# Patient Record
Sex: Female | Born: 1950 | Race: White | Hispanic: No | Marital: Married | Smoking: Current every day smoker
Health system: Southern US, Community
[De-identification: ages and names within clinical notes are randomized; demographics above are authoritative.]

## PROBLEM LIST (undated history)

## (undated) DIAGNOSIS — F419 Anxiety disorder, unspecified: Secondary | ICD-10-CM

## (undated) DIAGNOSIS — K219 Gastro-esophageal reflux disease without esophagitis: Secondary | ICD-10-CM

## (undated) DIAGNOSIS — J45909 Unspecified asthma, uncomplicated: Secondary | ICD-10-CM

## (undated) DIAGNOSIS — G629 Polyneuropathy, unspecified: Secondary | ICD-10-CM

## (undated) DIAGNOSIS — Z8719 Personal history of other diseases of the digestive system: Secondary | ICD-10-CM

## (undated) DIAGNOSIS — C679 Malignant neoplasm of bladder, unspecified: Secondary | ICD-10-CM

## (undated) DIAGNOSIS — D759 Disease of blood and blood-forming organs, unspecified: Secondary | ICD-10-CM

## (undated) DIAGNOSIS — Z9989 Dependence on other enabling machines and devices: Secondary | ICD-10-CM

## (undated) DIAGNOSIS — R52 Pain, unspecified: Secondary | ICD-10-CM

## (undated) DIAGNOSIS — C801 Malignant (primary) neoplasm, unspecified: Secondary | ICD-10-CM

## (undated) DIAGNOSIS — M1990 Unspecified osteoarthritis, unspecified site: Secondary | ICD-10-CM

## (undated) DIAGNOSIS — R410 Disorientation, unspecified: Secondary | ICD-10-CM

## (undated) DIAGNOSIS — G709 Myoneural disorder, unspecified: Secondary | ICD-10-CM

## (undated) DIAGNOSIS — F329 Major depressive disorder, single episode, unspecified: Secondary | ICD-10-CM

## (undated) DIAGNOSIS — Z9981 Dependence on supplemental oxygen: Secondary | ICD-10-CM

## (undated) DIAGNOSIS — R0601 Orthopnea: Secondary | ICD-10-CM

## (undated) DIAGNOSIS — I10 Essential (primary) hypertension: Secondary | ICD-10-CM

## (undated) DIAGNOSIS — D649 Anemia, unspecified: Secondary | ICD-10-CM

## (undated) DIAGNOSIS — J449 Chronic obstructive pulmonary disease, unspecified: Secondary | ICD-10-CM

## (undated) DIAGNOSIS — M797 Fibromyalgia: Secondary | ICD-10-CM

## (undated) DIAGNOSIS — D751 Secondary polycythemia: Secondary | ICD-10-CM

## (undated) DIAGNOSIS — F32A Depression, unspecified: Secondary | ICD-10-CM

## (undated) DIAGNOSIS — I2510 Atherosclerotic heart disease of native coronary artery without angina pectoris: Secondary | ICD-10-CM

## (undated) DIAGNOSIS — R519 Headache, unspecified: Secondary | ICD-10-CM

## (undated) DIAGNOSIS — Z95828 Presence of other vascular implants and grafts: Secondary | ICD-10-CM

## (undated) DIAGNOSIS — R42 Dizziness and giddiness: Secondary | ICD-10-CM

## (undated) DIAGNOSIS — R51 Headache: Secondary | ICD-10-CM

## (undated) DIAGNOSIS — G4730 Sleep apnea, unspecified: Secondary | ICD-10-CM

## (undated) DIAGNOSIS — J9690 Respiratory failure, unspecified, unspecified whether with hypoxia or hypercapnia: Secondary | ICD-10-CM

## (undated) DIAGNOSIS — N189 Chronic kidney disease, unspecified: Secondary | ICD-10-CM

## (undated) DIAGNOSIS — IMO0001 Reserved for inherently not codable concepts without codable children: Secondary | ICD-10-CM

## (undated) DIAGNOSIS — W3400XA Accidental discharge from unspecified firearms or gun, initial encounter: Secondary | ICD-10-CM

## (undated) HISTORY — PX: BREAST SURGERY: SHX581

## (undated) HISTORY — DX: Accidental discharge from unspecified firearms or gun, initial encounter: W34.00XA

## (undated) HISTORY — DX: Dizziness and giddiness: R42

## (undated) HISTORY — DX: Disorientation, unspecified: R41.0

## (undated) HISTORY — PX: ARM WOUND REPAIR / CLOSURE: SUR1141

## (undated) HISTORY — PX: CYSTOSTOMY W/ BLADDER BIOPSY: SHX1431

## (undated) HISTORY — PX: GRAFT APPLICATION: SHX6696

## (undated) HISTORY — PX: DILATION AND CURETTAGE OF UTERUS: SHX78

---

## 1978-08-12 HISTORY — PX: CHOLECYSTECTOMY: SHX55

## 1978-08-12 HISTORY — PX: APPENDECTOMY: SHX54

## 1978-08-12 HISTORY — PX: ABDOMINAL HYSTERECTOMY: SHX81

## 1979-08-13 DIAGNOSIS — W3400XA Accidental discharge from unspecified firearms or gun, initial encounter: Secondary | ICD-10-CM

## 1979-08-13 HISTORY — DX: Accidental discharge from unspecified firearms or gun, initial encounter: W34.00XA

## 1999-09-12 ENCOUNTER — Ambulatory Visit (HOSPITAL_COMMUNITY): Admission: RE | Admit: 1999-09-12 | Discharge: 1999-09-12 | Payer: Self-pay | Admitting: Gastroenterology

## 2002-06-11 ENCOUNTER — Ambulatory Visit (HOSPITAL_COMMUNITY): Admission: RE | Admit: 2002-06-11 | Discharge: 2002-06-11 | Payer: Self-pay | Admitting: Gastroenterology

## 2004-03-12 ENCOUNTER — Ambulatory Visit (HOSPITAL_COMMUNITY): Admission: RE | Admit: 2004-03-12 | Discharge: 2004-03-12 | Payer: Self-pay | Admitting: Gastroenterology

## 2004-05-03 ENCOUNTER — Other Ambulatory Visit: Payer: Self-pay

## 2004-06-18 ENCOUNTER — Ambulatory Visit (HOSPITAL_COMMUNITY): Admission: RE | Admit: 2004-06-18 | Discharge: 2004-06-18 | Payer: Self-pay | Admitting: Gastroenterology

## 2004-06-28 ENCOUNTER — Ambulatory Visit: Payer: Self-pay | Admitting: Internal Medicine

## 2004-07-25 ENCOUNTER — Ambulatory Visit: Payer: Self-pay | Admitting: Ophthalmology

## 2004-08-30 ENCOUNTER — Ambulatory Visit: Payer: Self-pay | Admitting: Internal Medicine

## 2005-05-14 ENCOUNTER — Encounter: Admission: RE | Admit: 2005-05-14 | Discharge: 2005-05-14 | Payer: Self-pay | Admitting: Neurosurgery

## 2005-05-30 ENCOUNTER — Inpatient Hospital Stay (HOSPITAL_COMMUNITY): Admission: RE | Admit: 2005-05-30 | Discharge: 2005-05-31 | Payer: Self-pay | Admitting: Neurosurgery

## 2005-06-20 ENCOUNTER — Encounter: Admission: RE | Admit: 2005-06-20 | Discharge: 2005-06-20 | Payer: Self-pay | Admitting: Neurosurgery

## 2007-01-13 ENCOUNTER — Ambulatory Visit: Payer: Self-pay | Admitting: Neurosurgery

## 2007-10-26 ENCOUNTER — Emergency Department: Payer: Self-pay | Admitting: Emergency Medicine

## 2007-10-29 ENCOUNTER — Other Ambulatory Visit: Payer: Self-pay

## 2007-10-29 ENCOUNTER — Inpatient Hospital Stay: Payer: Self-pay | Admitting: Rheumatology

## 2007-10-31 ENCOUNTER — Inpatient Hospital Stay: Payer: Self-pay | Admitting: Unknown Physician Specialty

## 2009-08-12 HISTORY — PX: COLONOSCOPY: SHX174

## 2010-04-05 ENCOUNTER — Ambulatory Visit: Payer: Self-pay | Admitting: Gastroenterology

## 2010-04-09 LAB — PATHOLOGY REPORT

## 2010-09-02 ENCOUNTER — Encounter: Payer: Self-pay | Admitting: Gastroenterology

## 2010-12-21 ENCOUNTER — Ambulatory Visit: Payer: Self-pay | Admitting: Internal Medicine

## 2011-01-11 ENCOUNTER — Ambulatory Visit: Payer: Self-pay | Admitting: Internal Medicine

## 2011-02-10 ENCOUNTER — Ambulatory Visit: Payer: Self-pay | Admitting: Internal Medicine

## 2011-03-13 ENCOUNTER — Ambulatory Visit: Payer: Self-pay | Admitting: Internal Medicine

## 2011-04-13 ENCOUNTER — Ambulatory Visit: Payer: Self-pay | Admitting: Internal Medicine

## 2011-04-18 ENCOUNTER — Ambulatory Visit: Payer: Self-pay | Admitting: Internal Medicine

## 2011-05-03 ENCOUNTER — Inpatient Hospital Stay: Payer: Self-pay | Admitting: Internal Medicine

## 2011-05-13 ENCOUNTER — Other Ambulatory Visit (HOSPITAL_COMMUNITY): Payer: Self-pay | Admitting: Neurological Surgery

## 2011-05-13 DIAGNOSIS — M419 Scoliosis, unspecified: Secondary | ICD-10-CM

## 2011-05-22 ENCOUNTER — Ambulatory Visit: Payer: Self-pay | Admitting: Internal Medicine

## 2011-06-12 ENCOUNTER — Ambulatory Visit (HOSPITAL_COMMUNITY)
Admission: RE | Admit: 2011-06-12 | Discharge: 2011-06-12 | Disposition: A | Discharge status: Home / Self Care | Payer: Medicare Other | Source: Ambulatory Visit | Attending: Neurological Surgery | Admitting: Neurological Surgery

## 2011-06-12 DIAGNOSIS — Z01818 Encounter for other preprocedural examination: Secondary | ICD-10-CM | POA: Insufficient documentation

## 2011-06-12 DIAGNOSIS — M419 Scoliosis, unspecified: Secondary | ICD-10-CM

## 2011-06-12 MED ORDER — IOHEXOL 180 MG/ML  SOLN
15.0000 mL | Freq: Once | INTRAMUSCULAR | Status: AC | PRN
  Administered 2011-06-12: 15 mL via INTRATHECAL

## 2011-06-13 ENCOUNTER — Ambulatory Visit: Payer: Self-pay | Admitting: Internal Medicine

## 2011-07-19 ENCOUNTER — Ambulatory Visit: Payer: Self-pay | Admitting: Internal Medicine

## 2011-08-08 ENCOUNTER — Ambulatory Visit: Payer: Self-pay | Admitting: Urology

## 2011-08-13 ENCOUNTER — Ambulatory Visit: Payer: Self-pay | Admitting: Internal Medicine

## 2011-10-29 ENCOUNTER — Ambulatory Visit: Payer: Self-pay | Admitting: Internal Medicine

## 2011-11-11 ENCOUNTER — Ambulatory Visit: Payer: Self-pay | Admitting: Internal Medicine

## 2012-01-07 ENCOUNTER — Ambulatory Visit: Payer: Self-pay | Admitting: Internal Medicine

## 2012-01-07 LAB — URINALYSIS, COMPLETE
Bacteria: NONE SEEN
Bilirubin,UR: NEGATIVE
Blood: NEGATIVE
Glucose,UR: NEGATIVE mg/dL (ref 0–75)
Ketone: NEGATIVE
Leukocyte Esterase: NEGATIVE
Nitrite: NEGATIVE
Ph: 6 (ref 4.5–8.0)
Protein: NEGATIVE
RBC,UR: 1 /HPF (ref 0–5)
Specific Gravity: 1.004 (ref 1.003–1.030)
Squamous Epithelial: 1
Transitional Epi: <1
WBC UR: <1 /HPF (ref 0–5)

## 2012-01-07 LAB — CBC CANCER CENTER
Basophil #: 0.1 x10 3/mm (ref 0.0–0.1)
Basophil %: 0.6 %
Eosinophil #: 0.2 x10 3/mm (ref 0.0–0.7)
Eosinophil %: 1.4 %
HCT: 48.4 % — ABNORMAL HIGH (ref 35.0–47.0)
HGB: 15.8 g/dL (ref 12.0–16.0)
Lymphocyte #: 4.8 x10 3/mm — ABNORMAL HIGH (ref 1.0–3.6)
Lymphocyte %: 36.9 %
MCH: 28.2 pg (ref 26.0–34.0)
MCHC: 32.7 g/dL (ref 32.0–36.0)
MCV: 86 fL (ref 80–100)
Monocyte #: 1.1 x10 3/mm — ABNORMAL HIGH (ref 0.2–0.9)
Monocyte %: 8.3 %
Neutrophil #: 6.8 x10 3/mm — ABNORMAL HIGH (ref 1.4–6.5)
Neutrophil %: 52.8 %
Platelet: 359 x10 3/mm (ref 150–440)
RBC: 5.61 10*6/uL — ABNORMAL HIGH (ref 3.80–5.20)
RDW: 16.2 % — ABNORMAL HIGH (ref 11.5–14.5)
WBC: 12.9 x10 3/mm — ABNORMAL HIGH (ref 3.6–11.0)

## 2012-01-11 ENCOUNTER — Ambulatory Visit: Payer: Self-pay | Admitting: Internal Medicine

## 2012-01-15 ENCOUNTER — Ambulatory Visit: Payer: Self-pay | Admitting: Gastroenterology

## 2012-02-14 ENCOUNTER — Ambulatory Visit: Payer: Self-pay | Admitting: Gastroenterology

## 2012-02-19 LAB — PATHOLOGY REPORT

## 2012-04-27 ENCOUNTER — Emergency Department: Payer: Self-pay | Admitting: Emergency Medicine

## 2012-04-27 LAB — URINALYSIS, COMPLETE
Bacteria: NONE SEEN
Bilirubin,UR: NEGATIVE
Glucose,UR: NEGATIVE mg/dL (ref 0–75)
Hyaline Cast: 1
Leukocyte Esterase: NEGATIVE
Nitrite: NEGATIVE
Ph: 7 (ref 4.5–8.0)
Protein: 100
RBC,UR: 23 /HPF (ref 0–5)
Specific Gravity: 1.015 (ref 1.003–1.030)
Squamous Epithelial: <1
WBC UR: 2 /HPF (ref 0–5)

## 2012-04-27 LAB — COMPREHENSIVE METABOLIC PANEL
Albumin: 3.4 g/dL (ref 3.4–5.0)
Alkaline Phosphatase: 103 U/L (ref 50–136)
Anion Gap: 8 (ref 7–16)
BUN: 5 mg/dL — ABNORMAL LOW (ref 7–18)
Bilirubin,Total: 0.5 mg/dL (ref 0.2–1.0)
Calcium, Total: 9.3 mg/dL (ref 8.5–10.1)
Chloride: 93 mmol/L — ABNORMAL LOW (ref 98–107)
Co2: 27 mmol/L (ref 21–32)
Creatinine: 0.5 mg/dL — ABNORMAL LOW (ref 0.60–1.30)
EGFR (African American): >60
EGFR (Non-African Amer.): >60
Glucose: 113 mg/dL — ABNORMAL HIGH (ref 65–99)
Osmolality: 255 (ref 275–301)
Potassium: 3.8 mmol/L (ref 3.5–5.1)
SGOT(AST): 17 U/L (ref 15–37)
SGPT (ALT): 12 U/L (ref 12–78)
Sodium: 128 mmol/L — ABNORMAL LOW (ref 136–145)
Total Protein: 7.8 g/dL (ref 6.4–8.2)

## 2012-04-27 LAB — CK TOTAL AND CKMB (NOT AT ARMC)
CK, Total: 77 U/L (ref 21–215)
CK-MB: 2.4 ng/mL (ref 0.5–3.6)

## 2012-04-27 LAB — CBC
HCT: 42.1 % (ref 35.0–47.0)
HGB: 14.3 g/dL (ref 12.0–16.0)
MCH: 27.7 pg (ref 26.0–34.0)
MCHC: 33.9 g/dL (ref 32.0–36.0)
MCV: 82 fL (ref 80–100)
Platelet: 399 10*3/uL (ref 150–440)
RBC: 5.15 10*6/uL (ref 3.80–5.20)
RDW: 16.6 % — ABNORMAL HIGH (ref 11.5–14.5)
WBC: 15.6 10*3/uL — ABNORMAL HIGH (ref 3.6–11.0)

## 2012-04-27 LAB — TROPONIN I: Troponin-I: <0.02 ng/mL

## 2012-04-27 LAB — LIPASE, BLOOD: Lipase: 83 U/L (ref 73–393)

## 2012-07-06 ENCOUNTER — Ambulatory Visit: Payer: Self-pay | Admitting: Otolaryngology

## 2012-07-22 ENCOUNTER — Ambulatory Visit: Payer: Self-pay

## 2012-08-10 ENCOUNTER — Ambulatory Visit: Payer: Self-pay | Admitting: Otolaryngology

## 2012-08-19 ENCOUNTER — Ambulatory Visit: Payer: Self-pay | Admitting: Otolaryngology

## 2013-02-17 ENCOUNTER — Ambulatory Visit: Payer: Self-pay | Admitting: Ophthalmology

## 2013-05-28 ENCOUNTER — Other Ambulatory Visit: Payer: Self-pay

## 2013-06-02 LAB — CULTURE, BLOOD (SINGLE)

## 2013-06-11 ENCOUNTER — Ambulatory Visit: Payer: Self-pay | Admitting: Internal Medicine

## 2013-06-11 LAB — CBC CANCER CENTER
Basophil #: 0.1 x10 3/mm (ref 0.0–0.1)
Basophil %: 0.8 %
Eosinophil #: 0.3 x10 3/mm (ref 0.0–0.7)
Eosinophil %: 2.2 %
HCT: 50.3 % — ABNORMAL HIGH (ref 35.0–47.0)
HGB: 15.9 g/dL (ref 12.0–16.0)
Lymphocyte #: 3.8 x10 3/mm — ABNORMAL HIGH (ref 1.0–3.6)
Lymphocyte %: 27 %
MCH: 23.9 pg — ABNORMAL LOW (ref 26.0–34.0)
MCHC: 31.6 g/dL — ABNORMAL LOW (ref 32.0–36.0)
MCV: 76 fL — ABNORMAL LOW (ref 80–100)
Monocyte #: 1.2 x10 3/mm — ABNORMAL HIGH (ref 0.2–0.9)
Monocyte %: 8.8 %
Neutrophil #: 8.6 x10 3/mm — ABNORMAL HIGH (ref 1.4–6.5)
Neutrophil %: 61.2 %
Platelet: 359 x10 3/mm (ref 150–440)
RBC: 6.66 10*6/uL — ABNORMAL HIGH (ref 3.80–5.20)
RDW: 20.2 % — ABNORMAL HIGH (ref 11.5–14.5)
WBC: 14 x10 3/mm — ABNORMAL HIGH (ref 3.6–11.0)

## 2013-06-12 ENCOUNTER — Ambulatory Visit: Payer: Self-pay | Admitting: Internal Medicine

## 2013-06-30 LAB — CBC CANCER CENTER
Basophil #: 0.1 x10 3/mm (ref 0.0–0.1)
Basophil %: 0.7 %
Eosinophil #: 0.1 x10 3/mm (ref 0.0–0.7)
Eosinophil %: 0.7 %
HCT: 47.4 % — ABNORMAL HIGH (ref 35.0–47.0)
HGB: 15.2 g/dL (ref 12.0–16.0)
Lymphocyte #: 3.4 x10 3/mm (ref 1.0–3.6)
Lymphocyte %: 20.8 %
MCH: 24.3 pg — ABNORMAL LOW (ref 26.0–34.0)
MCHC: 32.1 g/dL (ref 32.0–36.0)
MCV: 76 fL — ABNORMAL LOW (ref 80–100)
Monocyte #: 1.4 x10 3/mm — ABNORMAL HIGH (ref 0.2–0.9)
Monocyte %: 8.6 %
Neutrophil #: 11.4 x10 3/mm — ABNORMAL HIGH (ref 1.4–6.5)
Neutrophil %: 69.2 %
Platelet: 372 x10 3/mm (ref 150–440)
RBC: 6.27 10*6/uL — ABNORMAL HIGH (ref 3.80–5.20)
RDW: 20.4 % — ABNORMAL HIGH (ref 11.5–14.5)
WBC: 16.5 x10 3/mm — ABNORMAL HIGH (ref 3.6–11.0)

## 2013-07-14 ENCOUNTER — Ambulatory Visit: Payer: Self-pay | Admitting: Internal Medicine

## 2013-08-12 ENCOUNTER — Ambulatory Visit: Payer: Self-pay | Admitting: Internal Medicine

## 2013-09-13 ENCOUNTER — Ambulatory Visit: Payer: Self-pay | Admitting: Internal Medicine

## 2014-01-22 DIAGNOSIS — Z8669 Personal history of other diseases of the nervous system and sense organs: Secondary | ICD-10-CM | POA: Insufficient documentation

## 2014-01-22 DIAGNOSIS — F329 Major depressive disorder, single episode, unspecified: Secondary | ICD-10-CM | POA: Insufficient documentation

## 2014-01-22 DIAGNOSIS — R739 Hyperglycemia, unspecified: Secondary | ICD-10-CM | POA: Insufficient documentation

## 2014-01-22 DIAGNOSIS — F419 Anxiety disorder, unspecified: Secondary | ICD-10-CM

## 2014-01-22 DIAGNOSIS — E785 Hyperlipidemia, unspecified: Secondary | ICD-10-CM | POA: Insufficient documentation

## 2014-01-22 DIAGNOSIS — J449 Chronic obstructive pulmonary disease, unspecified: Secondary | ICD-10-CM | POA: Insufficient documentation

## 2014-01-22 DIAGNOSIS — Z87448 Personal history of other diseases of urinary system: Secondary | ICD-10-CM | POA: Insufficient documentation

## 2014-01-22 DIAGNOSIS — K529 Noninfective gastroenteritis and colitis, unspecified: Secondary | ICD-10-CM | POA: Insufficient documentation

## 2014-01-22 DIAGNOSIS — Z87718 Personal history of other specified (corrected) congenital malformations of genitourinary system: Secondary | ICD-10-CM | POA: Insufficient documentation

## 2014-01-22 DIAGNOSIS — D72829 Elevated white blood cell count, unspecified: Secondary | ICD-10-CM | POA: Insufficient documentation

## 2014-01-22 DIAGNOSIS — M9979 Connective tissue and disc stenosis of intervertebral foramina of abdomen and other regions: Secondary | ICD-10-CM | POA: Insufficient documentation

## 2014-01-22 DIAGNOSIS — M069 Rheumatoid arthritis, unspecified: Secondary | ICD-10-CM | POA: Insufficient documentation

## 2014-01-22 DIAGNOSIS — F32A Depression, unspecified: Secondary | ICD-10-CM | POA: Insufficient documentation

## 2014-02-17 ENCOUNTER — Inpatient Hospital Stay: Payer: Self-pay | Admitting: Internal Medicine

## 2014-02-17 LAB — CBC
HCT: 39 % (ref 35.0–47.0)
HGB: 12.6 g/dL (ref 12.0–16.0)
MCH: 24.2 pg — ABNORMAL LOW (ref 26.0–34.0)
MCHC: 32.3 g/dL (ref 32.0–36.0)
MCV: 75 fL — ABNORMAL LOW (ref 80–100)
Platelet: 378 10*3/uL (ref 150–440)
RBC: 5.19 10*6/uL (ref 3.80–5.20)
RDW: 18.5 % — ABNORMAL HIGH (ref 11.5–14.5)
WBC: 17.4 10*3/uL — ABNORMAL HIGH (ref 3.6–11.0)

## 2014-02-17 LAB — COMPREHENSIVE METABOLIC PANEL
Albumin: 2.5 g/dL — ABNORMAL LOW (ref 3.4–5.0)
Alkaline Phosphatase: 107 U/L
Anion Gap: 10 (ref 7–16)
BUN: 28 mg/dL — ABNORMAL HIGH (ref 7–18)
Bilirubin,Total: 0.3 mg/dL (ref 0.2–1.0)
Calcium, Total: 8.8 mg/dL (ref 8.5–10.1)
Chloride: 88 mmol/L — ABNORMAL LOW (ref 98–107)
Co2: 24 mmol/L (ref 21–32)
Creatinine: 1.61 mg/dL — ABNORMAL HIGH (ref 0.60–1.30)
EGFR (African American): 39 — ABNORMAL LOW
EGFR (Non-African Amer.): 33 — ABNORMAL LOW
Glucose: 110 mg/dL — ABNORMAL HIGH (ref 65–99)
Osmolality: 252 (ref 275–301)
Potassium: 4.6 mmol/L (ref 3.5–5.1)
SGOT(AST): 20 U/L (ref 15–37)
SGPT (ALT): 12 U/L (ref 12–78)
Sodium: 122 mmol/L — ABNORMAL LOW (ref 136–145)
Total Protein: 7.3 g/dL (ref 6.4–8.2)

## 2014-02-17 LAB — PROTIME-INR
INR: 1.1
Prothrombin Time: 13.9 secs (ref 11.5–14.7)

## 2014-02-17 LAB — PRO B NATRIURETIC PEPTIDE: B-Type Natriuretic Peptide: 19243 pg/mL — ABNORMAL HIGH (ref 0–125)

## 2014-02-17 LAB — CK-MB
CK-MB: 4.9 ng/mL — ABNORMAL HIGH (ref 0.5–3.6)
CK-MB: 3.6 ng/mL (ref 0.5–3.6)

## 2014-02-17 LAB — TROPONIN I
Troponin-I: 0.88 ng/mL — ABNORMAL HIGH
Troponin-I: 0.94 ng/mL — ABNORMAL HIGH

## 2014-02-17 LAB — CK TOTAL AND CKMB (NOT AT ARMC)
CK, Total: 164 U/L
CK-MB: 4.8 ng/mL — ABNORMAL HIGH (ref 0.5–3.6)

## 2014-02-17 LAB — APTT: Activated PTT: 38.8 secs — ABNORMAL HIGH (ref 23.6–35.9)

## 2014-02-18 LAB — COMPREHENSIVE METABOLIC PANEL
Albumin: 2.3 g/dL — ABNORMAL LOW (ref 3.4–5.0)
Alkaline Phosphatase: 94 U/L
Anion Gap: 7 (ref 7–16)
BILIRUBIN TOTAL: 0.5 mg/dL (ref 0.2–1.0)
BUN: 25 mg/dL — ABNORMAL HIGH (ref 7–18)
CO2: 29 mmol/L (ref 21–32)
Calcium, Total: 8.4 mg/dL — ABNORMAL LOW (ref 8.5–10.1)
Chloride: 89 mmol/L — ABNORMAL LOW (ref 98–107)
Creatinine: 1.22 mg/dL (ref 0.60–1.30)
EGFR (African American): 54 — ABNORMAL LOW
EGFR (Non-African Amer.): 47 — ABNORMAL LOW
Glucose: 124 mg/dL — ABNORMAL HIGH (ref 65–99)
OSMOLALITY: 257 (ref 275–301)
Potassium: 3.9 mmol/L (ref 3.5–5.1)
SGOT(AST): 14 U/L — ABNORMAL LOW (ref 15–37)
SGPT (ALT): 10 U/L — ABNORMAL LOW (ref 12–78)
SODIUM: 125 mmol/L — AB (ref 136–145)
TOTAL PROTEIN: 6.7 g/dL (ref 6.4–8.2)

## 2014-02-18 LAB — CBC WITH DIFFERENTIAL/PLATELET
BASOS ABS: 0.1 10*3/uL (ref 0.0–0.1)
Basophil %: 0.4 %
EOS ABS: 0.1 10*3/uL (ref 0.0–0.7)
Eosinophil %: 0.8 %
HCT: 39 % (ref 35.0–47.0)
HGB: 12.2 g/dL (ref 12.0–16.0)
Lymphocyte #: 2.2 10*3/uL (ref 1.0–3.6)
Lymphocyte %: 17.4 %
MCH: 23.8 pg — ABNORMAL LOW (ref 26.0–34.0)
MCHC: 31.4 g/dL — AB (ref 32.0–36.0)
MCV: 76 fL — AB (ref 80–100)
Monocyte #: 1.8 x10 3/mm — ABNORMAL HIGH (ref 0.2–0.9)
Monocyte %: 14.1 %
NEUTROS PCT: 67.3 %
Neutrophil #: 8.6 10*3/uL — ABNORMAL HIGH (ref 1.4–6.5)
Platelet: 349 10*3/uL (ref 150–440)
RBC: 5.15 10*6/uL (ref 3.80–5.20)
RDW: 18.4 % — ABNORMAL HIGH (ref 11.5–14.5)
WBC: 12.8 10*3/uL — AB (ref 3.6–11.0)

## 2014-02-18 LAB — HEPARIN LEVEL (UNFRACTIONATED)
Anti-Xa(Unfractionated): <0.1 IU/mL — ABNORMAL LOW (ref 0.30–0.70)
Anti-Xa(Unfractionated): 0.22 IU/mL — ABNORMAL LOW (ref 0.30–0.70)

## 2014-02-18 LAB — CK-MB: CK-MB: 3.5 ng/mL (ref 0.5–3.6)

## 2014-02-19 LAB — BASIC METABOLIC PANEL
Anion Gap: 8 (ref 7–16)
BUN: 14 mg/dL (ref 7–18)
CALCIUM: 8.6 mg/dL (ref 8.5–10.1)
Chloride: 90 mmol/L — ABNORMAL LOW (ref 98–107)
Co2: 28 mmol/L (ref 21–32)
Creatinine: 0.71 mg/dL (ref 0.60–1.30)
GLUCOSE: 117 mg/dL — AB (ref 65–99)
OSMOLALITY: 255 (ref 275–301)
Potassium: 4.3 mmol/L (ref 3.5–5.1)
Sodium: 126 mmol/L — ABNORMAL LOW (ref 136–145)

## 2014-02-19 LAB — CBC WITH DIFFERENTIAL/PLATELET
BASOS ABS: 0 10*3/uL (ref 0.0–0.1)
Basophil %: 0.1 %
EOS ABS: 0 10*3/uL (ref 0.0–0.7)
EOS PCT: 0 %
HCT: 36.7 % (ref 35.0–47.0)
HGB: 12 g/dL (ref 12.0–16.0)
LYMPHS ABS: 1 10*3/uL (ref 1.0–3.6)
Lymphocyte %: 5.1 %
MCH: 24.1 pg — ABNORMAL LOW (ref 26.0–34.0)
MCHC: 32.6 g/dL (ref 32.0–36.0)
MCV: 74 fL — ABNORMAL LOW (ref 80–100)
MONOS PCT: 5.2 %
Monocyte #: 1 x10 3/mm — ABNORMAL HIGH (ref 0.2–0.9)
NEUTROS ABS: 17.3 10*3/uL — AB (ref 1.4–6.5)
Neutrophil %: 89.6 %
Platelet: 398 10*3/uL (ref 150–440)
RBC: 4.97 10*6/uL (ref 3.80–5.20)
RDW: 18.7 % — ABNORMAL HIGH (ref 11.5–14.5)
WBC: 19.3 10*3/uL — ABNORMAL HIGH (ref 3.6–11.0)

## 2014-02-20 LAB — BASIC METABOLIC PANEL
Anion Gap: 6 — ABNORMAL LOW (ref 7–16)
BUN: 8 mg/dL (ref 7–18)
CALCIUM: 8.2 mg/dL — AB (ref 8.5–10.1)
CHLORIDE: 93 mmol/L — AB (ref 98–107)
Co2: 30 mmol/L (ref 21–32)
Creatinine: 0.73 mg/dL (ref 0.60–1.30)
EGFR (African American): >60
EGFR (Non-African Amer.): >60
GLUCOSE: 118 mg/dL — AB (ref 65–99)
Osmolality: 258 (ref 275–301)
Potassium: 4.7 mmol/L (ref 3.5–5.1)
Sodium: 129 mmol/L — ABNORMAL LOW (ref 136–145)

## 2014-02-20 LAB — CBC WITH DIFFERENTIAL/PLATELET
Basophil #: 0 10*3/uL (ref 0.0–0.1)
Basophil %: 0.2 %
Eosinophil #: 0 10*3/uL (ref 0.0–0.7)
Eosinophil %: 0 %
HCT: 38.1 % (ref 35.0–47.0)
HGB: 12.3 g/dL (ref 12.0–16.0)
Lymphocyte #: 1.2 10*3/uL (ref 1.0–3.6)
Lymphocyte %: 6.2 %
MCH: 23.8 pg — AB (ref 26.0–34.0)
MCHC: 32.3 g/dL (ref 32.0–36.0)
MCV: 74 fL — AB (ref 80–100)
MONO ABS: 1.2 x10 3/mm — AB (ref 0.2–0.9)
MONOS PCT: 6.3 %
NEUTROS ABS: 16.6 10*3/uL — AB (ref 1.4–6.5)
Neutrophil %: 87.3 %
Platelet: 412 10*3/uL (ref 150–440)
RBC: 5.18 10*6/uL (ref 3.80–5.20)
RDW: 18.8 % — ABNORMAL HIGH (ref 11.5–14.5)
WBC: 19.1 10*3/uL — ABNORMAL HIGH (ref 3.6–11.0)

## 2014-02-21 LAB — CBC WITH DIFFERENTIAL/PLATELET
Bands: 4 %
HCT: 41 % (ref 35.0–47.0)
HGB: 12.8 g/dL (ref 12.0–16.0)
LYMPHS PCT: 10 %
MCH: 23.3 pg — AB (ref 26.0–34.0)
MCHC: 31.2 g/dL — AB (ref 32.0–36.0)
MCV: 75 fL — ABNORMAL LOW (ref 80–100)
MONOS PCT: 10 %
Metamyelocyte: 3 %
Myelocyte: 2 %
Platelet: 446 10*3/uL — ABNORMAL HIGH (ref 150–440)
RBC: 5.5 10*6/uL — ABNORMAL HIGH (ref 3.80–5.20)
RDW: 18.7 % — AB (ref 11.5–14.5)
Segmented Neutrophils: 71 %
WBC: 16.2 10*3/uL — ABNORMAL HIGH (ref 3.6–11.0)

## 2014-02-21 LAB — BASIC METABOLIC PANEL
Anion Gap: 5 — ABNORMAL LOW (ref 7–16)
BUN: 9 mg/dL (ref 7–18)
CALCIUM: 8.6 mg/dL (ref 8.5–10.1)
CREATININE: 0.72 mg/dL (ref 0.60–1.30)
Chloride: 92 mmol/L — ABNORMAL LOW (ref 98–107)
Co2: 33 mmol/L — ABNORMAL HIGH (ref 21–32)
EGFR (Non-African Amer.): >60
GLUCOSE: 116 mg/dL — AB (ref 65–99)
Osmolality: 260 (ref 275–301)
Potassium: 4.4 mmol/L (ref 3.5–5.1)
SODIUM: 130 mmol/L — AB (ref 136–145)

## 2014-02-21 LAB — EXPECTORATED SPUTUM ASSESSMENT W REFEX TO RESP CULTURE

## 2014-02-22 LAB — BASIC METABOLIC PANEL
Anion Gap: 7 (ref 7–16)
BUN: 14 mg/dL (ref 7–18)
CHLORIDE: 89 mmol/L — AB (ref 98–107)
CREATININE: 0.75 mg/dL (ref 0.60–1.30)
Calcium, Total: 9.1 mg/dL (ref 8.5–10.1)
Co2: 34 mmol/L — ABNORMAL HIGH (ref 21–32)
EGFR (Non-African Amer.): >60
Glucose: 127 mg/dL — ABNORMAL HIGH (ref 65–99)
Osmolality: 263 (ref 275–301)
POTASSIUM: 4.1 mmol/L (ref 3.5–5.1)
Sodium: 130 mmol/L — ABNORMAL LOW (ref 136–145)

## 2014-02-22 LAB — CULTURE, BLOOD (SINGLE)

## 2014-02-22 LAB — CBC WITH DIFFERENTIAL/PLATELET
BANDS NEUTROPHIL: 9 %
Comment - H1-Com5: NORMAL
HCT: 43 % (ref 35.0–47.0)
HGB: 13.5 g/dL (ref 12.0–16.0)
LYMPHS PCT: 12 %
MCH: 23.3 pg — AB (ref 26.0–34.0)
MCHC: 31.3 g/dL — ABNORMAL LOW (ref 32.0–36.0)
MCV: 74 fL — ABNORMAL LOW (ref 80–100)
Metamyelocyte: 1 %
Monocytes: 7 %
PLATELETS: 447 10*3/uL — AB (ref 150–440)
RBC: 5.78 10*6/uL — ABNORMAL HIGH (ref 3.80–5.20)
RDW: 19.2 % — AB (ref 11.5–14.5)
SEGMENTED NEUTROPHILS: 71 %
WBC: 17.6 10*3/uL — AB (ref 3.6–11.0)

## 2014-05-27 ENCOUNTER — Ambulatory Visit: Payer: Self-pay | Admitting: Specialist

## 2014-07-06 ENCOUNTER — Encounter: Payer: Self-pay | Admitting: Internal Medicine

## 2014-08-22 DIAGNOSIS — M810 Age-related osteoporosis without current pathological fracture: Secondary | ICD-10-CM | POA: Insufficient documentation

## 2014-10-31 ENCOUNTER — Emergency Department: Payer: Self-pay | Admitting: Emergency Medicine

## 2014-11-14 ENCOUNTER — Ambulatory Visit
Admit: 2014-11-14 | Disposition: A | Discharge status: Home / Self Care | Payer: Self-pay | Attending: Urology | Admitting: Urology

## 2014-11-29 ENCOUNTER — Ambulatory Visit
Admit: 2014-11-29 | Disposition: A | Discharge status: Home / Self Care | Payer: Self-pay | Attending: Urology | Admitting: Urology

## 2014-11-29 LAB — CBC WITH DIFFERENTIAL/PLATELET
Basophil #: 0 10*3/uL (ref 0.0–0.1)
Basophil %: 0.4 %
EOS PCT: 2.5 %
Eosinophil #: 0.3 10*3/uL (ref 0.0–0.7)
HCT: 42.4 % (ref 35.0–47.0)
HGB: 14 g/dL (ref 12.0–16.0)
LYMPHS ABS: 3.6 10*3/uL (ref 1.0–3.6)
LYMPHS PCT: 35.8 %
MCH: 29.2 pg (ref 26.0–34.0)
MCHC: 33 g/dL (ref 32.0–36.0)
MCV: 89 fL (ref 80–100)
MONOS PCT: 9 %
Monocyte #: 0.9 x10 3/mm (ref 0.2–0.9)
Neutrophil #: 5.2 10*3/uL (ref 1.4–6.5)
Neutrophil %: 52.3 %
Platelet: 260 10*3/uL (ref 150–440)
RBC: 4.79 10*6/uL (ref 3.80–5.20)
RDW: 15.3 % — ABNORMAL HIGH (ref 11.5–14.5)
WBC: 10 10*3/uL (ref 3.6–11.0)

## 2014-12-02 NOTE — Op Note (Signed)
PATIENT NAME:  Rebecca Holland, Rebecca Holland MR#:  [id number] DATE OF BIRTH:  [birth year]  DATE OF PROCEDURE:  08/19/2012  PREOPERATIVE DIAGNOSIS:  Large left medial canthal nose surgery defect (2.5 x 2.0 cm).   POSTOPERATIVE DIAGNOSIS:  Large left medial canthal nose surgery defect (2.5 x 2.0 cm).   PROCEDURES:   1.  Full-thickness skin graft from the left supraclavicular fossa to the left medial canthal defect.  2.  Complex closure of left supraclavicular fossa.   SURGEON:  Janalee Dane, MD   DESCRIPTION OF PROCEDURE:  The patient was placed in the supine position on the operating room table. After general anesthesia had been induced, the patient was turned 90 degrees clockwise from anesthesia. The defect was marked with a template and then template was used to mark the harvest site in the left supraclavicular fossa. The areas were then prepped and draped in the usual fashion. A #15 blade was used to raise the full-thickness skin graft. The skin graft was placed in saline and undermining was carried out superiorly and inferiorly for approximately 2 cm in each direction. The wound was then closed with subdermal 4-0 Vicryl and the skin was closed with 6-0 nylon suture.   The full-thickness skin graft was then carefully defatted to thin it out as much as possible. The graft was then pie crusted with an 11 blade and sculpted to fit the defect including the medial aspect of the upper and lower eyelids. The graft was then carefully and meticulously sutured into place with interrupted 6-0 nylon and 5-0 silk sutures left long. The silk sutures were then tied over a Xeroform bolster and the left supraclavicular fossa wound was then dressed with bacitracin. The patient was then returned to anesthesia, allowed to emerge from anesthesia in the operating room and taken to the recovery room in stable condition. There were no complications. Estimated blood loss was 15 mL.    ____________________________ J. Nadeen Landau, MD jmc:si D: 08/20/2012 20:51:31 ET T: 08/20/2012 23:31:48 ET JOB#: [id number]  cc: Janalee Dane, MD, <Dictator> Nicholos Johns MD ELECTRONICALLY SIGNED 09/22/2012 19:22
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
## 2014-12-03 NOTE — Consult Note (Signed)
Chief Complaint:  Subjective/Chief Complaint Breathing better today. S/P fall last PM   VITAL SIGNS/ANCILLARY NOTES: **Vital Signs.:   11-Jul-15 08:03  Vital Signs Type Q 4hr  Temperature Temperature (F) 98.3  Celsius 36.8  Temperature Source oral  Pulse Pulse 93  Respirations Respirations 18  Systolic BP Systolic BP 130  Diastolic BP (mmHg) Diastolic BP (mmHg) 54  Mean BP 73  Pulse Ox % Pulse Ox % 93  Pulse Ox Activity Level  At rest  Oxygen Delivery 4L  *Intake and Output.:   11-Jul-15 12:41  Grand Totals Intake:  600 Output:      Net:  600 24 Hr.:  -115  IV (Primary)      In:  500  IV (Secondary)      In:  100   Brief Assessment:  GEN well developed, well nourished, no acute distress   Cardiac Regular   Respiratory normal resp effort  rhonchi   Gastrointestinal Normal   Gastrointestinal details normal Soft   Lab Results: Hepatic:  10-Jul-15 00:38   Bilirubin, Total 0.5  Alkaline Phosphatase 94 (45-117 NOTE: New Reference Range 07/02/13)  SGPT (ALT)  10  SGOT (AST)  14  Total Protein, Serum 6.7  Albumin, Serum  2.3  Routine Micro:  10-Jul-15 13:07   Micro Text Report SPUTUM CULTURE   COMMENT                   COLONIES TOO SMALL TO READ   GRAM STAIN                EXCELLENT SPECIMEN-90-100% WBC   GRAM STAIN                MANY WHITE BLOOD CELLS   GRAM STAIN                FEW GRAM VARIABLE RODS   GRAM STAIN             RARE GRAM POSITIVE COCCI IN PAIRS   ANTIBIOTIC                       Culture Comment COLONIES TOO SMALL TO READ  Gram Stain 1 EXCELLENT SPECIMEN-90-100% WBC  Gram Stain 2 MANY WHITE BLOOD CELLS  Gram Stain 3 FEW GRAM VARIABLE RODS  Gram Stain 4 RARE GRAM POSITIVE COCCI IN PAIRS  Result(s) reported on 19 Feb 2014 at 03:26PM.  General Ref:  10-Jul-15 09:20   Antithrombin III ========== TEST NAME ==========  ========= RESULTS =========  = REFERENCE RANGE =  ANTITHROMBIN III  Antithrombin III, Func/Immunol Antithrombin Activity            [   98 %                 ]            75-135 Antithrombin Antigen            [L59 %                 ]            75-130 A deficiency of antithrombin (AT), either congenital or acquired, increases the risk of thromboembolism. Congenital deficiencies of AT are very rare; acquired AT deficiency is much more common. Heparin therapy will lower AT levels. Levels are diminished in patients with disseminated intravascular coagulation (DIC) or sepsis and can be transiently diminished after an acute thrombotic event or AT deficiency can occur due  to diminished synthesis in patients with malnutrition and severe liver disease and urinary loss in nephrotic syndrome. Inflammatory bowel disease and drug therapy with L-asparaginse or fluorouracil can also produce diminished AT levels. It has been suggested that repeat blood sampling and testing after ruling out acquired causes of deficiency should be performed before the patient is diagnosed with congenital AT deficiency. **[facility]            No: [id number]         [address], [place],  [address]           Lindon Romp, MD         [phone]   Result(s) reported on 19 Feb 2014 at 05:48PM.  Routine Chem:  10-Jul-15 00:38   Glucose, Serum  124  BUN  25  Creatinine (comp) 1.22  Potassium, Serum 3.9  Chloride, Serum  89  CO2, Serum 29  Calcium (Total), Serum  8.4  Anion Gap 7  Osmolality (calc) 257  eGFR (African American)  54  eGFR (Non-African American)  47 (eGFR values <76m/min/1.73 m2 may be an indication of chronic kidney disease (CKD). Calculated eGFR is useful in patients with stable renal function. The eGFR calculation will not be reliable in acutely ill patients when serum creatinine is changing rapidly. It is not useful in  patients on dialysis. The eGFR calculation may not be applicable to patients at the low and high extremes of body sizes,  pregnant women, and vegetarians.)  Routine Hem:  10-Jul-15 00:38   WBC (CBC)  12.8  RBC (CBC) 5.15  Hemoglobin (CBC) 12.2  Hematocrit (CBC) 39.0  Platelet Count (CBC) 349  MCV  76  MCH  23.8  MCHC  31.4  RDW  18.4  Neutrophil % 67.3  Lymphocyte % 17.4  Monocyte % 14.1  Eosinophil % 0.8  Basophil % 0.4  Neutrophil #  8.6  Lymphocyte # 2.2  Monocyte #  1.8  Eosinophil # 0.1  Basophil # 0.1 (Result(s) reported on 18 Feb 2014 at 01:05AM.)   Radiology Results: XRay:    09-Jul-15 15:52, Chest Portable Single View  Chest Portable Single View   REASON FOR EXAM:    Chest Pain  COMMENTS:       PROCEDURE: DXR - DXR PORTABLE CHEST SINGLE VIEW  - Feb 17 2014  3:52PM     CLINICAL DATA:  Chest pain and shortness of breath.    EXAM:  PORTABLE CHEST - 1 VIEW    COMPARISON:  PA and lateral chest 05/06/2011.    FINDINGS:  The chest is hyperexpanded. There is no focal airspace disease or  effusion. Heart size is mildly enlarged.   IMPRESSION:  No acute finding.    Mild cardiomegaly.    Pulmonary hyperexpansion suggests with emphysema.      Electronically Signed    By: TInge RiseM.D.    On: 02/17/2014 15:55         Verified By: TRamond Dial M.D.,    11-Jul-15 06:31, Chest Portable Single View  Chest Portable Single View   REASON FOR EXAM:    SOB  COMMENTS:       PROCEDURE: DXR - DXR PORTABLE CHEST SINGLE VIEW  - Feb 19 2014  6:31AM     CLINICAL DATA:  Shortness of breath.    EXAM:  PORTABLE CHEST - 1 VIEW  COMPARISON:  Chest x-ray and chest CT dated 02/17/2014    FINDINGS:  There has been significant improvement in the consolidation in the  right lower lobe posterior medially. There is slight residual  atelectasis and infiltrate in the right lower lobe. The patient now  has hazy infiltrates in both upper lobes. Pulmonary vascularity is  more prominent than on the prior study. No effusion.     IMPRESSION:  1. Marked improvement in the  consolidative infiltrate at the right  lung base.  2. New hazy infiltrates in both upper lobes.  3. Increased pulmonary vascular prominence.      Electronically Signed    By: Rozetta Nunnery M.D.    On: 02/19/2014 08:18     Verified By: Larey Seat, M.D.,  [place]:    09-Jul-15 19:55, [place] Color Flow Doppler Low Extrem Bilat (Legs)  [place] Color Flow Doppler Low Extrem Bilat (Legs)   REASON FOR EXAM:    Edema  COMMENTS:       PROCEDURE: [place]  - US DOPPLER LOW EXTR BILATERAL  - Feb 17 2014  7:55PM     CLINICAL DATA:  Right leg pain and swelling.  Shortness of breath.    EXAM:  BILATERAL LOWER EXTREMITY VENOUS DOPPLER ULTRASOUND    TECHNIQUE:  Gray-scale sonography with graded compression, as well as color  Doppler and duplex ultrasound were performed to evaluate the lower  extremity deep venous systems from the level of the common femoral  vein and including the common femoral, femoral, profunda femoral,  popliteal and calf veins including the posterior tibial, peroneal  and gastrocnemius veins when visible. The superficial great  saphenous vein was also interrogated. Spectral Doppler was utilized  to evaluate flow at rest and with distal augmentation maneuvers in  the common femoral, femoral and popliteal veins.    COMPARISON:  None.    FINDINGS:  RIGHT LOWER EXTREMITY    Common Femoral Vein: No evidence of thrombus. Normal  compressibility, respiratory phasicity and response to augmentation.    Saphenofemoral Junction: No evidence of thrombus. Normal  compressibility and flow on color Doppler imaging.    Profunda Femoral Vein: No evidence of thrombus. Normal  compressibility and flow on color Doppler imaging.    Femoral Vein: No evidence of thrombus. Normal compressibility,  respiratory phasicity and response to augmentation.    Popliteal Vein: No evidence of thrombus. Normal compressibility,  respiratory phasicity and response to augmentation.    Calf Veins: No evidence  of thrombus. Normal compressibility and flow  on color Doppler imaging.    Superficial Great Saphenous Vein: No evidence of thrombus. Normal  compressibility and flow on color Doppler imaging.    Venous Reflux:  None.    Other Findings:  None.    LEFT LOWER EXTREMITY    Common Femoral Vein: No evidence of thrombus. Normal  compressibility, respiratory phasicity and response to augmentation.    Saphenofemoral Junction: No evidence of thrombus. Normal  compressibility and flow on color Doppler imaging.    Profunda Femoral Vein: No evidence of thrombus. Normal  compressibility and flow on color Doppler imaging.    Femoral Vein: No evidence of thrombus. Normal compressibility,  respiratory phasicity and response to augmentation.    Popliteal Vein: No evidence of thrombus. Normal compressibility,  respiratory phasicity and response to augmentation.    Calf Veins: No evidence of thrombus. Normal compressibility and flow  on color Doppler imaging.    Superficial Great Saphenous Vein: No evidence of thrombus.  Normal  compressibility and flow on color Doppler imaging.    Venous Reflux:  None.  Other Findings:  None.     IMPRESSION:  No evidence of deep venous thrombosis in either lower extremity.      Electronically Signed    By: Earle Gell M.D.    On: 02/17/2014 20:32         Verified By: Marlaine Hind, M.D.,  Cardiology:    09-Jul-15 15:21, ECG  Ventricular Rate 101  Atrial Rate 101  P-R Interval 130  QRS Duration 108  QT 322  QTc 417  P Axis 50  R Axis 96  T Axis 44  ECG interpretation   Sinus tachycardia  Rightward axis  Borderline ECG  When compared with ECG of 03-May-2011 19:19,  T wave inversion no longer evident in Anterior leads  ----------unconfirmed----------  Confirmed by OVERREAD, NOT ([id number]), editor PEARSON, BARBARA ([id number]) on 02/18/2014 12:58:48 PM  ECG     09-Jul-15 18:41, Echo Doppler  Echo Doppler   REASON FOR EXAM:      COMMENTS:        PROCEDURE: [facility] - ECHO DOPPLER COMPLETE(TRANSTHOR)  - Feb 17 2014  6:41PM     RESULT: Echocardiogram Report    Patient Name:   Rebecca Holland [facility] Date of Exam: 02/17/2014  Medical Rec #:  [id number]               Custom1:  Date of Birth:  [birth year]             Height:       69.0 in  Patient Age:    34 years             Weight:       170.0 lb  Patient Gender: F                    BSA:          1.93 m??    Indications: CHF  Sonographer:    Arville Go RDCS  Referring Phys: Francene Castle, W    Sonographer Comments: Technically difficult study due to poor echo   windows.    Summary:   1. Left ventricular ejection fraction, by visual estimation, is 55 to   60%.   2. Normal global left ventricular systolic function.   3. Mildly increased left ventricular posterior wall thickness.  2D AND M-MODE MEASUREMENTS (normal ranges within parentheses):  Left Ventricle:          Normal  IVSd (2D):      1.20 cm (0.7-1.1)  LVPWd (2D):     1.20 cm (0.7-1.1) Aorta/LA:            Normal  LVIDd (2D):     4.20 cm (3.4-5.7) Aortic Root (2D): 3.10 cm (2.4-3.7)  LVIDs (2D):     3.00 cm           Left Atrium (2D): 3.10 cm (1.9-4.0)  LV FS (2D):     28.6 %   (>25%)  LV EF (2D):     55.5 %   (>50%)             Right Ventricle:                                    RVd (2D):  LV DIASTOLIC FUNCTION:  MV Peak E: 0.81 m/s Decel Time: 195 msec  MV Peak A: 1.04 m/s  E/A Ratio: 0.78  SPECTRAL DOPPLER ANALYSIS (where applicable):  Mitral Valve:  MV P1/2 Time: 56.55 msec  MV Area, PHT: 3.89 cm??  Aortic Valve: AoV Max Vel: 1.93 m/s AoV Peak PG: 14.9 mmHg AoV Mean PG:  LVOT Vmax: 1.35 m/s LVOT VTI:  LVOT Diameter:  Tricuspid Valve and PA/RV Systolic Pressure: TR Max Velocity: 2.67 m/s RA   Pressure: 5 mmHg RVSP/PASP: 33.5 mmHg  Pulmonic Valve:  PV Max Velocity: 1.13 m/s PV Max PG: 5.1 mmHg PV Mean PG:    PHYSICIAN INTERPRETATION:  Left Ventricle: The left ventricular internal cavity size was normal. LV    septal wall thickness was normal. LV posterior wall thickness was mildly   increased. Global LV systolic function was normal. Left ventricular   ejection fraction, by visual estimation, is 55 to 60%.  Right Ventricle: The right ventricular size is normal. Global RV systolic   function is normal.  Left Atrium: The left atrium is normal in size.  Right Atrium: The right atrium is normal in size.  Pericardium: There is no evidence of pericardial effusion.  Mitral Valve: The mitral valve is normal in structure. Trace mitral valve   regurgitation is seen.  Tricuspid Valve: The tricuspid valve is normal. Trivial tricuspid   regurgitation is visualized. The tricuspid regurgitant velocity is 2.67   m/s, and with an assumed right atrial pressure of 5 mmHg, the estimated   right ventricular systolicpressure is normal at 33.5 mmHg.  Aortic Valve: The aortic valve is normal. No evidence of aortic valve   regurgitation is seen.  Pulmonic Valve: The pulmonic valve is normal.    [place] MD  Electronically signed by [place] MD  Signature Date/Time: 02/18/2014/1:46:14 PM    *** Final ***  IMPRESSION: .        Verified By: Yolonda Kida, M.D., MD  CT:    09-Jul-15 20:07, CT Chest Without Contrast  CT Chest Without Contrast   REASON FOR EXAM:    hypoxia, smoker. no contrast as has severe dye allergy  COMMENTS:       PROCEDURE: CT  - CT CHEST WITHOUT CONTRAST  - Feb 17 2014  8:07PM     CLINICAL DATA:  Worsening shortness of breath. Productive cough.  Hypoxia. Smoker.    EXAM:  CT CHEST WITHOUT CONTRAST    TECHNIQUE:  Multidetector CT imaging of the chest was performed following the  standard protocol without IV contrast..  COMPARISON:  None.    FINDINGS:  Diffuse airspace disease with air bronchograms is seen throughout  the right lower lobe. There is also patchy airspace disease seen  involving the right middle and left lower lobes. There is  no  evidence of central endobronchial obstruction. Pulmonary  hyperinflation is consistent with COPD.    No evidence of pleural or pericardial effusion. Mild mediastinal  lymphadenopathy is seen, with largest lymph node in the subcarinal  region measuring 1.9 cm. Probable mild bilateral hilar  lymphadenopathy is also suggested, although evaluation is limited by  lack of intravenous contrast. This may be reactive in etiology.  A left adrenal mass is seen measuring 1.9 x 2.5 cm. This has  attenuation value of -9 Hounsfield units, consistent with a benign  adrenal adenoma.     IMPRESSION:  Diffuse right lower lobe airspace disease, with patchy airspace  disease in the right middle and lower lobes. This is likely due to  multilobar pneumonia.  Mild mediastinal and hilar lymphadenopathy, without evidence of  central endobronchial obstruction. This may be reactive in etiology.  Followup by chest CT with contrast recommended in 2-3 months.    Benign left adrenal adenoma incidentally noted.  Electronically Signed    By: Earle Gell M.D.    On: 02/17/2014 20:25         Verified By: Marlaine Hind, M.D.,  Nuclear Med:    10-Jul-15 10:33, Lung VQ Scan - Nuc Med  Lung VQ Scan - Nuc Med   REASON FOR EXAM:    resp. failure  COMMENTS:       PROCEDURE: NM  - NM VQ LUNG SCAN  - Feb 18 2014 10:33AM     CLINICAL DATA:  Respiratory failure    EXAM:  NUCLEAR MEDICINE VENTILATION - PERFUSION LUNG SCAN    Views: Anterior, posterior, left lateral, right lateral, RPO, LPO,  RAO, LAO -ventilation and perfusion    Radionuclide: Technetium 64mDTPA -ventilation; Technetium 932mmacroaggregated albumin-perfusion  Dose:  37.135 mCi-ventilation; 4.265 mCi-perfusion    Route of administration: Inhalation-ventilation; intravenous-  perfusion    COMPARISON:  Chest CT February 17, 2014    FINDINGS:  On the ventilation study, there is decreased radiotracer uptake in  right base in the area of  consolidation. Elsewhere, radiotracer  uptake on the ventilation study is within normal limits.    On the perfusion study, radiotracer uptake is homogeneous and  symmetric bilaterally. No perfusion defects are demonstrable.   IMPRESSION:  No appreciable perfusion defects. On the ventilation study, there is  somedecreased ventilation an area of known right base  consolidation. These findings constitute a very low probability of  pulmonary embolus given absence of perfusion defect.      Electronically Signed    By: WiLowella Grip.D.    On: 02/18/2014 10:45         Verified By: WILeafy KindleWOODRUFF, M.D.,   Assessment/Plan:  Assessment/Plan:  Assessment IMP SOB COPS Chronic pain Possible DVT Acute resp Failure .   Plan PLAN 02 Inhalers Steroids Lasix prn DVT prophylaxis IV antibx Agree with ECHO Continue supportive care I do not rec cardiac cath   Electronic Signatures: CaYolonda KidaMD)  (Signed 12-Jul-15 08:02)  Authored: Chief Complaint, VITAL SIGNS/ANCILLARY NOTES, Brief Assessment, Lab Results, Radiology Results, Assessment/Plan   Last Updated: 12-Jul-15 08:02 by CaLujean Amel (MD)

## 2014-12-03 NOTE — Discharge Summary (Signed)
PATIENT NAME:  Rebecca Holland, Rebecca Holland MR#:  [id number] DATE OF BIRTH:  Feb 19, 1951  DATE OF ADMISSION:  02/17/2014. DATE OF DISCHARGE:  02/22/2014.  DISCHARGE DIAGNOSES:  1. Acute on chronic respiratory failure.  2. Chronic obstructive pulmonary disease flare.  3. Pneumonia.  4. Depression and with history of mania on steroids.  5. Hyponatremia, syndrome of inappropriate antidiuretic hormone versus polydipsia, concern for hemochromatosis.  6. Hemachromatosis.   DISCHARGE MEDICATIONS: Per [facility] med reconciliation system. Briefly, she will be on a prednisone taper and Levaquin and she will have the addition of Symbicort at home as well as home oxygen.   HISTORY AND PHYSICAL: Please see detailed history and physical done on admission.   HOSPITAL COURSE: The patient was admitted to the service of Dr. Doy Hutching, her primary care doctor. He was gone for the past few days of her hospitalization as he was on vacation. She has a history of COPD and she had a documented pneumonia, as well. She improved on steroids, antibiotics, and bronchodilators. She was still quite weak and we initially thought she was going to need to go to a skilled facility. However, she was walking in the hallways by the time of discharge. She needed a walker and she knows to use that at home, and she does have a walker at home.   Initially she was thought to have pneumonia on chest x-rays. Echocardiogram was done with normal LV function. Her hyponatremia improved during the hospitalization, it was up to 130 by today's date. She had a persistent leukocytosis with her steroids,17,000 now but it was stable in that area. Repeat chest x-ray on July 11 showed marked improvement in the consolidative infiltrate in the right lung base. Her husband has agreed to stay with her at home for at least 1 week, given her acute illness. She did have a V/Q scan on July 10 that showed no appreciable perfusion defects.   It took approximately 35 minutes to do  all discharge tasks today.   ____________________________ Ocie Cornfield. Ouida Sills, MD mwa:lt D: 02/22/2014 07:35:03 ET T: 02/22/2014 07:43:33 ET JOB#: [id number]  cc: Ocie Cornfield. Ouida Sills, MD, <Dictator> Kirk Ruths MD ELECTRONICALLY SIGNED 02/23/2014 8:17
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
## 2014-12-06 ENCOUNTER — Other Ambulatory Visit: Payer: Medicare Other

## 2014-12-06 ENCOUNTER — Ambulatory Visit
Admit: 2014-12-06 | Disposition: A | Discharge status: Home / Self Care | Payer: Self-pay | Attending: Urology | Admitting: Urology

## 2014-12-11 NOTE — Op Note (Signed)
PATIENT NAME:  Rebecca Holland, Rebecca Holland MR#:  [id number] DATE OF BIRTH:  [birth year]  DATE OF PROCEDURE:  12/06/2014  PREOPERATIVE DIAGNOSIS: Bladder cancer.   POSTOPERATIVE DIAGNOSIS: Bladder cancer.   PROCEDURES: 1. Transurethral resection of bladder tumors.  2. Instillation of mitomycin-C into the bladder.   SURGEON: Maryan Puls, MD  ANESTHETIST: Dennard Nip, MD   ANESTHESIA: General.   INDICATIONS: See the dictated history and physical. After informed consent, the patient requests the above procedure.   OPERATIVE SUMMARY: After adequate general anesthesia had been obtained, the patient was placed into dorsal lithotomy position and the perineum was prepped and draped in the usual fashion. The 27 French resectoscope sheath was then advanced into the bladder with the obturator in place. The resectoscope was then coupled to the camera and then advanced into the sheath. The bladder was thoroughly inspected. Both ureteral orifices were identified and had clear efflux. The patient had multiple papillary tumors along the left lateral bladder wall lateral to the left orifice. The tumors extended all the way up to the dome on the left side. The patient  also had several areas consistent with carcinoma in situ just medial to the papillary tumors. The larger papillary tumors were then resected with the loop and fragments submitted to pathology. The smaller papillary tumors and the areas consistent with carcinoma in situ were cauterized with the rollerball electrode. Bladder was then inspected and all remaining bleeders tended to. The scope was then removed. Ten mL of viscous Xylocaine was instilled within the urethra and the bladder. A 20 French silicone catheter was placed and irrigated until clear. Mitomycin-C 40 mg was then instilled within the bladder and the catheter was plugged. A B and O suppository was placed. The procedure was then terminated and the patient was transferred to the recovery room  in stable condition.    ____________________________ Otelia Limes. Yves Dill, MD mrw:tr D: 12/06/2014 15:00:12 ET T: 12/06/2014 17:30:24 ET JOB#: [id number]  cc: Otelia Limes. Yves Dill, MD, <Dictator> Royston Cowper MD ELECTRONICALLY SIGNED 12/08/2014 14:01
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
## 2014-12-23 ENCOUNTER — Inpatient Hospital Stay: Payer: Medicare Other | Attending: Internal Medicine | Admitting: Internal Medicine

## 2014-12-23 ENCOUNTER — Inpatient Hospital Stay: Payer: Medicare Other

## 2014-12-23 VITALS — BP 127/80 | HR 97 | Temp 97.9°F | Ht 67.0 in | Wt 158.3 lb

## 2014-12-23 DIAGNOSIS — R51 Headache: Secondary | ICD-10-CM | POA: Diagnosis not present

## 2014-12-23 DIAGNOSIS — D72829 Elevated white blood cell count, unspecified: Secondary | ICD-10-CM | POA: Insufficient documentation

## 2014-12-23 DIAGNOSIS — D72820 Lymphocytosis (symptomatic): Secondary | ICD-10-CM | POA: Diagnosis not present

## 2014-12-23 DIAGNOSIS — R319 Hematuria, unspecified: Secondary | ICD-10-CM | POA: Insufficient documentation

## 2014-12-23 DIAGNOSIS — J449 Chronic obstructive pulmonary disease, unspecified: Secondary | ICD-10-CM | POA: Insufficient documentation

## 2014-12-23 DIAGNOSIS — F17210 Nicotine dependence, cigarettes, uncomplicated: Secondary | ICD-10-CM | POA: Insufficient documentation

## 2014-12-23 DIAGNOSIS — Z79899 Other long term (current) drug therapy: Secondary | ICD-10-CM | POA: Diagnosis not present

## 2014-12-23 DIAGNOSIS — Z9981 Dependence on supplemental oxygen: Secondary | ICD-10-CM | POA: Insufficient documentation

## 2014-12-23 DIAGNOSIS — Z803 Family history of malignant neoplasm of breast: Secondary | ICD-10-CM | POA: Insufficient documentation

## 2014-12-23 DIAGNOSIS — D751 Secondary polycythemia: Secondary | ICD-10-CM | POA: Diagnosis not present

## 2014-12-23 DIAGNOSIS — R519 Headache, unspecified: Secondary | ICD-10-CM

## 2014-12-23 DIAGNOSIS — C679 Malignant neoplasm of bladder, unspecified: Secondary | ICD-10-CM | POA: Insufficient documentation

## 2014-12-23 LAB — CBC WITH DIFFERENTIAL/PLATELET
BASOS ABS: 0.1 10*3/uL (ref 0–0.1)
Basophils Relative: 1 %
EOS ABS: 0.4 10*3/uL (ref 0–0.7)
Eosinophils Relative: 4 %
HEMATOCRIT: 44.4 % (ref 35.0–47.0)
HEMOGLOBIN: 14.5 g/dL (ref 12.0–16.0)
LYMPHS ABS: 3.7 10*3/uL — AB (ref 1.0–3.6)
MCH: 28.1 pg (ref 26.0–34.0)
MCHC: 32.7 g/dL (ref 32.0–36.0)
MCV: 85.8 fL (ref 80.0–100.0)
Monocytes Absolute: 1.1 10*3/uL — ABNORMAL HIGH (ref 0.2–0.9)
Neutro Abs: 5.6 10*3/uL (ref 1.4–6.5)
Neutrophils Relative %: 51 %
Platelets: 393 10*3/uL (ref 150–440)
RBC: 5.18 MIL/uL (ref 3.80–5.20)
RDW: 15.3 % — ABNORMAL HIGH (ref 11.5–14.5)
WBC: 10.9 10*3/uL (ref 3.6–11.0)

## 2014-12-23 LAB — COMPREHENSIVE METABOLIC PANEL
ALK PHOS: 80 U/L (ref 38–126)
ALT: 10 U/L — ABNORMAL LOW (ref 14–54)
AST: 13 U/L — ABNORMAL LOW (ref 15–41)
Albumin: 3.2 g/dL — ABNORMAL LOW (ref 3.5–5.0)
Anion gap: 5 (ref 5–15)
BILIRUBIN TOTAL: 0.6 mg/dL (ref 0.3–1.2)
BUN: 14 mg/dL (ref 6–20)
CO2: 39 mmol/L — AB (ref 22–32)
CREATININE: 0.65 mg/dL (ref 0.44–1.00)
Calcium: 9.6 mg/dL (ref 8.9–10.3)
Chloride: 88 mmol/L — ABNORMAL LOW (ref 101–111)
GFR calc Af Amer: >60 mL/min (ref >60)
GFR calc non Af Amer: >60 mL/min (ref >60)
GLUCOSE: 79 mg/dL (ref 65–99)
POTASSIUM: 4 mmol/L (ref 3.5–5.1)
Sodium: 132 mmol/L — ABNORMAL LOW (ref 135–145)
TOTAL PROTEIN: 7 g/dL (ref 6.5–8.1)

## 2014-12-26 ENCOUNTER — Ambulatory Visit
Admission: RE | Admit: 2014-12-26 | Discharge: 2014-12-26 | Disposition: A | Discharge status: Home / Self Care | Payer: Medicare Other | Source: Ambulatory Visit | Attending: Internal Medicine | Admitting: Internal Medicine

## 2014-12-26 ENCOUNTER — Ambulatory Visit: Admission: RE | Admit: 2014-12-26 | Payer: Medicare Other | Source: Ambulatory Visit

## 2014-12-26 DIAGNOSIS — R519 Headache, unspecified: Secondary | ICD-10-CM

## 2014-12-26 DIAGNOSIS — C679 Malignant neoplasm of bladder, unspecified: Secondary | ICD-10-CM

## 2014-12-26 DIAGNOSIS — R51 Headache: Secondary | ICD-10-CM

## 2014-12-26 HISTORY — DX: Malignant (primary) neoplasm, unspecified: C80.1

## 2014-12-26 HISTORY — DX: Unspecified asthma, uncomplicated: J45.909

## 2014-12-26 MED ORDER — IOHEXOL 300 MG/ML  SOLN: 100.0000 mL | Freq: Once | INTRAMUSCULAR | Status: AC | PRN

## 2014-12-27 ENCOUNTER — Ambulatory Visit
Admission: RE | Admit: 2014-12-27 | Discharge: 2014-12-27 | Disposition: A | Discharge status: Home / Self Care | Payer: Medicare Other | Source: Ambulatory Visit | Attending: Internal Medicine | Admitting: Internal Medicine

## 2014-12-27 DIAGNOSIS — C679 Malignant neoplasm of bladder, unspecified: Secondary | ICD-10-CM | POA: Insufficient documentation

## 2014-12-27 MED ORDER — TECHNETIUM TC 99M MEDRONATE IV KIT
25.0000 | PACK | Freq: Once | INTRAVENOUS | Status: AC | PRN
  Administered 2014-12-27: 23.22 via INTRAVENOUS

## 2014-12-28 ENCOUNTER — Encounter: Payer: Self-pay | Admitting: *Deleted

## 2014-12-28 ENCOUNTER — Ambulatory Visit
Admission: RE | Admit: 2014-12-28 | Discharge: 2014-12-28 | Disposition: A | Discharge status: Home / Self Care | Payer: Medicare Other | Source: Ambulatory Visit | Attending: Radiation Oncology | Admitting: Radiation Oncology

## 2014-12-28 ENCOUNTER — Encounter: Payer: Self-pay | Admitting: Radiation Oncology

## 2014-12-28 ENCOUNTER — Inpatient Hospital Stay (HOSPITAL_BASED_OUTPATIENT_CLINIC_OR_DEPARTMENT_OTHER): Payer: Medicare Other | Admitting: Internal Medicine

## 2014-12-28 VITALS — BP 131/79 | HR 91 | Temp 96.9°F | Resp 20 | Wt 160.1 lb

## 2014-12-28 DIAGNOSIS — D751 Secondary polycythemia: Secondary | ICD-10-CM

## 2014-12-28 DIAGNOSIS — C679 Malignant neoplasm of bladder, unspecified: Secondary | ICD-10-CM | POA: Diagnosis not present

## 2014-12-28 DIAGNOSIS — D72829 Elevated white blood cell count, unspecified: Secondary | ICD-10-CM

## 2014-12-28 DIAGNOSIS — Z9981 Dependence on supplemental oxygen: Secondary | ICD-10-CM

## 2014-12-28 DIAGNOSIS — Z79899 Other long term (current) drug therapy: Secondary | ICD-10-CM

## 2014-12-28 DIAGNOSIS — R319 Hematuria, unspecified: Secondary | ICD-10-CM | POA: Diagnosis not present

## 2014-12-28 DIAGNOSIS — D72820 Lymphocytosis (symptomatic): Secondary | ICD-10-CM

## 2014-12-28 DIAGNOSIS — Z803 Family history of malignant neoplasm of breast: Secondary | ICD-10-CM

## 2014-12-28 DIAGNOSIS — F17210 Nicotine dependence, cigarettes, uncomplicated: Secondary | ICD-10-CM

## 2014-12-28 DIAGNOSIS — J449 Chronic obstructive pulmonary disease, unspecified: Secondary | ICD-10-CM

## 2014-12-28 HISTORY — DX: Malignant neoplasm of bladder, unspecified: C67.9

## 2014-12-28 NOTE — Consult Note (Signed)
Radiation Oncology NEW PATIENT EVALUATION  Name: Rebecca Holland  MRN: [id number]  Date:   12/28/2014     DOB: 1951/01/17   This 64 y.o. female patient presents to the clinic for initial evaluation of bladder cancer. At least stage II (T2 N0 M0)  REFERRING PHYSICIAN: Idelle Crouch, MD  CHIEF COMPLAINT:  Chief Complaint  Patient presents with  . Bladder Cancer    Pt is here for initial consultation of bladder cancer    DIAGNOSIS: The encounter diagnosis was Malignant neoplasm of urinary bladder, unspecified site.   PREVIOUS INVESTIGATIONS:  CT scans are reviewed Pathology report reviewed Clinical notes reviewed  HPI: Patient is a 64 year old female with a several year history of gross painless hematuria. She recently presented to urology with a three-week history of again gross painless hematuria. CT scan performed in April showed normal kidneys very poor visual visualization of the bladder. 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 Urine cytology was positive. 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 Patient is seen today for opinion by both medical oncology and radiation oncology. She is on nasal oxygen has polycythemia vera and multiple medical comorbidities. She is doing fairly well today she states her bleeding has stopped over the past 2 weeks. She does have some slight urinary incontinence and wears a depends undergarment. Patient recently had a bone scan and CT scan show no evidence to suggest metastatic disease.  PLANNED TREATMENT REGIMEN: Induction chemotherapy followed by combined modality treatment with chemotherapy and radiation with bladder preservation  PAST MEDICAL HISTORY:  has a past medical history of Asthma;  Cancer; and Bladder cancer.    PAST SURGICAL HISTORY: History reviewed. No pertinent past surgical history.  FAMILY HISTORY: family history includes Cancer in her daughter and mother.  SOCIAL HISTORY:  reports that she has been smoking Cigarettes.  She has a 49 pack-year smoking history. She has never used smokeless tobacco.  ALLERGIES: Iohexol; Penicillins; and Sulfa antibiotics  MEDICATIONS:  Current Outpatient Prescriptions  Medication Sig Dispense Refill  . albuterol (PROVENTIL HFA;VENTOLIN HFA) 108 (90 BASE) MCG/ACT inhaler Inhale 2 puffs into the lungs every 6 (six) hours as needed for wheezing or shortness of breath.    . Calcium Carbonate-Vit D-Min (CALCIUM 1200 PO) Take 1 tablet by mouth daily.    . cyclobenzaprine (FLEXERIL) 10 MG tablet Take 10 mg by mouth 3 (three) times daily as needed for muscle spasms.    . diazepam (VALIUM) 5 MG tablet Take 5 mg by mouth every 6 (six) hours as needed for anxiety.    . docusate sodium (COLACE) 50 MG capsule Take 50 mg by mouth 2 (two) times daily.    . DULoxetine (CYMBALTA) 30 MG capsule Take 30 mg by mouth daily.    . fexofenadine (ALLEGRA) 180 MG tablet Take 180 mg by mouth daily.    . Misc Natural Products (COLON CARE PO) Take 1 capsule by mouth daily.    Marland Kitchen oxycodone (ROXICODONE) 30 MG immediate release tablet Take 30 mg by mouth every 6 (six) hours as needed for pain.    . Polyethylene Glycol 3350 (MIRALAX PO) Take 1 packet by mouth 2 (two) times daily.    . ranitidine (ZANTAC) 150 MG capsule Take 150 mg by mouth 2 (two) times daily.    Marland Kitchen tiotropium (  SPIRIVA) 18 MCG inhalation capsule Place 18 mcg into inhaler and inhale daily.    . Vitamin D, Cholecalciferol, 400 UNITS CAPS Take 2 tablets by mouth daily.    . zoledronic acid (RECLAST) 5 MG/100ML SOLN injection Inject 5 mg into the vein once.    Marland Kitchen atorvastatin (LIPITOR) 20 MG tablet Take by mouth.    . tiotropium (SPIRIVA) 18 MCG inhalation capsule Place 18 mcg into inhaler and inhale  daily.     No current facility-administered medications for this encounter.    ECOG PERFORMANCE STATUS:  1 - Symptomatic but completely ambulatory  REVIEW OF SYSTEMS: Except for the hematuria difficulty ambulating oxygen dependent COPD  Patient denies any weight loss, fatigue, weakness, fever, chills or night sweats. Patient denies any loss of vision, blurred vision. Patient denies any ringing  of the ears or hearing loss. No irregular heartbeat. Patient denies heart murmur or history of fainting. Patient denies any chest pain or pain radiating to her upper extremities. Patient denies any shortness of breath, difficulty breathing at night, cough or hemoptysis. Patient denies any swelling in the lower legs. Patient denies any nausea vomiting, vomiting of blood, or coffee ground material in the vomitus. Patient denies any stomach pain. Patient states has had normal bowel movements no significant constipation or diarrhea. Patient denies any dysuria, hematuria or significant nocturia. Patient denies any problems walking, swelling in the joints or loss of balance. Patient denies any skin changes, loss of hair or loss of weight. Patient denies any excessive worrying or anxiety or significant depression. Patient denies any problems with insomnia. Patient denies excessive thirst, polyuria, polydipsia. Patient denies any swollen glands, patient denies easy bruising or easy bleeding. Patient denies any recent infections, allergies or URI. Patient "s visual fields have not changed significantly in recent time. PHYSICAL EXAM: BP 131/79 mmHg  Pulse 91  Temp(Src) 96.9 F (36.1 C)  Resp 20  Wt 160 lb 0.9 oz (72.6 kg) Well-developed female in NAD. She is on nasal oxygen. No cervical or supraclavicular adenopathy is appreciated lungs are clear to A&P cardiac examination shows regular rate and rhythm abdomen is benign no superpubic tenderness is noted no peripheral edema in her lower extremities is  noted. Well-developed well-nourished patient in NAD. HEENT reveals PERLA, EOMI, discs not visualized.  Oral cavity is clear. No oral mucosal lesions are identified. Neck is clear without evidence of cervical or supraclavicular adenopathy. Lungs are clear to A&P. Cardiac examination is essentially unremarkable with regular rate and rhythm without murmur rub or thrill. Abdomen is benign with no organomegaly or masses noted. Motor sensory and DTR levels are equal and symmetric in the upper and lower extremities. Cranial nerves II through XII are grossly intact. Proprioception is intact. No peripheral adenopathy or edema is identified. No motor or sensory levels are noted. Crude visual fields are within normal range.   LABORATORY DATA:  No results found for this or any previous visit (from the past 72 hour(s)).   RADIOLOGY RESULTS: Ct Abdomen Pelvis Wo Contrast  12/26/2014   CLINICAL DATA:  Malignant neoplasm of the urinary bladder. Followup.  EXAM: CT CHEST, ABDOMEN AND PELVIS WITHOUT CONTRAST  TECHNIQUE: Multidetector CT imaging of the chest, abdomen and pelvis was performed following the standard protocol without IV contrast.  COMPARISON:  CT abdomen and pelvis 11/14/2014.  CT chest 05/27/2014  FINDINGS: CT CHEST FINDINGS  Mediastinum: The heart size is normal. There is no pericardial effusion. Calcified atherosclerotic disease involves the thoracic aorta as well as the  LAD and RCA Coronary arteries. The trachea appears patent and is midline. Normal appearance of the esophagus. No mediastinal or hilar adenopathy identified.  Lungs/Pleura: Mild changes of centrilobular and paraseptal emphysema. Chronic scar in the posterior right lung base is again noted. There is no pleural effusion. There is no airspace consolidation or atelectasis noted. Scar noted within the posterior right lung base. No suspicious pulmonary nodule or mass identified.  Musculoskeletal: There are no aggressive lytic or sclerotic bone  lesions identified.  CT ABDOMEN AND PELVIS FINDINGS  Hepatobiliary: There is no suspicious liver abnormality. The common bile duct measures 1.3 cm. Status post cholecystectomy.  Pancreas: Negative  Spleen: Negative  Adrenals/Urinary Tract: Bilateral adrenal nodules are noted and appears similar to previous exam compatible with benign adenomas. There is no nephrolithiasis or hydronephrosis. Low-attenuation, exophytic structure arising from the posterior aspect of the mid right kidney measures 1.2 cm, image 61/series 2. The urinary bladder appears within normal limits.  Stomach/Bowel: The stomach is within normal limits. The small bowel loops have a normal course and caliber. No obstruction. Normal appearance of the colon.  Vascular/Lymphatic: Calcified atherosclerotic disease involves the abdominal aorta. The infrarenal abdominal aorta is is ectatic measuring 2.8 cm. There is no retroperitoneal adenopathy. No mesenteric adenopathy. No pelvic or inguinal adenopathy noted.  Reproductive: The uterus appears surgically absent. There is no adnexal mass identified.  Other: No free fluid or fluid collections identified within the abdomen or pelvis.  Musculoskeletal: There is a scoliosis deformity involving the lumbar spine which is convex towards the right. There are no aggressive lytic or sclerotic bone lesions identified.  IMPRESSION: 1. There are no specific findings identified to suggest metastatic disease within the chest abdomen or pelvis. 2. Diffuse bronchial wall thickening with emphysema, as above; imaging findings suggestive of underlying COPD. 3. Aortic atherosclerosis as well as 2 vessel coronary artery calcification and ectasia of the abdominal aorta. Ectatic abdominal aorta at risk for aneurysm development. Recommend followup by ultrasound in 5 years. This recommendation follows ACR consensus guidelines: White Paper of the ACR Incidental Findings Committee II on Vascular Findings. J Am Coll Radiol 2013;  10:789-794. 4. Bilateral adrenal adenomas. 5. Lumbar scoliosis.   Electronically Signed   By: Kerby Moors M.D.   On: 12/26/2014 11:34   Ct Head Wo Contrast  12/26/2014   CLINICAL DATA:  64 year old female with history of bladder cancer. Evaluate for possible intracranial metastatic disease. Initial encounter.  EXAM: CT HEAD WITHOUT CONTRAST  TECHNIQUE: Contiguous axial images were obtained from the base of the skull through the vertex without intravenous contrast.  COMPARISON:  02/17/2013.  FINDINGS: No osseous destructive lesion.  Unenhanced imaging without evidence of intracranial metastatic disease. Without contrast, evaluation for detection of subtle intracranial metastatic disease is limited.  Small vessel disease type changes without CT evidence of large acute infarct.  No intracranial hemorrhage.  No hydrocephalus.  Mastoid air cells, middle ear cavities and visualized paranasal sinuses are clear. Visualized orbital structures unremarkable.  IMPRESSION: Unenhanced head CT without evidence of intracranial metastatic disease as noted above.   Electronically Signed   By: Genia Del M.D.   On: 12/26/2014 12:11   Ct Chest Wo Contrast  12/26/2014   CLINICAL DATA:  Malignant neoplasm of the urinary bladder. Followup.  EXAM: CT CHEST, ABDOMEN AND PELVIS WITHOUT CONTRAST  TECHNIQUE: Multidetector CT imaging of the chest, abdomen and pelvis was performed following the standard protocol without IV contrast.  COMPARISON:  CT abdomen and pelvis 11/14/2014.  CT  chest 05/27/2014  FINDINGS: CT CHEST FINDINGS  Mediastinum: The heart size is normal. There is no pericardial effusion. Calcified atherosclerotic disease involves the thoracic aorta as well as the LAD and RCA Coronary arteries. The trachea appears patent and is midline. Normal appearance of the esophagus. No mediastinal or hilar adenopathy identified.  Lungs/Pleura: Mild changes of centrilobular and paraseptal emphysema. Chronic scar in the posterior  right lung base is again noted. There is no pleural effusion. There is no airspace consolidation or atelectasis noted. Scar noted within the posterior right lung base. No suspicious pulmonary nodule or mass identified.  Musculoskeletal: There are no aggressive lytic or sclerotic bone lesions identified.  CT ABDOMEN AND PELVIS FINDINGS  Hepatobiliary: There is no suspicious liver abnormality. The common bile duct measures 1.3 cm. Status post cholecystectomy.  Pancreas: Negative  Spleen: Negative  Adrenals/Urinary Tract: Bilateral adrenal nodules are noted and appears similar to previous exam compatible with benign adenomas. There is no nephrolithiasis or hydronephrosis. Low-attenuation, exophytic structure arising from the posterior aspect of the mid right kidney measures 1.2 cm, image 61/series 2. The urinary bladder appears within normal limits.  Stomach/Bowel: The stomach is within normal limits. The small bowel loops have a normal course and caliber. No obstruction. Normal appearance of the colon.  Vascular/Lymphatic: Calcified atherosclerotic disease involves the abdominal aorta. The infrarenal abdominal aorta is is ectatic measuring 2.8 cm. There is no retroperitoneal adenopathy. No mesenteric adenopathy. No pelvic or inguinal adenopathy noted.  Reproductive: The uterus appears surgically absent. There is no adnexal mass identified.  Other: No free fluid or fluid collections identified within the abdomen or pelvis.  Musculoskeletal: There is a scoliosis deformity involving the lumbar spine which is convex towards the right. There are no aggressive lytic or sclerotic bone lesions identified.  IMPRESSION: 1. There are no specific findings identified to suggest metastatic disease within the chest abdomen or pelvis. 2. Diffuse bronchial wall thickening with emphysema, as above; imaging findings suggestive of underlying COPD. 3. Aortic atherosclerosis as well as 2 vessel coronary artery calcification and ectasia of  the abdominal aorta. Ectatic abdominal aorta at risk for aneurysm development. Recommend followup by ultrasound in 5 years. This recommendation follows ACR consensus guidelines: White Paper of the ACR Incidental Findings Committee II on Vascular Findings. J Am Coll Radiol 2013; 10:789-794. 4. Bilateral adrenal adenomas. 5. Lumbar scoliosis.   Electronically Signed   By: Kerby Moors M.D.   On: 12/26/2014 11:34   Nm Bone Scan Whole Body  12/27/2014   CLINICAL DATA:  Bladder cancer.  Pain.  EXAM: NUCLEAR MEDICINE WHOLE BODY BONE SCAN  TECHNIQUE: Whole body anterior and posterior images were obtained approximately 3 hours after intravenous injection of radiopharmaceutical.  RADIOPHARMACEUTICALS:  23.2 mCi Technetium-48m MDP IV  COMPARISON:  CT 12/26/2014.  FINDINGS: Right kidney is enlarged. Possible right hydronephrosis present. Although no definite abnormality noted on CT of 12/26/2014, renal ultrasound suggested to further evaluate. No focal bony abnormality noted to suggest metastatic disease.  IMPRESSION: 1. Right renal enlargement with possible hydronephrosis. Although no definite bony abnormality noted on CT of 12/26/2014, renal ultrasound suggested to further evaluate. 2. No evidence of bony metastatic disease.   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  PLAN: I discussed the case personally with medical oncology. Based on her multiple medical comorbidities would aim for  bladder preservation treatment. She has a high-grade muscle invasion and his muscle invading transitional cell carcinoma that I would recommend induction chemotherapy followed by concurrent chemoradiation. Risks and benefits of radiation were reviewed with the patient and her family including increased urinary lower tract symptoms, diarrhea, fatigue,  alteration of blood counts, possible skin reaction, and fatigue. We'll reevaluate the patient after completion of induction chemotherapy.  I would like to take this opportunity for allowing me to participate in the care of your patient.Armstead Peaks., MD

## 2014-12-30 NOTE — Progress Notes (Signed)
[facility]  [phone] [phone]) [phone]     ID: Rebecca Holland OB: 09/15/1950  MR#: [id number]  ASN#:[id number]  Patient Care Team: Idelle Crouch, MD as PCP - General (Internal Medicine)  CHIEF COMPLAINT/DIAGNOSIS:  Newly diagnosed Muscle Invasive High-Grade Bladder Cancer (Diagnosed by cystoscopy/TURBT by urologist Dr. Yves Dill on 12/06/14 which showed invasive urothelial carcinoma high-grade with micropapillary solid and papillary patterns, extensive invasion into fragments of muscularis propria is noted). TxNxMx. Patient referred here for oncology evaluation and management.  HISTORY OF PRESENT ILLNESS: Rebecca Holland is a 64 year old female with past medical history as described below. Patient had been seen in the past for erythrocytosis and was on intermittent phlebotomy. She was last seen here in October 2014, since then has not returned for follow-up. More recently, states that she had recurrent hematuria for over a year and recently underwent cystoscopy/TURBT by urologist Dr. Yves Dill on 12/06/2014 which showed invasive urothelial carcinoma high-grade with micropapillary solid and papillary patterns, extensive invasion into fragments of muscularis propria is noted. Given muscle invasive bladder cancer, patient has been referred here for oncology evaluation and management. She is on chronic oxygen for COPD, fairly physically active, has dyspnea on exertion. Denies any new chest pain or hemoptysis. She has significant low back pain which she feels has worsened lately. She does have intermittent and bothersome headache, otherwise denies seizures, falls or loss of consciousness. Appetite and weight is steady.  REVIEW OF SYSTEMS:   ROS CONSTITUTIONAL: As in HPI above. No chills, fever or sweats.    ENT:  No headache, dizziness or epistaxis. No ear or jaw pain. No sinus symptoms. RESPIRATORY: Chronic cough, dyspnea on exertion, on nasal cannula oxygen.  Currently denies wheezing. No hemoptysis. CARDIAC:  No palpitations.  No retrosternal chest pain. No orthopnea, PND. GI:  No abdominal pain, nausea or vomiting. No diarrhea.   GU:  No dysuria. Recurrent hematuria.  SKIN: No rashes or pruritus. HEMATOLOGIC: denies other bleeding symptoms MUSCULOSKELETAL:  No new bone pains.  EXTREMITY:  No new swelling or pain.  NEURO:  No focal weakness. No numbness or tingling of extremities.  No seizures.   ENDOCRINE:  No polyuria or polydipsia.  PS ECOG 2.  PAST MEDICAL HISTORY: Past Medical History  Diagnosis Date  . Asthma   . Cancer     bladder cancer dx last week  . Bladder cancer   h/o Erythrocytosis, mild leukocytosis with lymphocytosis  -  secondary to smoking (COHb elevated, JAK2 mutation and serum EPO unremarkable) and COPD COPD.  Muscle Invasive Bladder Cancer diagnosed by cystoscopy/biopsy on 12/06/14  PAST SURGICAL HISTORY: No past surgical history on file.  FAMILY HISTORY Family History  Problem Relation Age of Onset  . Cancer Mother     breast cancer  . Cancer Daughter     breast     ADVANCED DIRECTIVES:   SOCIAL HISTORY: History  Substance Use Topics  . Smoking status: Current Every Day Smoker -- 1.00 packs/day for 49 years    Types: Cigarettes  . Smokeless tobacco: Never Used  . Alcohol Use: Not on file    Allergies  Allergen Reactions  . Iohexol Anaphylaxis    Documented in [place]   . Penicillins     Other reaction(s): Unknown  . Sulfa Antibiotics Rash    Current Outpatient Prescriptions  Medication Sig Dispense Refill  . albuterol (PROVENTIL HFA;VENTOLIN HFA) 108 (90 BASE) MCG/ACT inhaler Inhale 2 puffs into the lungs every 6 (six) hours as needed for wheezing  or shortness of breath.    . Calcium Carbonate-Vit D-Min (CALCIUM 1200 PO) Take 1 tablet by mouth daily.    . cyclobenzaprine (FLEXERIL) 10 MG tablet Take 10 mg by mouth 3 (three) times daily as needed for muscle spasms.    . diazepam (VALIUM) 5  MG tablet Take 5 mg by mouth every 6 (six) hours as needed for anxiety.    . docusate sodium (COLACE) 50 MG capsule Take 50 mg by mouth 2 (two) times daily.    . DULoxetine (CYMBALTA) 30 MG capsule Take 30 mg by mouth daily.    . fexofenadine (ALLEGRA) 180 MG tablet Take 180 mg by mouth daily.    Marland Kitchen oxycodone (ROXICODONE) 30 MG immediate release tablet Take 30 mg by mouth every 6 (six) hours as needed for pain.    . Polyethylene Glycol 3350 (MIRALAX PO) Take 1 packet by mouth 2 (two) times daily.    . ranitidine (ZANTAC) 150 MG capsule Take 150 mg by mouth 2 (two) times daily.    Marland Kitchen tiotropium (SPIRIVA) 18 MCG inhalation capsule Place 18 mcg into inhaler and inhale daily.    . Vitamin D, Cholecalciferol, 400 UNITS CAPS Take 2 tablets by mouth daily.    Marland Kitchen atorvastatin (LIPITOR) 20 MG tablet Take by mouth.    . Misc Natural Products (COLON CARE PO) Take 1 capsule by mouth daily.    Marland Kitchen tiotropium (SPIRIVA) 18 MCG inhalation capsule Place 18 mcg into inhaler and inhale daily.    . zoledronic acid (RECLAST) 5 MG/100ML SOLN injection Inject 5 mg into the vein once.     No current facility-administered medications for this visit.    OBJECTIVE: Filed Vitals:   12/23/14 1150  BP: 127/80  Pulse: 97  Temp: 97.9 F (36.6 C)     Body mass index is 24.79 kg/(m^2).    ECOG FS:2 - Symptomatic, <50% confined to bed  GENERAL: Patient is moderately built and nourished individual, on nasal cannula oxygen, alert and oriented and in no acute distress. There is no icterus. HEENT: EOMs intact. Oral exam negative for thrush or lesions. No cervical lymphadenopathy. CVS: S1S2, regular LUNGS: Bilaterally decreased breath sounds overall, no rhonchi. ABDOMEN: Soft, nontender. No hepatosplenomegaly clinically.  NEURO: grossly nonfocal, cranial nerves are intact. Gait unremarkable. EXTREMITIES: No pedal edema. LYMPHATICS: No palpable adenopathy in axillary or inguinal areas. MUSCULOSKELETAL: No obvious swelling or  redness in joints SKIN: No major bruising or rash    Lab Results  Component Value Date   WBC 10.9 12/23/2014   NEUTROABS 5.6 12/23/2014   HGB 14.5 12/23/2014   HCT 44.4 12/23/2014   MCV 85.8 12/23/2014   PLT 393 12/23/2014      Chemistry      Component Value Date/Time   NA 132* 12/23/2014 1326   NA 130* 02/22/2014 0359   K 4.0 12/23/2014 1326   K 4.1 02/22/2014 0359   CL 88* 12/23/2014 1326   CL 89* 02/22/2014 0359   CO2 39* 12/23/2014 1326   CO2 34* 02/22/2014 0359   BUN 14 12/23/2014 1326   BUN 14 02/22/2014 0359   CREATININE 0.65 12/23/2014 1326   CREATININE 0.75 02/22/2014 0359      Component Value Date/Time   CALCIUM 9.6 12/23/2014 1326   CALCIUM 9.1 02/22/2014 0359   ALKPHOS 80 12/23/2014 1326   ALKPHOS 94 02/18/2014 0038   AST 13* 12/23/2014 1326   AST 14* 02/18/2014 0038   ALT 10* 12/23/2014 1326   ALT 10* 02/18/2014  0038   BILITOT 0.6 12/23/2014 1326      STUDIES:    ASSESSMENT / PLAN:   1. Newly diagnosed Muscle Invasive High-Grade Bladder Cancer (Diagnosed by cystoscopy/TURBT by urologist Dr. Yves Dill on 12/06/14 which showed invasive urothelial carcinoma high-grade with micropapillary solid and papillary patterns, extensive invasion into fragments of muscularis propria is noted). TxNxMx.  -  patient referred here for oncology evaluation and management. 6. Have reviewed records sent by Dr. Yves Dill and discussed with patient/family present (husband and children) in detail, have explained that given high-grade bladder cancer which is muscle invasive disease that is high-risk off systemic metastasis. The patient states that she already has discussed surgical options with Dr. Yves Dill and does not want to consider cystectomy and wants to consider other treatment options which would give her a chance to save the urinary bladder. Plan is to get staging workup including CT scan of the chest/abdomen/pelvis (without IV contrast since she reportedly had anaphylactic reaction  to contrast dye in the past and wants to [place] date) along with CT scan of the head given recurrent headaches. No significant bone scan to evaluate for bone metastasis. We will see her back after above workup is done and make further plan of management, have discussed general treatment approach with chemotherapy and radiation therapy for bladder sparing, and explained that specific treatment plan will be made once staging workup is completed. Patient states that hematuria is better at this time. She denies any major issues at this time. 2. H/o Erythrocytosis, mild leukocytosis with lymphocytosis  -  secondary to smoking (COHb elevated, JAK2 mutation and serum EPO unremarkable) and COPD. Gets phlebotomy if hematocrit rises above normal range. Will check CBC today.     In between visits, the patient has been advised to call or come to the ER in case of fevers, chills, bleeding, acute sickness, or new symptoms. They are agreeable to this plan.   Leia Alf, MD   12/30/2014 8:55 AM
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
## 2015-01-02 ENCOUNTER — Other Ambulatory Visit: Payer: Self-pay | Admitting: General Surgery

## 2015-01-02 ENCOUNTER — Encounter: Payer: Self-pay | Admitting: General Surgery

## 2015-01-02 ENCOUNTER — Ambulatory Visit (INDEPENDENT_AMBULATORY_CARE_PROVIDER_SITE_OTHER): Payer: Medicare Other | Admitting: General Surgery

## 2015-01-02 VITALS — BP 122/78 | HR 89 | Resp 15 | Ht 67.0 in | Wt 157.0 lb

## 2015-01-02 DIAGNOSIS — C679 Malignant neoplasm of bladder, unspecified: Secondary | ICD-10-CM
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
## 2015-01-03 LAB — SURGICAL PATHOLOGY

## 2015-01-04 ENCOUNTER — Encounter: Payer: Self-pay | Admitting: *Deleted

## 2015-01-04 ENCOUNTER — Other Ambulatory Visit: Payer: Medicare Other

## 2015-01-04 DIAGNOSIS — G709 Myoneural disorder, unspecified: Secondary | ICD-10-CM | POA: Diagnosis not present

## 2015-01-04 DIAGNOSIS — N289 Disorder of kidney and ureter, unspecified: Secondary | ICD-10-CM | POA: Diagnosis not present

## 2015-01-04 DIAGNOSIS — M1990 Unspecified osteoarthritis, unspecified site: Secondary | ICD-10-CM | POA: Diagnosis not present

## 2015-01-04 DIAGNOSIS — M797 Fibromyalgia: Secondary | ICD-10-CM | POA: Diagnosis not present

## 2015-01-04 DIAGNOSIS — I10 Essential (primary) hypertension: Secondary | ICD-10-CM | POA: Diagnosis not present

## 2015-01-04 DIAGNOSIS — F17200 Nicotine dependence, unspecified, uncomplicated: Secondary | ICD-10-CM | POA: Diagnosis not present

## 2015-01-04 DIAGNOSIS — Z452 Encounter for adjustment and management of vascular access device: Secondary | ICD-10-CM | POA: Diagnosis not present

## 2015-01-04 DIAGNOSIS — J449 Chronic obstructive pulmonary disease, unspecified: Secondary | ICD-10-CM | POA: Diagnosis not present

## 2015-01-04 DIAGNOSIS — C679 Malignant neoplasm of bladder, unspecified: Secondary | ICD-10-CM | POA: Diagnosis not present

## 2015-01-04 DIAGNOSIS — K219 Gastro-esophageal reflux disease without esophagitis: Secondary | ICD-10-CM | POA: Diagnosis not present

## 2015-01-04 DIAGNOSIS — R51 Headache: Secondary | ICD-10-CM | POA: Diagnosis not present

## 2015-01-04 DIAGNOSIS — F419 Anxiety disorder, unspecified: Secondary | ICD-10-CM | POA: Diagnosis not present

## 2015-01-04 DIAGNOSIS — J45909 Unspecified asthma, uncomplicated: Secondary | ICD-10-CM | POA: Diagnosis not present

## 2015-01-04 DIAGNOSIS — D649 Anemia, unspecified: Secondary | ICD-10-CM | POA: Diagnosis not present

## 2015-01-04 NOTE — Patient Instructions (Signed)
  Your procedure is scheduled on: 01-05-15 Report to [facility] To find out your arrival time please call [phone] between 1PM - 3PM on 01-04-15  Remember: Instructions that are not followed completely may result in serious medical risk, up to and including death, or upon the discretion of your surgeon and anesthesiologist your surgery may need to be rescheduled.    _X___ 1. Do not eat food or drink liquids after midnight. No gum chewing or hard candies.     _X___ 2. No Alcohol for 24 hours before or after surgery.   ____ 3. Bring all medications with you on the day of surgery if instructed.    ____ 4. Notify your doctor if there is any change in your medical condition     (cold, fever, infections).     Do not wear jewelry, make-up, hairpins, clips or nail polish.  Do not wear lotions, powders, or perfumes. You may wear deodorant.  Do not shave 48 hours prior to surgery. Men may shave face and neck.  Do not bring valuables to the hospital.    [facility] is not responsible for any belongings or valuables.               Contacts, dentures or bridgework may not be worn into surgery.  Leave your suitcase in the car. After surgery it may be brought to your room.  For patients admitted to the hospital, discharge time is determined by your    treatment team.   Patients discharged the day of surgery will not be allowed to drive home.   Please read over the following fact sheets that you were given:     __X__ Take these medicines the morning of surgery with A SIP OF WATER:    1.ZANTAC  2.ROXICODONE  3.   4.  5.  6.  ____ Fleet Enema (as directed)   ____ Use CHG Soap as directed  _X___ Use inhalers on the day of surgery-USE SPIRIVA AND [place]  ____ Stop metformin 2 days prior to surgery    ____ Take 1/2 of usual insulin dose the night before surgery and none on the morning of surgery.   ____ Stop Coumadin/Plavix/aspirin N/A  ____ Stop  Anti-inflammatories    __X__ Stop supplements until after surgery.  NOW (PROBIOTIC)  ____ Bring C-Pap to the hospital.

## 2015-01-05 ENCOUNTER — Ambulatory Visit: Payer: Medicare Other

## 2015-01-05 ENCOUNTER — Encounter
Admission: RE | Disposition: A | Discharge status: Home / Self Care | Payer: Self-pay | Source: Ambulatory Visit | Attending: General Surgery

## 2015-01-05 ENCOUNTER — Ambulatory Visit: Payer: Medicare Other | Admitting: Anesthesiology

## 2015-01-05 ENCOUNTER — Ambulatory Visit
Admission: RE | Admit: 2015-01-05 | Discharge: 2015-01-05 | Disposition: A | Discharge status: Home / Self Care | Payer: Medicare Other | Source: Ambulatory Visit | Attending: General Surgery | Admitting: General Surgery

## 2015-01-05 DIAGNOSIS — R51 Headache: Secondary | ICD-10-CM | POA: Insufficient documentation

## 2015-01-05 DIAGNOSIS — G709 Myoneural disorder, unspecified: Secondary | ICD-10-CM | POA: Insufficient documentation

## 2015-01-05 DIAGNOSIS — C679 Malignant neoplasm of bladder, unspecified: Secondary | ICD-10-CM | POA: Insufficient documentation

## 2015-01-05 DIAGNOSIS — F419 Anxiety disorder, unspecified: Secondary | ICD-10-CM | POA: Insufficient documentation

## 2015-01-05 DIAGNOSIS — F17200 Nicotine dependence, unspecified, uncomplicated: Secondary | ICD-10-CM | POA: Insufficient documentation

## 2015-01-05 DIAGNOSIS — J449 Chronic obstructive pulmonary disease, unspecified: Secondary | ICD-10-CM | POA: Insufficient documentation

## 2015-01-05 DIAGNOSIS — J45909 Unspecified asthma, uncomplicated: Secondary | ICD-10-CM | POA: Insufficient documentation

## 2015-01-05 DIAGNOSIS — D649 Anemia, unspecified: Secondary | ICD-10-CM | POA: Insufficient documentation

## 2015-01-05 DIAGNOSIS — Z452 Encounter for adjustment and management of vascular access device: Secondary | ICD-10-CM | POA: Insufficient documentation

## 2015-01-05 DIAGNOSIS — M1990 Unspecified osteoarthritis, unspecified site: Secondary | ICD-10-CM | POA: Insufficient documentation

## 2015-01-05 DIAGNOSIS — N289 Disorder of kidney and ureter, unspecified: Secondary | ICD-10-CM | POA: Insufficient documentation

## 2015-01-05 DIAGNOSIS — M797 Fibromyalgia: Secondary | ICD-10-CM | POA: Insufficient documentation

## 2015-01-05 DIAGNOSIS — I10 Essential (primary) hypertension: Secondary | ICD-10-CM | POA: Insufficient documentation

## 2015-01-05 DIAGNOSIS — K219 Gastro-esophageal reflux disease without esophagitis: Secondary | ICD-10-CM | POA: Insufficient documentation

## 2015-01-05 HISTORY — DX: Unspecified osteoarthritis, unspecified site: M19.90

## 2015-01-05 HISTORY — DX: Disease of blood and blood-forming organs, unspecified: D75.9

## 2015-01-05 HISTORY — DX: Essential (primary) hypertension: I10

## 2015-01-05 HISTORY — DX: Secondary polycythemia: D75.1

## 2015-01-05 HISTORY — DX: Fibromyalgia: M79.7

## 2015-01-05 HISTORY — PX: PORTACATH PLACEMENT: SHX2246

## 2015-01-05 HISTORY — DX: Headache, unspecified: R51.9

## 2015-01-05 HISTORY — DX: Headache: R51

## 2015-01-05 HISTORY — DX: Chronic obstructive pulmonary disease, unspecified: J44.9

## 2015-01-05 HISTORY — DX: Chronic kidney disease, unspecified: N18.9

## 2015-01-05 HISTORY — DX: Reserved for inherently not codable concepts without codable children: IMO0001

## 2015-01-05 HISTORY — DX: Dependence on supplemental oxygen: Z99.81

## 2015-01-05 HISTORY — DX: Gastro-esophageal reflux disease without esophagitis: K21.9

## 2015-01-05 HISTORY — DX: Anxiety disorder, unspecified: F41.9

## 2015-01-05 HISTORY — DX: Anemia, unspecified: D64.9

## 2015-01-05 SURGERY — INSERTION, TUNNELED CENTRAL VENOUS DEVICE, WITH PORT
Anesthesia: Monitor Anesthesia Care | Laterality: Left

## 2015-01-05 MED ORDER — PROPOFOL INFUSION 10 MG/ML OPTIME
INTRAVENOUS | Status: DC | PRN
Start: 2015-01-05 — End: 2015-01-05
  Administered 2015-01-05: 50 ug/kg/min via INTRAVENOUS

## 2015-01-05 MED ORDER — ONDANSETRON HCL 4 MG/2ML IJ SOLN
INTRAMUSCULAR | Status: DC | PRN
  Administered 2015-01-05: 4 mg via INTRAVENOUS

## 2015-01-05 MED ORDER — CHLORHEXIDINE GLUCONATE 4 % EX LIQD: 1.0000 "application " | Freq: Once | CUTANEOUS | Status: DC

## 2015-01-05 MED ORDER — CEFAZOLIN SODIUM-DEXTROSE 2-3 GM-% IV SOLR
2.0000 g | INTRAVENOUS | Status: AC
  Administered 2015-01-05: 2 g via INTRAVENOUS

## 2015-01-05 MED ORDER — CEFAZOLIN SODIUM-DEXTROSE 2-3 GM-% IV SOLR
INTRAVENOUS | Status: AC
  Administered 2015-01-05: 2 g via INTRAVENOUS
  Filled 2015-01-05: qty 50

## 2015-01-05 MED ORDER — PROPOFOL 10 MG/ML IV BOLUS
INTRAVENOUS | Status: DC | PRN
  Administered 2015-01-05 (×2): 20 mg via INTRAVENOUS
  Administered 2015-01-05: 10 mg via INTRAVENOUS
  Administered 2015-01-05: 20 mg via INTRAVENOUS

## 2015-01-05 MED ORDER — ONDANSETRON HCL 4 MG/2ML IJ SOLN: 4.0000 mg | Freq: Once | INTRAMUSCULAR | Status: DC | PRN

## 2015-01-05 MED ORDER — CEFAZOLIN SODIUM-DEXTROSE 2-3 GM-% IV SOLR
INTRAVENOUS | Status: DC | PRN
  Administered 2015-01-05: 2 g via INTRAVENOUS

## 2015-01-05 MED ORDER — SODIUM CHLORIDE 0.9 % IJ SOLN
INTRAMUSCULAR | Status: AC
  Filled 2015-01-05: qty 50

## 2015-01-05 MED ORDER — FENTANYL CITRATE (PF) 100 MCG/2ML IJ SOLN
INTRAMUSCULAR | Status: DC | PRN
  Administered 2015-01-05 (×2): 50 ug via INTRAVENOUS

## 2015-01-05 MED ORDER — LACTATED RINGERS IV SOLN
INTRAVENOUS | Status: DC | PRN
  Administered 2015-01-05: 08:00:00 via INTRAVENOUS

## 2015-01-05 MED ORDER — LACTATED RINGERS IV SOLN
Freq: Once | INTRAVENOUS | Status: AC
  Administered 2015-01-05: 08:00:00 via INTRAVENOUS

## 2015-01-05 MED ORDER — FENTANYL CITRATE (PF) 100 MCG/2ML IJ SOLN: 25.0000 ug | INTRAMUSCULAR | Status: DC | PRN

## 2015-01-05 MED ORDER — LIDOCAINE HCL (PF) 1 % IJ SOLN
INTRAMUSCULAR | Status: DC | PRN
  Administered 2015-01-05: 10 mL

## 2015-01-05 MED ORDER — MIDAZOLAM HCL 2 MG/2ML IJ SOLN
INTRAMUSCULAR | Status: DC | PRN
  Administered 2015-01-05: 2 mg via INTRAVENOUS

## 2015-01-05 MED ORDER — LIDOCAINE HCL (PF) 1 % IJ SOLN
INTRAMUSCULAR | Status: AC
  Filled 2015-01-05: qty 30

## 2015-01-05 SURGICAL SUPPLY — 26 items
BLADE SURG 15 STRL SS SAFETY (BLADE) ×3 IMPLANT
CHLORAPREP W/TINT 26ML (MISCELLANEOUS) ×3 IMPLANT
CLOSURE WOUND 1/2 X4 (GAUZE/BANDAGES/DRESSINGS) ×1
COVER LIGHT HANDLE STERIS (MISCELLANEOUS) ×6 IMPLANT
DRAPE C-ARM XRAY 36X54 (DRAPES) ×3 IMPLANT
DRAPE LAPAROTOMY TRNSV 106X77 (MISCELLANEOUS) ×3 IMPLANT
DRESSING TELFA 4X3 1S ST N-ADH (GAUZE/BANDAGES/DRESSINGS) ×3 IMPLANT
DRSG TEGADERM 2-3/8X2-3/4 SM (GAUZE/BANDAGES/DRESSINGS) ×3 IMPLANT
DRSG TEGADERM 4X4.75 (GAUZE/BANDAGES/DRESSINGS) ×3 IMPLANT
GLOVE BIO SURGEON STRL SZ7.5 (GLOVE) ×3 IMPLANT
GLOVE INDICATOR 8.0 STRL GRN (GLOVE) ×3 IMPLANT
GOWN STRL REUS W/ TWL LRG LVL3 (GOWN DISPOSABLE) ×2 IMPLANT
GOWN STRL REUS W/TWL LRG LVL3 (GOWN DISPOSABLE) ×6
KIT RM TURNOVER STRD PROC AR (KITS) ×3 IMPLANT
LABEL OR SOLS (LABEL) ×3 IMPLANT
NS IRRIG 500ML POUR BTL (IV SOLUTION) ×3 IMPLANT
PACK PORT-A-CATH (MISCELLANEOUS) ×3 IMPLANT
PAD GROUND ADULT SPLIT (MISCELLANEOUS) ×3 IMPLANT
PORTACATH POWER 8F (Port) ×3 IMPLANT
STRIP CLOSURE SKIN 1/2X4 (GAUZE/BANDAGES/DRESSINGS) ×2 IMPLANT
SUT PROLENE 3 0 SH DA (SUTURE) ×3 IMPLANT
SUT VIC AB 3-0 SH 27 (SUTURE) ×3
SUT VIC AB 3-0 SH 27X BRD (SUTURE) ×1 IMPLANT
SUT VIC AB 4-0 FS2 27 (SUTURE) ×3 IMPLANT
SWABSTK COMLB BENZOIN TINCTURE (MISCELLANEOUS) ×3 IMPLANT
SYR CONTROL 10ML (SYRINGE) ×6 IMPLANT

## 2015-01-05 NOTE — Anesthesia Postprocedure Evaluation (Signed)
  Anesthesia Post-op Note  Patient: Rebecca Holland  Procedure(s) Performed: Procedure(s): INSERTION PORT-A-CATH (Left)  Anesthesia type:MAC  Patient location: PACU  Post pain: Pain level controlled  Post assessment: Post-op Vital signs reviewed, Patient's Cardiovascular Status Stable, Respiratory Function Stable, Patent Airway and No signs of Nausea or vomiting  Post vital signs: Reviewed and stable  Last Vitals:  Filed Vitals:   01/05/15 1000  BP: 117/83  Pulse: 83  Temp:   Resp: 18    Level of consciousness: awake, alert  and patient cooperative  Complications: No apparent anesthesia complications

## 2015-01-05 NOTE — Transfer of Care (Signed)
Immediate Anesthesia Transfer of Care Note  Patient: Rebecca Holland  Procedure(s) Performed: Procedure(s): INSERTION PORT-A-CATH (Left)  Patient Location: PACU  Anesthesia Type:MAC  Level of Consciousness: alert   Airway & Oxygen Therapy: Patient Spontanous Breathing and Patient connected to nasal cannula oxygen  Post-op Assessment: Report given to RN and Post -op Vital signs reviewed and stable  Post vital signs: Reviewed and stable  Last Vitals:  Filed Vitals:   01/05/15 0726  BP: 111/71  Pulse: 92  Temp: 37 C  Resp: 16    Complications: No apparent anesthesia complications

## 2015-01-05 NOTE — Discharge Instructions (Signed)
OK to shower.  May remove outer plastic dressings in three days if desired. Apply ice to area intermittently for comfort.

## 2015-01-05 NOTE — H&P (Signed)
No change in clinical exam.  For power port placement. Will assess access possibilities in the OR.  Likely left subclavian if patent, otherwise right IJ. (Patient reports previous neck IV access in the past).

## 2015-01-05 NOTE — Op Note (Signed)
Preoperative diagnosis: Bladder cancer, need for chemotherapy.  Postoperative diagnosis: Same.  Operative procedure: Left subclavian PowerPort placement.  Operating surgeon Ollen Bowl, M.D.  Anesthesia, tended local, 10 mL 1% plain Xylocaine.  This 72 woman has been followed the cath bladder cancer and is a candidate for chemoradiation. Central venous access was requested by the treating oncologist.  Operative note with the patient supine on the operating table the area was prepped with Clorpactin draped. Ultrasound was used to confirm patency of the left subclavian vein. The patient was placed in Trendelenburg position. Local anesthesia was infiltrated. The vein was cannulated under ultrasound guidance. The guidewire is advanced into the SVC followed by the dilator and catheter. Fluoroscopy confirmed positioning of the junction of the SVC and right atrium. It was tunneled to a pocket on the left anterior chest. The port was anchored to the tissue with interrupted 30 proline sutures 2. The wound was closed in layers with 30 Vicryls in the adipose tissue and a running 4-0 Vicryls suture for the skin. Benzoin Steri-Strips Telfa and Tegaderm dressings were applied.  The patient tolerated procedure well and was taken to recovery in stable condition.  Portable chest x-ray obtained in the recovery area showed no evidence of pneumothorax and the catheter tip as described above.

## 2015-01-05 NOTE — Patient Instructions (Signed)
Gemcitabine injection What is this medicine? GEMCITABINE (jem SIT a been) is a chemotherapy drug. This medicine is used to treat many types of cancer like breast cancer, lung cancer, pancreatic cancer, and ovarian cancer. This medicine may be used for other purposes; ask your health care provider or pharmacist if you have questions. COMMON BRAND NAME(S): Gemzar What should I tell my health care provider before I take this medicine? They need to know if you have any of these conditions: -blood disorders -infection -kidney disease -liver disease -recent or ongoing radiation therapy -an unusual or allergic reaction to gemcitabine, other chemotherapy, other medicines, foods, dyes, or preservatives -pregnant or trying to get pregnant -breast-feeding How should I use this medicine? This drug is given as an infusion into a vein. It is administered in a hospital or clinic by a specially trained health care professional. Talk to your pediatrician regarding the use of this medicine in children. Special care may be needed. Overdosage: If you think you have taken too much of this medicine contact a poison control center or emergency room at once. NOTE: This medicine is only for you. Do not share this medicine with others. What if I miss a dose? It is important not to miss your dose. Call your doctor or health care professional if you are unable to keep an appointment. What may interact with this medicine? -medicines to increase blood counts like filgrastim, pegfilgrastim, sargramostim -some other chemotherapy drugs like cisplatin -vaccines Talk to your doctor or health care professional before taking any of these medicines: -acetaminophen -aspirin -ibuprofen -ketoprofen -naproxen This list may not describe all possible interactions. Give your health care provider a list of all the medicines, herbs, non-prescription drugs, or dietary supplements you use. Also tell them if you smoke, drink alcohol,  or use illegal drugs. Some items may interact with your medicine. What should I watch for while using this medicine? Visit your doctor for checks on your progress. This drug may make you feel generally unwell. This is not uncommon, as chemotherapy can affect healthy cells as well as cancer cells. Report any side effects. Continue your course of treatment even though you feel ill unless your doctor tells you to stop. In some cases, you may be given additional medicines to help with side effects. Follow all directions for their use. Call your doctor or health care professional for advice if you get a fever, chills or sore throat, or other symptoms of a cold or flu. Do not treat yourself. This drug decreases your body's ability to fight infections. Try to avoid being around people who are sick. This medicine may increase your risk to bruise or bleed. Call your doctor or health care professional if you notice any unusual bleeding. Be careful brushing and flossing your teeth or using a toothpick because you may get an infection or bleed more easily. If you have any dental work done, tell your dentist you are receiving this medicine. Avoid taking products that contain aspirin, acetaminophen, ibuprofen, naproxen, or ketoprofen unless instructed by your doctor. These medicines may hide a fever. Women should inform their doctor if they wish to become pregnant or think they might be pregnant. There is a potential for serious side effects to an unborn child. Talk to your health care professional or pharmacist for more information. Do not breast-feed an infant while taking this medicine. What side effects may I notice from receiving this medicine? Side effects that you should report to your doctor or health care professional as   soon as possible: -allergic reactions like skin rash, itching or hives, swelling of the face, lips, or tongue -low blood counts - this medicine may decrease the number of white blood cells,  red blood cells and platelets. You may be at increased risk for infections and bleeding. -signs of infection - fever or chills, cough, sore throat, pain or difficulty passing urine -signs of decreased platelets or bleeding - bruising, pinpoint red spots on the skin, black, tarry stools, blood in the urine -signs of decreased red blood cells - unusually weak or tired, fainting spells, lightheadedness -breathing problems -chest pain -mouth sores -nausea and vomiting -pain, swelling, redness at site where injected -pain, tingling, numbness in the hands or feet -stomach pain -swelling of ankles, feet, hands -unusual bleeding Side effects that usually do not require medical attention (report to your doctor or health care professional if they continue or are bothersome): -constipation -diarrhea -hair loss -loss of appetite -stomach upset This list may not describe all possible side effects. Call your doctor for medical advice about side effects. You may report side effects to FDA at 1-800-FDA-1088. Where should I keep my medicine? This drug is given in a hospital or clinic and will not be stored at home. NOTE: This sheet is a summary. It may not cover all possible information. If you have questions about this medicine, talk to your doctor, pharmacist, or health care provider.  2015, Elsevier/Gold Standard. (2007-12-08 18:45:54) Carboplatin injection What is this medicine? CARBOPLATIN (KAR boe pla tin) is a chemotherapy drug. It targets fast dividing cells, like cancer cells, and causes these cells to die. This medicine is used to treat ovarian cancer and many other cancers. This medicine may be used for other purposes; ask your health care provider or pharmacist if you have questions. COMMON BRAND NAME(S): Paraplatin What should I tell my health care provider before I take this medicine? They need to know if you have any of these conditions: -blood disorders -hearing problems -kidney  disease -recent or ongoing radiation therapy -an unusual or allergic reaction to carboplatin, cisplatin, other chemotherapy, other medicines, foods, dyes, or preservatives -pregnant or trying to get pregnant -breast-feeding How should I use this medicine? This drug is usually given as an infusion into a vein. It is administered in a hospital or clinic by a specially trained health care professional. Talk to your pediatrician regarding the use of this medicine in children. Special care may be needed. Overdosage: If you think you have taken too much of this medicine contact a poison control center or emergency room at once. NOTE: This medicine is only for you. Do not share this medicine with others. What if I miss a dose? It is important not to miss a dose. Call your doctor or health care professional if you are unable to keep an appointment. What may interact with this medicine? -medicines for seizures -medicines to increase blood counts like filgrastim, pegfilgrastim, sargramostim -some antibiotics like amikacin, gentamicin, neomycin, streptomycin, tobramycin -vaccines Talk to your doctor or health care professional before taking any of these medicines: -acetaminophen -aspirin -ibuprofen -ketoprofen -naproxen This list may not describe all possible interactions. Give your health care provider a list of all the medicines, herbs, non-prescription drugs, or dietary supplements you use. Also tell them if you smoke, drink alcohol, or use illegal drugs. Some items may interact with your medicine. What should I watch for while using this medicine? Your condition will be monitored carefully while you are receiving this medicine. You will need   important blood work done while you are taking this medicine. This drug may make you feel generally unwell. This is not uncommon, as chemotherapy can affect healthy cells as well as cancer cells. Report any side effects. Continue your course of treatment even  though you feel ill unless your doctor tells you to stop. In some cases, you may be given additional medicines to help with side effects. Follow all directions for their use. Call your doctor or health care professional for advice if you get a fever, chills or sore throat, or other symptoms of a cold or flu. Do not treat yourself. This drug decreases your body's ability to fight infections. Try to avoid being around people who are sick. This medicine may increase your risk to bruise or bleed. Call your doctor or health care professional if you notice any unusual bleeding. Be careful brushing and flossing your teeth or using a toothpick because you may get an infection or bleed more easily. If you have any dental work done, tell your dentist you are receiving this medicine. Avoid taking products that contain aspirin, acetaminophen, ibuprofen, naproxen, or ketoprofen unless instructed by your doctor. These medicines may hide a fever. Do not become pregnant while taking this medicine. Women should inform their doctor if they wish to become pregnant or think they might be pregnant. There is a potential for serious side effects to an unborn child. Talk to your health care professional or pharmacist for more information. Do not breast-feed an infant while taking this medicine. What side effects may I notice from receiving this medicine? Side effects that you should report to your doctor or health care professional as soon as possible: -allergic reactions like skin rash, itching or hives, swelling of the face, lips, or tongue -signs of infection - fever or chills, cough, sore throat, pain or difficulty passing urine -signs of decreased platelets or bleeding - bruising, pinpoint red spots on the skin, black, tarry stools, nosebleeds -signs of decreased red blood cells - unusually weak or tired, fainting spells, lightheadedness -breathing problems -changes in hearing -changes in vision -chest pain -high  blood pressure -low blood counts - This drug may decrease the number of white blood cells, red blood cells and platelets. You may be at increased risk for infections and bleeding. -nausea and vomiting -pain, swelling, redness or irritation at the injection site -pain, tingling, numbness in the hands or feet -problems with balance, talking, walking -trouble passing urine or change in the amount of urine Side effects that usually do not require medical attention (report to your doctor or health care professional if they continue or are bothersome): -hair loss -loss of appetite -metallic taste in the mouth or changes in taste This list may not describe all possible side effects. Call your doctor for medical advice about side effects. You may report side effects to FDA at 1-800-FDA-1088. Where should I keep my medicine? This drug is given in a hospital or clinic and will not be stored at home. NOTE: This sheet is a summary. It may not cover all possible information. If you have questions about this medicine, talk to your doctor, pharmacist, or health care provider.  2015, Elsevier/Gold Standard. (2007-11-03 14:38:05)  
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
## 2015-01-10 ENCOUNTER — Other Ambulatory Visit: Payer: Self-pay | Admitting: *Deleted

## 2015-01-10 ENCOUNTER — Ambulatory Visit: Payer: Medicare Other

## 2015-01-11 ENCOUNTER — Inpatient Hospital Stay: Payer: Medicare Other | Attending: Internal Medicine

## 2015-01-11 ENCOUNTER — Inpatient Hospital Stay: Payer: Medicare Other

## 2015-01-11 ENCOUNTER — Inpatient Hospital Stay (HOSPITAL_BASED_OUTPATIENT_CLINIC_OR_DEPARTMENT_OTHER): Payer: Medicare Other | Admitting: Internal Medicine

## 2015-01-11 ENCOUNTER — Telehealth: Payer: Self-pay | Admitting: *Deleted

## 2015-01-11 VITALS — BP 126/77 | HR 91 | Temp 98.0°F | Resp 18 | Ht 67.0 in | Wt 156.3 lb

## 2015-01-11 DIAGNOSIS — K219 Gastro-esophageal reflux disease without esophagitis: Secondary | ICD-10-CM | POA: Diagnosis not present

## 2015-01-11 DIAGNOSIS — N189 Chronic kidney disease, unspecified: Secondary | ICD-10-CM | POA: Insufficient documentation

## 2015-01-11 DIAGNOSIS — Z8541 Personal history of malignant neoplasm of cervix uteri: Secondary | ICD-10-CM | POA: Diagnosis not present

## 2015-01-11 DIAGNOSIS — J449 Chronic obstructive pulmonary disease, unspecified: Secondary | ICD-10-CM | POA: Insufficient documentation

## 2015-01-11 DIAGNOSIS — D751 Secondary polycythemia: Secondary | ICD-10-CM | POA: Diagnosis not present

## 2015-01-11 DIAGNOSIS — F418 Other specified anxiety disorders: Secondary | ICD-10-CM | POA: Diagnosis not present

## 2015-01-11 DIAGNOSIS — Z79899 Other long term (current) drug therapy: Secondary | ICD-10-CM | POA: Diagnosis not present

## 2015-01-11 DIAGNOSIS — Z9981 Dependence on supplemental oxygen: Secondary | ICD-10-CM

## 2015-01-11 DIAGNOSIS — R531 Weakness: Secondary | ICD-10-CM | POA: Diagnosis not present

## 2015-01-11 DIAGNOSIS — F17210 Nicotine dependence, cigarettes, uncomplicated: Secondary | ICD-10-CM

## 2015-01-11 DIAGNOSIS — I129 Hypertensive chronic kidney disease with stage 1 through stage 4 chronic kidney disease, or unspecified chronic kidney disease: Secondary | ICD-10-CM | POA: Insufficient documentation

## 2015-01-11 DIAGNOSIS — D72820 Lymphocytosis (symptomatic): Secondary | ICD-10-CM

## 2015-01-11 DIAGNOSIS — D72829 Elevated white blood cell count, unspecified: Secondary | ICD-10-CM | POA: Diagnosis not present

## 2015-01-11 DIAGNOSIS — R102 Pelvic and perineal pain: Secondary | ICD-10-CM | POA: Diagnosis not present

## 2015-01-11 DIAGNOSIS — M069 Rheumatoid arthritis, unspecified: Secondary | ICD-10-CM | POA: Diagnosis not present

## 2015-01-11 DIAGNOSIS — M549 Dorsalgia, unspecified: Secondary | ICD-10-CM | POA: Insufficient documentation

## 2015-01-11 DIAGNOSIS — Z85828 Personal history of other malignant neoplasm of skin: Secondary | ICD-10-CM | POA: Diagnosis not present

## 2015-01-11 DIAGNOSIS — C679 Malignant neoplasm of bladder, unspecified: Secondary | ICD-10-CM | POA: Diagnosis not present

## 2015-01-11 DIAGNOSIS — M797 Fibromyalgia: Secondary | ICD-10-CM | POA: Diagnosis not present

## 2015-01-11 DIAGNOSIS — Z5111 Encounter for antineoplastic chemotherapy: Secondary | ICD-10-CM | POA: Diagnosis present

## 2015-01-11 LAB — BASIC METABOLIC PANEL
ANION GAP: 6 (ref 5–15)
BUN: 9 mg/dL (ref 6–20)
CALCIUM: 9 mg/dL (ref 8.9–10.3)
CHLORIDE: 91 mmol/L — AB (ref 101–111)
CO2: 35 mmol/L — AB (ref 22–32)
Creatinine, Ser: 0.78 mg/dL (ref 0.44–1.00)
GFR calc non Af Amer: >60 mL/min (ref >60)
GLUCOSE: 136 mg/dL — AB (ref 65–99)
POTASSIUM: 3.8 mmol/L (ref 3.5–5.1)
Sodium: 132 mmol/L — ABNORMAL LOW (ref 135–145)

## 2015-01-11 LAB — CBC WITH DIFFERENTIAL/PLATELET
BASOS ABS: 0 10*3/uL (ref 0–0.1)
Basophils Relative: 1 %
Eosinophils Absolute: 0.3 10*3/uL (ref 0–0.7)
Eosinophils Relative: 3 %
HEMATOCRIT: 42.9 % (ref 35.0–47.0)
Hemoglobin: 13.9 g/dL (ref 12.0–16.0)
LYMPHS ABS: 2.3 10*3/uL (ref 1.0–3.6)
Lymphocytes Relative: 25 %
MCH: 27.8 pg (ref 26.0–34.0)
MCHC: 32.4 g/dL (ref 32.0–36.0)
MCV: 85.9 fL (ref 80.0–100.0)
Monocytes Absolute: 0.8 10*3/uL (ref 0.2–0.9)
Monocytes Relative: 9 %
NEUTROS PCT: 62 %
Neutro Abs: 5.7 10*3/uL (ref 1.4–6.5)
Platelets: 280 10*3/uL (ref 150–440)
RBC: 5 MIL/uL (ref 3.80–5.20)
RDW: 15.7 % — ABNORMAL HIGH (ref 11.5–14.5)
WBC: 9 10*3/uL (ref 3.6–11.0)

## 2015-01-11 LAB — HEPATIC FUNCTION PANEL
ALT: 8 U/L — ABNORMAL LOW (ref 14–54)
AST: 18 U/L (ref 15–41)
Albumin: 3.3 g/dL — ABNORMAL LOW (ref 3.5–5.0)
Alkaline Phosphatase: 84 U/L (ref 38–126)
BILIRUBIN DIRECT: 0.1 mg/dL (ref 0.1–0.5)
Indirect Bilirubin: 0.4 mg/dL (ref 0.3–0.9)
TOTAL PROTEIN: 7 g/dL (ref 6.5–8.1)
Total Bilirubin: 0.5 mg/dL (ref 0.3–1.2)

## 2015-01-11 MED ORDER — PALONOSETRON HCL INJECTION 0.25 MG/5ML
0.2500 mg | Freq: Once | INTRAVENOUS | Status: AC
  Administered 2015-01-11: 0.25 mg via INTRAVENOUS
  Filled 2015-01-11: qty 5

## 2015-01-11 MED ORDER — PROMETHAZINE HCL 25 MG PO TABS: 25.0000 mg | ORAL_TABLET | Freq: Four times a day (QID) | ORAL | Status: DC | PRN

## 2015-01-11 MED ORDER — HEPARIN SOD (PORK) LOCK FLUSH 100 UNIT/ML IV SOLN
500.0000 [IU] | Freq: Once | INTRAVENOUS | Status: AC | PRN
  Administered 2015-01-11: 500 [IU]
  Filled 2015-01-11: qty 5

## 2015-01-11 MED ORDER — SODIUM CHLORIDE 0.9 % IV SOLN
Freq: Once | INTRAVENOUS | Status: AC
  Administered 2015-01-11: 12:00:00 via INTRAVENOUS
  Filled 2015-01-11: qty 1000

## 2015-01-11 MED ORDER — SODIUM CHLORIDE 0.9 % IV SOLN: Freq: Once | INTRAVENOUS | Status: DC

## 2015-01-11 MED ORDER — SODIUM CHLORIDE 0.9 % IV SOLN
209.0000 mg | Freq: Once | INTRAVENOUS | Status: AC
  Administered 2015-01-11: 210 mg via INTRAVENOUS
  Filled 2015-01-11: qty 21

## 2015-01-11 MED ORDER — SODIUM CHLORIDE 0.9 % IV SOLN
Freq: Once | INTRAVENOUS | Status: AC
  Administered 2015-01-11: 12:00:00 via INTRAVENOUS
  Filled 2015-01-11: qty 5

## 2015-01-11 MED ORDER — GEMCITABINE HCL CHEMO INJECTION 1 GM/26.3ML
1450.0000 mg | Freq: Once | INTRAVENOUS | Status: AC
  Administered 2015-01-11: 1450 mg via INTRAVENOUS
  Filled 2015-01-11: qty 27.61

## 2015-01-11 NOTE — Progress Notes (Signed)
Pt eating small items 6 times a day. Pain is bad right now in back, shoulder left, waist line and left chest.

## 2015-01-11 NOTE — Telephone Encounter (Signed)
Rx given when called because pt was at pharmacy waiting
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
## 2015-01-17 ENCOUNTER — Other Ambulatory Visit: Payer: Self-pay

## 2015-01-18 ENCOUNTER — Inpatient Hospital Stay: Payer: Medicare Other

## 2015-01-18 ENCOUNTER — Other Ambulatory Visit: Payer: Medicare Other

## 2015-01-18 VITALS — BP 119/79 | HR 81 | Temp 96.9°F | Resp 18

## 2015-01-18 DIAGNOSIS — C679 Malignant neoplasm of bladder, unspecified: Secondary | ICD-10-CM

## 2015-01-18 DIAGNOSIS — Z5111 Encounter for antineoplastic chemotherapy: Secondary | ICD-10-CM | POA: Diagnosis not present

## 2015-01-18 LAB — CBC WITH DIFFERENTIAL/PLATELET
BASOS ABS: 0 10*3/uL (ref 0–0.1)
BASOS PCT: 0 %
Eosinophils Absolute: 0.1 10*3/uL (ref 0–0.7)
Eosinophils Relative: 1 %
HCT: 41.1 % (ref 35.0–47.0)
Hemoglobin: 13.3 g/dL (ref 12.0–16.0)
Lymphocytes Relative: 37 %
Lymphs Abs: 2.4 10*3/uL (ref 1.0–3.6)
MCH: 27.8 pg (ref 26.0–34.0)
MCHC: 32.5 g/dL (ref 32.0–36.0)
MCV: 85.6 fL (ref 80.0–100.0)
MONO ABS: 0.3 10*3/uL (ref 0.2–0.9)
MONOS PCT: 4 %
NEUTROS ABS: 3.8 10*3/uL (ref 1.4–6.5)
Neutrophils Relative %: 58 %
Platelets: 227 10*3/uL (ref 150–440)
RBC: 4.81 MIL/uL (ref 3.80–5.20)
RDW: 15.3 % — AB (ref 11.5–14.5)
WBC: 6.5 10*3/uL (ref 3.6–11.0)

## 2015-01-18 LAB — CREATININE, SERUM
Creatinine, Ser: 0.72 mg/dL (ref 0.44–1.00)
GFR calc Af Amer: >60 mL/min (ref >60)
GFR calc non Af Amer: >60 mL/min (ref >60)

## 2015-01-18 MED ORDER — PALONOSETRON HCL INJECTION 0.25 MG/5ML
0.2500 mg | Freq: Once | INTRAVENOUS | Status: AC
Start: 2015-01-18 — End: 2015-01-18
  Administered 2015-01-18: 0.25 mg via INTRAVENOUS
  Filled 2015-01-18: qty 5

## 2015-01-18 MED ORDER — SODIUM CHLORIDE 0.9 % IJ SOLN
10.0000 mL | INTRAMUSCULAR | Status: DC | PRN
  Administered 2015-01-18: 10 mL
  Filled 2015-01-18: qty 10

## 2015-01-18 MED ORDER — HEPARIN SOD (PORK) LOCK FLUSH 100 UNIT/ML IV SOLN
500.0000 [IU] | Freq: Once | INTRAVENOUS | Status: AC | PRN
  Administered 2015-01-18: 500 [IU]
  Filled 2015-01-18: qty 5

## 2015-01-18 MED ORDER — SODIUM CHLORIDE 0.9 % IV SOLN
Freq: Once | INTRAVENOUS | Status: AC
  Administered 2015-01-18: 11:00:00 via INTRAVENOUS
  Filled 2015-01-18: qty 5

## 2015-01-18 MED ORDER — SODIUM CHLORIDE 0.9 % IV SOLN
Freq: Once | INTRAVENOUS | Status: AC
  Administered 2015-01-18: 11:00:00 via INTRAVENOUS
  Filled 2015-01-18: qty 1000

## 2015-01-18 MED ORDER — SODIUM CHLORIDE 0.9 % IV SOLN
1450.0000 mg | Freq: Once | INTRAVENOUS | Status: AC
  Administered 2015-01-18: 1450 mg via INTRAVENOUS
  Filled 2015-01-18: qty 32.88

## 2015-01-18 MED ORDER — PROCHLORPERAZINE MALEATE 10 MG PO TABS: 10.0000 mg | ORAL_TABLET | Freq: Once | ORAL | Status: DC

## 2015-01-18 MED ORDER — SODIUM CHLORIDE 0.9 % IV SOLN
209.0000 mg | Freq: Once | INTRAVENOUS | Status: AC
  Administered 2015-01-18: 210 mg via INTRAVENOUS
  Filled 2015-01-18: qty 21

## 2015-01-24 ENCOUNTER — Telehealth: Payer: Self-pay | Admitting: *Deleted

## 2015-01-24 MED ORDER — ONDANSETRON HCL 8 MG PO TABS: 8.0000 mg | ORAL_TABLET | Freq: Three times a day (TID) | ORAL | Status: DC | PRN

## 2015-01-24 NOTE — Telephone Encounter (Signed)
Advised that rx sent to pharmacy

## 2015-01-31 ENCOUNTER — Other Ambulatory Visit: Payer: Self-pay

## 2015-02-01 ENCOUNTER — Inpatient Hospital Stay: Payer: Medicare Other

## 2015-02-01 ENCOUNTER — Encounter: Payer: Self-pay | Admitting: Internal Medicine

## 2015-02-01 ENCOUNTER — Inpatient Hospital Stay (HOSPITAL_BASED_OUTPATIENT_CLINIC_OR_DEPARTMENT_OTHER): Payer: Medicare Other | Admitting: Internal Medicine

## 2015-02-01 VITALS — BP 143/85 | HR 93 | Temp 97.2°F | Resp 20 | Ht 67.0 in | Wt 157.7 lb

## 2015-02-01 DIAGNOSIS — M797 Fibromyalgia: Secondary | ICD-10-CM

## 2015-02-01 DIAGNOSIS — D72829 Elevated white blood cell count, unspecified: Secondary | ICD-10-CM

## 2015-02-01 DIAGNOSIS — C679 Malignant neoplasm of bladder, unspecified: Secondary | ICD-10-CM | POA: Diagnosis not present

## 2015-02-01 DIAGNOSIS — R531 Weakness: Secondary | ICD-10-CM

## 2015-02-01 DIAGNOSIS — Z5111 Encounter for antineoplastic chemotherapy: Secondary | ICD-10-CM | POA: Diagnosis not present

## 2015-02-01 DIAGNOSIS — J449 Chronic obstructive pulmonary disease, unspecified: Secondary | ICD-10-CM

## 2015-02-01 DIAGNOSIS — R102 Pelvic and perineal pain: Secondary | ICD-10-CM

## 2015-02-01 DIAGNOSIS — D72820 Lymphocytosis (symptomatic): Secondary | ICD-10-CM

## 2015-02-01 DIAGNOSIS — D751 Secondary polycythemia: Secondary | ICD-10-CM | POA: Diagnosis not present

## 2015-02-01 DIAGNOSIS — I129 Hypertensive chronic kidney disease with stage 1 through stage 4 chronic kidney disease, or unspecified chronic kidney disease: Secondary | ICD-10-CM

## 2015-02-01 DIAGNOSIS — N189 Chronic kidney disease, unspecified: Secondary | ICD-10-CM

## 2015-02-01 DIAGNOSIS — Z9981 Dependence on supplemental oxygen: Secondary | ICD-10-CM

## 2015-02-01 DIAGNOSIS — M549 Dorsalgia, unspecified: Secondary | ICD-10-CM

## 2015-02-01 LAB — CBC WITH DIFFERENTIAL/PLATELET
BASOS ABS: 0 10*3/uL (ref 0–0.1)
BASOS PCT: 0 %
EOS ABS: 0 10*3/uL (ref 0–0.7)
EOS PCT: 1 %
HEMATOCRIT: 37.4 % (ref 35.0–47.0)
Hemoglobin: 12.2 g/dL (ref 12.0–16.0)
LYMPHS PCT: 45 %
Lymphs Abs: 2.2 10*3/uL (ref 1.0–3.6)
MCH: 28 pg (ref 26.0–34.0)
MCHC: 32.6 g/dL (ref 32.0–36.0)
MCV: 85.9 fL (ref 80.0–100.0)
MONO ABS: 0.8 10*3/uL (ref 0.2–0.9)
Monocytes Relative: 17 %
Neutro Abs: 1.8 10*3/uL (ref 1.4–6.5)
Neutrophils Relative %: 37 %
PLATELETS: 387 10*3/uL (ref 150–440)
RBC: 4.35 MIL/uL (ref 3.80–5.20)
RDW: 15.6 % — ABNORMAL HIGH (ref 11.5–14.5)
WBC: 4.9 10*3/uL (ref 3.6–11.0)

## 2015-02-01 LAB — HEPATIC FUNCTION PANEL
ALT: 14 U/L (ref 14–54)
AST: 22 U/L (ref 15–41)
Albumin: 3.1 g/dL — ABNORMAL LOW (ref 3.5–5.0)
Alkaline Phosphatase: 98 U/L (ref 38–126)
Bilirubin, Direct: <0.1 mg/dL — ABNORMAL LOW (ref 0.1–0.5)
TOTAL PROTEIN: 6.6 g/dL (ref 6.5–8.1)
Total Bilirubin: 0.3 mg/dL (ref 0.3–1.2)

## 2015-02-01 LAB — BASIC METABOLIC PANEL
Anion gap: 7 (ref 5–15)
BUN: 7 mg/dL (ref 6–20)
CALCIUM: 8.5 mg/dL — AB (ref 8.9–10.3)
CHLORIDE: 90 mmol/L — AB (ref 101–111)
CO2: 35 mmol/L — ABNORMAL HIGH (ref 22–32)
Creatinine, Ser: 0.84 mg/dL (ref 0.44–1.00)
GFR calc Af Amer: >60 mL/min (ref >60)
GFR calc non Af Amer: >60 mL/min (ref >60)
GLUCOSE: 133 mg/dL — AB (ref 65–99)
Potassium: 3.9 mmol/L (ref 3.5–5.1)
Sodium: 132 mmol/L — ABNORMAL LOW (ref 135–145)

## 2015-02-01 MED ORDER — SODIUM CHLORIDE 0.9 % IV SOLN
1450.0000 mg | Freq: Once | INTRAVENOUS | Status: AC
  Administered 2015-02-01: 1450 mg via INTRAVENOUS
  Filled 2015-02-01: qty 32.88

## 2015-02-01 MED ORDER — SODIUM CHLORIDE 0.9 % IV SOLN
210.0000 mg | Freq: Once | INTRAVENOUS | Status: AC
  Administered 2015-02-01: 210 mg via INTRAVENOUS
  Filled 2015-02-01: qty 21

## 2015-02-01 MED ORDER — HEPARIN SOD (PORK) LOCK FLUSH 100 UNIT/ML IV SOLN: 500.0000 [IU] | Freq: Once | INTRAVENOUS | Status: DC | PRN

## 2015-02-01 MED ORDER — SODIUM CHLORIDE 0.9 % IV SOLN
Freq: Once | INTRAVENOUS | Status: AC
  Administered 2015-02-01: 11:00:00 via INTRAVENOUS
  Filled 2015-02-01: qty 8

## 2015-02-01 MED ORDER — SODIUM CHLORIDE 0.9 % IV SOLN
Freq: Once | INTRAVENOUS | Status: AC
  Administered 2015-02-01: 11:00:00 via INTRAVENOUS
  Filled 2015-02-01: qty 1000
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
## 2015-02-08 ENCOUNTER — Inpatient Hospital Stay: Payer: Medicare Other

## 2015-02-08 ENCOUNTER — Other Ambulatory Visit: Payer: Medicare Other

## 2015-02-08 ENCOUNTER — Ambulatory Visit: Payer: Medicare Other

## 2015-02-08 DIAGNOSIS — C679 Malignant neoplasm of bladder, unspecified: Secondary | ICD-10-CM

## 2015-02-08 DIAGNOSIS — Z5111 Encounter for antineoplastic chemotherapy: Secondary | ICD-10-CM | POA: Diagnosis not present

## 2015-02-08 LAB — CBC WITH DIFFERENTIAL/PLATELET
Basophils Absolute: 0 10*3/uL (ref 0–0.1)
Basophils Relative: 1 %
Eosinophils Absolute: 0 10*3/uL (ref 0–0.7)
Eosinophils Relative: 1 %
HCT: 35 % (ref 35.0–47.0)
HEMOGLOBIN: 11.3 g/dL — AB (ref 12.0–16.0)
LYMPHS ABS: 2 10*3/uL (ref 1.0–3.6)
Lymphocytes Relative: 62 %
MCH: 28 pg (ref 26.0–34.0)
MCHC: 32.4 g/dL (ref 32.0–36.0)
MCV: 86.4 fL (ref 80.0–100.0)
Monocytes Absolute: 0.2 10*3/uL (ref 0.2–0.9)
Monocytes Relative: 6 %
NEUTROS PCT: 30 %
Neutro Abs: 1 10*3/uL — ABNORMAL LOW (ref 1.4–6.5)
PLATELETS: 371 10*3/uL (ref 150–440)
RBC: 4.05 MIL/uL (ref 3.80–5.20)
RDW: 15.8 % — ABNORMAL HIGH (ref 11.5–14.5)
WBC: 3.2 10*3/uL — AB (ref 3.6–11.0)

## 2015-02-08 LAB — BASIC METABOLIC PANEL
Anion gap: 4 — ABNORMAL LOW (ref 5–15)
BUN: 11 mg/dL (ref 6–20)
CHLORIDE: 91 mmol/L — AB (ref 101–111)
CO2: 35 mmol/L — ABNORMAL HIGH (ref 22–32)
Calcium: 8.6 mg/dL — ABNORMAL LOW (ref 8.9–10.3)
Creatinine, Ser: 0.78 mg/dL (ref 0.44–1.00)
GFR calc non Af Amer: >60 mL/min (ref >60)
GLUCOSE: 91 mg/dL (ref 65–99)
POTASSIUM: 4.5 mmol/L (ref 3.5–5.1)
Sodium: 130 mmol/L — ABNORMAL LOW (ref 135–145)

## 2015-02-08 MED ORDER — HEPARIN SOD (PORK) LOCK FLUSH 100 UNIT/ML IV SOLN
500.0000 [IU] | Freq: Once | INTRAVENOUS | Status: AC
  Administered 2015-02-08: 500 [IU] via INTRAVENOUS
  Filled 2015-02-08: qty 5

## 2015-02-08 MED ORDER — SODIUM CHLORIDE 0.9 % IJ SOLN
10.0000 mL | INTRAMUSCULAR | Status: DC | PRN
  Filled 2015-02-08: qty 10

## 2015-02-17 ENCOUNTER — Other Ambulatory Visit: Payer: Self-pay | Admitting: Family Medicine

## 2015-02-17 MED ORDER — FUROSEMIDE 20 MG PO TABS: 20.0000 mg | ORAL_TABLET | Freq: Every day | ORAL | Status: DC

## 2015-02-22 ENCOUNTER — Telehealth: Payer: Self-pay | Admitting: *Deleted

## 2015-02-22 ENCOUNTER — Inpatient Hospital Stay (HOSPITAL_BASED_OUTPATIENT_CLINIC_OR_DEPARTMENT_OTHER): Payer: Medicare Other | Admitting: Internal Medicine

## 2015-02-22 ENCOUNTER — Inpatient Hospital Stay: Payer: Medicare Other | Attending: Internal Medicine

## 2015-02-22 ENCOUNTER — Encounter: Payer: Self-pay | Admitting: Internal Medicine

## 2015-02-22 ENCOUNTER — Inpatient Hospital Stay: Payer: Medicare Other

## 2015-02-22 VITALS — BP 129/82 | HR 87 | Temp 98.2°F | Resp 20 | Ht 67.0 in | Wt 153.7 lb

## 2015-02-22 DIAGNOSIS — M069 Rheumatoid arthritis, unspecified: Secondary | ICD-10-CM | POA: Insufficient documentation

## 2015-02-22 DIAGNOSIS — F418 Other specified anxiety disorders: Secondary | ICD-10-CM | POA: Insufficient documentation

## 2015-02-22 DIAGNOSIS — Z9981 Dependence on supplemental oxygen: Secondary | ICD-10-CM | POA: Diagnosis not present

## 2015-02-22 DIAGNOSIS — M549 Dorsalgia, unspecified: Secondary | ICD-10-CM

## 2015-02-22 DIAGNOSIS — Z5111 Encounter for antineoplastic chemotherapy: Secondary | ICD-10-CM | POA: Insufficient documentation

## 2015-02-22 DIAGNOSIS — I129 Hypertensive chronic kidney disease with stage 1 through stage 4 chronic kidney disease, or unspecified chronic kidney disease: Secondary | ICD-10-CM

## 2015-02-22 DIAGNOSIS — Z79899 Other long term (current) drug therapy: Secondary | ICD-10-CM

## 2015-02-22 DIAGNOSIS — J449 Chronic obstructive pulmonary disease, unspecified: Secondary | ICD-10-CM

## 2015-02-22 DIAGNOSIS — F17210 Nicotine dependence, cigarettes, uncomplicated: Secondary | ICD-10-CM | POA: Insufficient documentation

## 2015-02-22 DIAGNOSIS — C679 Malignant neoplasm of bladder, unspecified: Secondary | ICD-10-CM | POA: Insufficient documentation

## 2015-02-22 DIAGNOSIS — M797 Fibromyalgia: Secondary | ICD-10-CM | POA: Diagnosis not present

## 2015-02-22 DIAGNOSIS — K219 Gastro-esophageal reflux disease without esophagitis: Secondary | ICD-10-CM | POA: Diagnosis not present

## 2015-02-22 DIAGNOSIS — N189 Chronic kidney disease, unspecified: Secondary | ICD-10-CM | POA: Insufficient documentation

## 2015-02-22 LAB — CBC WITH DIFFERENTIAL/PLATELET
BASOS PCT: 1 %
Basophils Absolute: 0.1 10*3/uL (ref 0–0.1)
EOS PCT: 4 %
Eosinophils Absolute: 0.4 10*3/uL (ref 0–0.7)
HCT: 38.6 % (ref 35.0–47.0)
HEMOGLOBIN: 12.5 g/dL (ref 12.0–16.0)
LYMPHS ABS: 1.9 10*3/uL (ref 1.0–3.6)
LYMPHS PCT: 25 %
MCH: 28.8 pg (ref 26.0–34.0)
MCHC: 32.3 g/dL (ref 32.0–36.0)
MCV: 89.1 fL (ref 80.0–100.0)
Monocytes Absolute: 1.4 10*3/uL — ABNORMAL HIGH (ref 0.2–0.9)
Monocytes Relative: 17 %
Neutro Abs: 4.2 10*3/uL (ref 1.4–6.5)
Neutrophils Relative %: 53 %
Platelets: 383 10*3/uL (ref 150–440)
RBC: 4.33 MIL/uL (ref 3.80–5.20)
RDW: 18.7 % — ABNORMAL HIGH (ref 11.5–14.5)
WBC: 8 10*3/uL (ref 3.6–11.0)

## 2015-02-22 LAB — BASIC METABOLIC PANEL
Anion gap: 5 (ref 5–15)
BUN: 10 mg/dL (ref 6–20)
CO2: 38 mmol/L — AB (ref 22–32)
CREATININE: 0.73 mg/dL (ref 0.44–1.00)
Calcium: 8.3 mg/dL — ABNORMAL LOW (ref 8.9–10.3)
Chloride: 88 mmol/L — ABNORMAL LOW (ref 101–111)
GFR calc Af Amer: >60 mL/min (ref >60)
GLUCOSE: 95 mg/dL (ref 65–99)
Potassium: 3.9 mmol/L (ref 3.5–5.1)
SODIUM: 131 mmol/L — AB (ref 135–145)

## 2015-02-22 MED ORDER — SODIUM CHLORIDE 0.9 % IV SOLN
175.6000 mg | Freq: Once | INTRAVENOUS | Status: AC
  Administered 2015-02-22: 180 mg via INTRAVENOUS
  Filled 2015-02-22: qty 18

## 2015-02-22 MED ORDER — DULOXETINE HCL 20 MG PO CPEP: 20.0000 mg | ORAL_CAPSULE | Freq: Every day | ORAL | Status: DC

## 2015-02-22 MED ORDER — AZITHROMYCIN 250 MG PO TABS: ORAL_TABLET | ORAL | Status: DC

## 2015-02-22 MED ORDER — GEMCITABINE HCL CHEMO INJECTION 1 GM/26.3ML
1450.0000 mg | Freq: Once | INTRAVENOUS | Status: AC
  Administered 2015-02-22: 1450 mg via INTRAVENOUS
  Filled 2015-02-22: qty 32.88

## 2015-02-22 MED ORDER — HEPARIN SOD (PORK) LOCK FLUSH 100 UNIT/ML IV SOLN
500.0000 [IU] | Freq: Once | INTRAVENOUS | Status: AC | PRN
  Administered 2015-02-22: 500 [IU]
  Filled 2015-02-22: qty 5

## 2015-02-22 MED ORDER — BENZONATATE 100 MG PO CAPS: 100.0000 mg | ORAL_CAPSULE | Freq: Three times a day (TID) | ORAL | Status: DC

## 2015-02-22 MED ORDER — SODIUM CHLORIDE 0.9 % IV SOLN
Freq: Once | INTRAVENOUS | Status: AC
  Administered 2015-02-22: 11:00:00 via INTRAVENOUS
  Filled 2015-02-22: qty 1000

## 2015-02-22 MED ORDER — PROCHLORPERAZINE MALEATE 10 MG PO TABS
10.0000 mg | ORAL_TABLET | Freq: Once | ORAL | Status: AC
Start: 2015-02-22 — End: 2015-02-22
  Administered 2015-02-22: 10 mg via ORAL
  Filled 2015-02-22: qty 1

## 2015-02-22 MED ORDER — MAGIC MOUTHWASH: 5.0000 mL | Freq: Four times a day (QID) | ORAL | Status: DC | PRN

## 2015-02-22 NOTE — Progress Notes (Signed)
Patient states that last week she went almost  three days without urinating. She states that Dr. Ma Hillock had mentioned to her about seeing Dr. Erlene Quan in urology and she wants to know when the appointment is going to be.

## 2015-02-22 NOTE — Telephone Encounter (Signed)
Pt here in office and pt needs meds called in for her.  Per [place] call in [place], MMW, tessalon pearls, cymbalta.  Called medications to her pharmacy
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
## 2015-03-01 ENCOUNTER — Ambulatory Visit (INDEPENDENT_AMBULATORY_CARE_PROVIDER_SITE_OTHER): Payer: Medicare Other | Admitting: Internal Medicine

## 2015-03-01 ENCOUNTER — Inpatient Hospital Stay: Payer: Medicare Other

## 2015-03-01 ENCOUNTER — Encounter: Payer: Self-pay | Admitting: Internal Medicine

## 2015-03-01 ENCOUNTER — Other Ambulatory Visit: Payer: Self-pay | Admitting: *Deleted

## 2015-03-01 VITALS — BP 110/80 | HR 95 | Temp 97.4°F | Ht 67.0 in | Wt 154.0 lb

## 2015-03-01 DIAGNOSIS — C679 Malignant neoplasm of bladder, unspecified: Secondary | ICD-10-CM

## 2015-03-01 DIAGNOSIS — R059 Cough, unspecified: Secondary | ICD-10-CM

## 2015-03-01 DIAGNOSIS — R05 Cough: Secondary | ICD-10-CM | POA: Diagnosis not present

## 2015-03-01 DIAGNOSIS — Z720 Tobacco use: Secondary | ICD-10-CM

## 2015-03-01 DIAGNOSIS — J449 Chronic obstructive pulmonary disease, unspecified: Secondary | ICD-10-CM | POA: Diagnosis not present

## 2015-03-01 DIAGNOSIS — J9611 Chronic respiratory failure with hypoxia: Secondary | ICD-10-CM

## 2015-03-01 DIAGNOSIS — Z5111 Encounter for antineoplastic chemotherapy: Secondary | ICD-10-CM | POA: Diagnosis not present

## 2015-03-01 LAB — CBC WITH DIFFERENTIAL/PLATELET
Basophils Absolute: 0.1 10*3/uL (ref 0–0.1)
Basophils Relative: 2 %
EOS ABS: 0.1 10*3/uL (ref 0–0.7)
EOS PCT: 2 %
HEMATOCRIT: 37.6 % (ref 35.0–47.0)
Hemoglobin: 12.3 g/dL (ref 12.0–16.0)
LYMPHS PCT: 53 %
Lymphs Abs: 1.7 10*3/uL (ref 1.0–3.6)
MCH: 29.1 pg (ref 26.0–34.0)
MCHC: 32.8 g/dL (ref 32.0–36.0)
MCV: 88.9 fL (ref 80.0–100.0)
MONOS PCT: 9 %
Monocytes Absolute: 0.3 10*3/uL (ref 0.2–0.9)
NEUTROS PCT: 34 %
Neutro Abs: 1.1 10*3/uL — ABNORMAL LOW (ref 1.4–6.5)
PLATELETS: 173 10*3/uL (ref 150–440)
RBC: 4.23 MIL/uL (ref 3.80–5.20)
RDW: 20.1 % — AB (ref 11.5–14.5)
WBC: 3.2 10*3/uL — ABNORMAL LOW (ref 3.6–11.0)

## 2015-03-01 LAB — CREATININE, SERUM
CREATININE: 0.69 mg/dL (ref 0.44–1.00)
GFR calc non Af Amer: >60 mL/min (ref >60)

## 2015-03-01 MED ORDER — HEPARIN SOD (PORK) LOCK FLUSH 100 UNIT/ML IV SOLN
INTRAVENOUS | Status: AC
Start: 2015-03-01 — End: 2015-03-01
  Filled 2015-03-01: qty 5

## 2015-03-01 MED ORDER — SODIUM CHLORIDE 0.9 % IJ SOLN
10.0000 mL | INTRAMUSCULAR | Status: DC | PRN
  Administered 2015-03-01: 10 mL
  Filled 2015-03-01: qty 10

## 2015-03-01 MED ORDER — HEPARIN SOD (PORK) LOCK FLUSH 100 UNIT/ML IV SOLN
500.0000 [IU] | Freq: Once | INTRAVENOUS | Status: AC
  Administered 2015-03-01: 500 [IU] via INTRAVENOUS

## 2015-03-01 NOTE — Assessment & Plan Note (Signed)
Secondary to COPD (moderate to severe), tobacco abuse.  Plan: -Continue with 3 L supplement oxygen continuously to maintain saturations greater than 88%

## 2015-03-01 NOTE — Assessment & Plan Note (Addendum)
Patient with known history of COPD, FEV1 50% predicted, CAT score 28, mMRC 4. Patient with moderate to severe asthma, she will benefit from dual therapy with Spiriva and Advair or Symbicort. However, she can't afford LABA/ICS at this time.  We have provided patient information for patient assistance with Advair. Patient is advised to continue with her current COPD regiment which includes 3 L of oxygen continuously, Spiriva, ProAir as needed,  Patient counseled heavily today on avoidance and cessation of tobacco. She is currently on Gemzar for bladder cancer, which has shown in studies to cause worsening dyspnea in 23% of patients and flu like symptoms in 19% of patients.  Being on this form of chemotherapy with underlying structural lung disease and continued smoking can and will make her clinical symptoms/status worse from a pulmonary standpoint. I have explained this to the patient and explained importance of tobacco avoidance also.  Plan: -Continue with current COPD regiment -Continue with tobacco reduction and eventual tobacco cessation -Continue with exercise as tolerated -Pulmonary function testing and 6 minute walk test prior to follow-up visit

## 2015-03-01 NOTE — Assessment & Plan Note (Signed)
Tobacco Cessation - Counseling regarding benefits of smoking cessation strategies was provided for more than 12 min. - Educated that at this time smoking- cessation represents the single most important step that patient can take to enhance the length and quality of live. - Educated patient regarding alternatives of behavior interventions, pharmacotherapy including NRT and non-nicotine therapy such, and combinations of both. - Patient at this time will try to quit on her own, she has tried nicotine patches in the past but rebounded quickly back to smoking.

## 2015-03-01 NOTE — Patient Instructions (Addendum)
Follow up with Dr. Stevenson Clinch in 3 months - pulmonary function testing and 6 minute walk test prior to follow up - Advair 500/50 - 1 puff in the am and 1puff in the PM. -gargle and rinse after each use.  - we will give you information for advair patient assistance, please fill out and turn it in - please stop smoking

## 2015-03-01 NOTE — Assessment & Plan Note (Signed)
Multifactorial: Due to recent respiratory illness, COPD, deconditioning. Now in resolving phase. Will continue with supportive care at this time
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
## 2015-03-03 ENCOUNTER — Telehealth: Payer: Self-pay | Admitting: *Deleted

## 2015-03-03 NOTE — Telephone Encounter (Signed)
Patient of Dr. Hortense Ramal bladder cancer.  States she is having painful urination with itching and burning.  Painful when starting stream and when finishing.  Also c/o yellow discharge on tissue.  Can she use OTC monistat or does she need something stronger.  States she finished abx last Sunday.  Uses [facility]

## 2015-03-08 ENCOUNTER — Telehealth: Payer: Self-pay | Admitting: *Deleted

## 2015-03-08 ENCOUNTER — Ambulatory Visit
Admission: RE | Admit: 2015-03-08 | Discharge: 2015-03-08 | Disposition: A | Discharge status: Home / Self Care | Payer: Medicare Other | Source: Ambulatory Visit | Attending: Oncology | Admitting: Oncology

## 2015-03-08 ENCOUNTER — Inpatient Hospital Stay: Payer: Medicare Other

## 2015-03-08 ENCOUNTER — Other Ambulatory Visit: Payer: Self-pay | Admitting: *Deleted

## 2015-03-08 VITALS — BP 138/79 | HR 90 | Temp 97.9°F | Resp 20

## 2015-03-08 DIAGNOSIS — M79642 Pain in left hand: Secondary | ICD-10-CM

## 2015-03-08 DIAGNOSIS — M19042 Primary osteoarthritis, left hand: Secondary | ICD-10-CM | POA: Insufficient documentation

## 2015-03-08 DIAGNOSIS — M25571 Pain in right ankle and joints of right foot: Secondary | ICD-10-CM | POA: Diagnosis present

## 2015-03-08 DIAGNOSIS — M25572 Pain in left ankle and joints of left foot: Secondary | ICD-10-CM

## 2015-03-08 DIAGNOSIS — C679 Malignant neoplasm of bladder, unspecified: Secondary | ICD-10-CM | POA: Insufficient documentation

## 2015-03-08 DIAGNOSIS — J449 Chronic obstructive pulmonary disease, unspecified: Secondary | ICD-10-CM | POA: Insufficient documentation

## 2015-03-08 DIAGNOSIS — R0781 Pleurodynia: Secondary | ICD-10-CM

## 2015-03-08 DIAGNOSIS — Z5111 Encounter for antineoplastic chemotherapy: Secondary | ICD-10-CM | POA: Diagnosis not present

## 2015-03-08 DIAGNOSIS — R918 Other nonspecific abnormal finding of lung field: Secondary | ICD-10-CM | POA: Diagnosis not present

## 2015-03-08 DIAGNOSIS — M25471 Effusion, right ankle: Secondary | ICD-10-CM | POA: Diagnosis not present

## 2015-03-08 LAB — CBC WITH DIFFERENTIAL/PLATELET
BASOS ABS: 0 10*3/uL (ref 0–0.1)
BASOS PCT: 1 %
EOS ABS: 0 10*3/uL (ref 0–0.7)
Eosinophils Relative: 0 %
HEMATOCRIT: 37.3 % (ref 35.0–47.0)
Hemoglobin: 12.2 g/dL (ref 12.0–16.0)
LYMPHS PCT: 15 %
Lymphs Abs: 1.1 10*3/uL (ref 1.0–3.6)
MCH: 29.9 pg (ref 26.0–34.0)
MCHC: 32.6 g/dL (ref 32.0–36.0)
MCV: 91.6 fL (ref 80.0–100.0)
MONO ABS: 1.4 10*3/uL — AB (ref 0.2–0.9)
Monocytes Relative: 18 %
NEUTROS ABS: 5.1 10*3/uL (ref 1.4–6.5)
NEUTROS PCT: 66 %
PLATELETS: 244 10*3/uL (ref 150–440)
RBC: 4.07 MIL/uL (ref 3.80–5.20)
RDW: 22.7 % — AB (ref 11.5–14.5)
WBC: 7.7 10*3/uL (ref 3.6–11.0)

## 2015-03-08 LAB — CREATININE, SERUM: CREATININE: 0.82 mg/dL (ref 0.44–1.00)

## 2015-03-08 MED ORDER — HEPARIN SOD (PORK) LOCK FLUSH 100 UNIT/ML IV SOLN
500.0000 [IU] | Freq: Once | INTRAVENOUS | Status: AC | PRN
  Administered 2015-03-08: 500 [IU]

## 2015-03-08 MED ORDER — SODIUM CHLORIDE 0.9 % IV SOLN
1450.0000 mg | Freq: Once | INTRAVENOUS | Status: AC
  Administered 2015-03-08: 1450 mg via INTRAVENOUS
  Filled 2015-03-08: qty 38.16

## 2015-03-08 MED ORDER — SODIUM CHLORIDE 0.9 % IJ SOLN
10.0000 mL | INTRAMUSCULAR | Status: DC | PRN
  Administered 2015-03-08: 10 mL
  Filled 2015-03-08: qty 10

## 2015-03-08 MED ORDER — SODIUM CHLORIDE 0.9 % IV SOLN
175.6000 mg | Freq: Once | INTRAVENOUS | Status: AC
  Administered 2015-03-08: 180 mg via INTRAVENOUS
  Filled 2015-03-08: qty 18

## 2015-03-08 MED ORDER — SODIUM CHLORIDE 0.9 % IV SOLN
Freq: Once | INTRAVENOUS | Status: AC
  Administered 2015-03-08: 11:00:00 via INTRAVENOUS
  Filled 2015-03-08: qty 1000

## 2015-03-08 MED ORDER — SODIUM CHLORIDE 0.9 % IV SOLN
Freq: Once | INTRAVENOUS | Status: AC
  Administered 2015-03-08: 11:00:00 via INTRAVENOUS
  Filled 2015-03-08: qty 8

## 2015-03-08 NOTE — Telephone Encounter (Signed)
Pt in chemo area earlier today and had fell over the weekend and refused to go to ER.  She has lots of pain to left side at rib area and is bruised and pain is worse on deep breaths and coughing esp.  She also has swollen and bruised right ankle/foot, left ankle slightly swollen and no bruising and both hurt. Left hand has bruising and some swelling and pt has arthriitic fingers on left 3rd and 4th digits.  She and her daughter requests that she get rib xray, bilateral ankle xray and left hand xray.  I ordered them and Dr. Oliva Bustard read results and suggest that she has a sprain to left ankle and she should see orthopedic and probably get a boot.  All the rest of xrays have no fractures what so ever but did tell pt that hand has a lot of arthritis and right ankle/foot has swelling.  Called pt to give all the above results.  She already has left ankle in ace bandage and she has fallen before and has orthopedic boot and will put it on.  Also the cxray indicated she was not taking big breaths  Mainly because of the pain and she needs to take deeper breaths several times a day to keep from getting pneumonia esp. Because she has COPD. She has IS at home and advised to use it 4-5 times a day and have pillow handy to try to hold it to the side so she may not have as much pain.  She is agreeable to this and her daughter got on the phone and I gave same info to her.  Patient also mentioned that she feels like she has yeast infection from [place] she had and Dr. Oliva Bustard agreeable to diflucan and it was called in and pt aware of this also.

## 2015-03-09 ENCOUNTER — Other Ambulatory Visit: Payer: Self-pay | Admitting: Internal Medicine

## 2015-03-09 MED ORDER — FLUTICASONE-SALMETEROL 500-50 MCG/DOSE IN AEPB: 1.0000 | INHALATION_SPRAY | Freq: Two times a day (BID) | RESPIRATORY_TRACT | Status: DC

## 2015-03-10 ENCOUNTER — Telehealth: Payer: Self-pay | Admitting: *Deleted

## 2015-03-10 NOTE — Telephone Encounter (Signed)
Drug rep will be here around 12 noon with sample. Left message that patient may come by to pick up this afternoon. Sample will be left at front desk. Pt may call office if she has any questions or concerns.

## 2015-03-14 ENCOUNTER — Other Ambulatory Visit: Payer: Self-pay | Admitting: Internal Medicine

## 2015-03-14 ENCOUNTER — Other Ambulatory Visit: Payer: Self-pay | Admitting: *Deleted

## 2015-03-14 MED ORDER — FLUTICASONE-SALMETEROL 500-50 MCG/DOSE IN AEPB: 1.0000 | INHALATION_SPRAY | Freq: Two times a day (BID) | RESPIRATORY_TRACT | Status: DC

## 2015-03-15 ENCOUNTER — Inpatient Hospital Stay: Payer: Medicare Other

## 2015-03-15 ENCOUNTER — Inpatient Hospital Stay: Payer: Medicare Other | Attending: Internal Medicine

## 2015-03-15 ENCOUNTER — Inpatient Hospital Stay (HOSPITAL_BASED_OUTPATIENT_CLINIC_OR_DEPARTMENT_OTHER): Payer: Medicare Other | Admitting: Internal Medicine

## 2015-03-15 ENCOUNTER — Other Ambulatory Visit: Payer: Self-pay | Admitting: *Deleted

## 2015-03-15 VITALS — BP 133/83 | HR 98 | Temp 98.1°F | Resp 18 | Ht 67.0 in | Wt 154.8 lb

## 2015-03-15 DIAGNOSIS — Z79899 Other long term (current) drug therapy: Secondary | ICD-10-CM | POA: Insufficient documentation

## 2015-03-15 DIAGNOSIS — F419 Anxiety disorder, unspecified: Secondary | ICD-10-CM | POA: Insufficient documentation

## 2015-03-15 DIAGNOSIS — N39498 Other specified urinary incontinence: Secondary | ICD-10-CM

## 2015-03-15 DIAGNOSIS — D751 Secondary polycythemia: Secondary | ICD-10-CM | POA: Diagnosis not present

## 2015-03-15 DIAGNOSIS — G8929 Other chronic pain: Secondary | ICD-10-CM

## 2015-03-15 DIAGNOSIS — M549 Dorsalgia, unspecified: Secondary | ICD-10-CM | POA: Insufficient documentation

## 2015-03-15 DIAGNOSIS — Z801 Family history of malignant neoplasm of trachea, bronchus and lung: Secondary | ICD-10-CM | POA: Insufficient documentation

## 2015-03-15 DIAGNOSIS — Z5111 Encounter for antineoplastic chemotherapy: Secondary | ICD-10-CM | POA: Insufficient documentation

## 2015-03-15 DIAGNOSIS — I129 Hypertensive chronic kidney disease with stage 1 through stage 4 chronic kidney disease, or unspecified chronic kidney disease: Secondary | ICD-10-CM | POA: Diagnosis not present

## 2015-03-15 DIAGNOSIS — N189 Chronic kidney disease, unspecified: Secondary | ICD-10-CM | POA: Diagnosis not present

## 2015-03-15 DIAGNOSIS — C679 Malignant neoplasm of bladder, unspecified: Secondary | ICD-10-CM | POA: Diagnosis not present

## 2015-03-15 DIAGNOSIS — D72820 Lymphocytosis (symptomatic): Secondary | ICD-10-CM | POA: Insufficient documentation

## 2015-03-15 DIAGNOSIS — J449 Chronic obstructive pulmonary disease, unspecified: Secondary | ICD-10-CM | POA: Insufficient documentation

## 2015-03-15 DIAGNOSIS — D72829 Elevated white blood cell count, unspecified: Secondary | ICD-10-CM | POA: Diagnosis not present

## 2015-03-15 DIAGNOSIS — K219 Gastro-esophageal reflux disease without esophagitis: Secondary | ICD-10-CM | POA: Insufficient documentation

## 2015-03-15 DIAGNOSIS — Z9981 Dependence on supplemental oxygen: Secondary | ICD-10-CM | POA: Diagnosis not present

## 2015-03-15 DIAGNOSIS — F17210 Nicotine dependence, cigarettes, uncomplicated: Secondary | ICD-10-CM | POA: Diagnosis not present

## 2015-03-15 DIAGNOSIS — Z803 Family history of malignant neoplasm of breast: Secondary | ICD-10-CM | POA: Diagnosis not present

## 2015-03-15 DIAGNOSIS — M1990 Unspecified osteoarthritis, unspecified site: Secondary | ICD-10-CM | POA: Insufficient documentation

## 2015-03-15 DIAGNOSIS — M797 Fibromyalgia: Secondary | ICD-10-CM

## 2015-03-15 DIAGNOSIS — E1122 Type 2 diabetes mellitus with diabetic chronic kidney disease: Secondary | ICD-10-CM

## 2015-03-15 DIAGNOSIS — Z418 Encounter for other procedures for purposes other than remedying health state: Secondary | ICD-10-CM | POA: Insufficient documentation

## 2015-03-15 LAB — CBC WITH DIFFERENTIAL/PLATELET
BASOS ABS: 0 10*3/uL (ref 0–0.1)
BASOS PCT: 1 %
EOS PCT: 1 %
Eosinophils Absolute: 0 10*3/uL (ref 0–0.7)
HEMATOCRIT: 33.7 % — AB (ref 35.0–47.0)
Hemoglobin: 11.4 g/dL — ABNORMAL LOW (ref 12.0–16.0)
LYMPHS PCT: 69 %
Lymphs Abs: 1.6 10*3/uL (ref 1.0–3.6)
MCH: 30.5 pg (ref 26.0–34.0)
MCHC: 33.9 g/dL (ref 32.0–36.0)
MCV: 90 fL (ref 80.0–100.0)
MONOS PCT: 7 %
Monocytes Absolute: 0.2 10*3/uL (ref 0.2–0.9)
Neutro Abs: 0.5 10*3/uL — ABNORMAL LOW (ref 1.4–6.5)
Neutrophils Relative %: 22 %
PLATELETS: 272 10*3/uL (ref 150–440)
RBC: 3.74 MIL/uL — ABNORMAL LOW (ref 3.80–5.20)
RDW: 23.5 % — ABNORMAL HIGH (ref 11.5–14.5)
WBC: 2.3 10*3/uL — AB (ref 3.6–11.0)

## 2015-03-15 LAB — COMPREHENSIVE METABOLIC PANEL
ALBUMIN: 3 g/dL — AB (ref 3.5–5.0)
ALK PHOS: 92 U/L (ref 38–126)
ALT: 60 U/L — AB (ref 14–54)
ANION GAP: 4 — AB (ref 5–15)
AST: 94 U/L — AB (ref 15–41)
BILIRUBIN TOTAL: 0.4 mg/dL (ref 0.3–1.2)
BUN: 12 mg/dL (ref 6–20)
CALCIUM: 9.2 mg/dL (ref 8.9–10.3)
CHLORIDE: 89 mmol/L — AB (ref 101–111)
CO2: 36 mmol/L — ABNORMAL HIGH (ref 22–32)
Creatinine, Ser: 0.73 mg/dL (ref 0.44–1.00)
GFR calc non Af Amer: >60 mL/min (ref >60)
GLUCOSE: 121 mg/dL — AB (ref 65–99)
Potassium: 3.5 mmol/L (ref 3.5–5.1)
SODIUM: 129 mmol/L — AB (ref 135–145)
Total Protein: 6.7 g/dL (ref 6.5–8.1)

## 2015-03-15 MED ORDER — HEPARIN SOD (PORK) LOCK FLUSH 100 UNIT/ML IV SOLN
500.0000 [IU] | Freq: Once | INTRAVENOUS | Status: AC | PRN
  Administered 2015-03-15: 500 [IU]

## 2015-03-15 MED ORDER — HEPARIN SOD (PORK) LOCK FLUSH 100 UNIT/ML IV SOLN
INTRAVENOUS | Status: AC
  Filled 2015-03-15: qty 5

## 2015-03-15 MED ORDER — TBO-FILGRASTIM 300 MCG/0.5ML ~~LOC~~ SOSY
300.0000 ug | PREFILLED_SYRINGE | Freq: Once | SUBCUTANEOUS | Status: AC
  Administered 2015-03-15: 300 ug via SUBCUTANEOUS
  Filled 2015-03-15: qty 0.5

## 2015-03-15 MED ORDER — TBO-FILGRASTIM 300 MCG/0.5ML ~~LOC~~ SOSY: 300.0000 ug | PREFILLED_SYRINGE | Freq: Once | SUBCUTANEOUS | Status: DC

## 2015-03-15 MED ORDER — LEVOFLOXACIN 500 MG PO TABS: 500.0000 mg | ORAL_TABLET | Freq: Every day | ORAL | Status: DC

## 2015-03-15 MED ORDER — TBO-FILGRASTIM 300 MCG/0.5ML ~~LOC~~ SOSY
300.0000 ug | PREFILLED_SYRINGE | Freq: Once | SUBCUTANEOUS | Status: DC
  Filled 2015-03-15: qty 0.5

## 2015-03-15 NOTE — Progress Notes (Signed)
Patient states that she aches all over like she has the flu. She states that she has a lot of constant pain in her back. She states that her breathing has been pretty good and she describes her appetite has been fair.

## 2015-03-16 ENCOUNTER — Other Ambulatory Visit: Payer: Self-pay | Admitting: *Deleted

## 2015-03-16 ENCOUNTER — Inpatient Hospital Stay: Payer: Medicare Other

## 2015-03-16 VITALS — BP 118/72 | HR 92 | Temp 98.0°F | Resp 18

## 2015-03-16 DIAGNOSIS — Z5111 Encounter for antineoplastic chemotherapy: Secondary | ICD-10-CM | POA: Diagnosis not present

## 2015-03-16 DIAGNOSIS — C679 Malignant neoplasm of bladder, unspecified: Secondary | ICD-10-CM

## 2015-03-16 DIAGNOSIS — D709 Neutropenia, unspecified: Secondary | ICD-10-CM

## 2015-03-16 MED ORDER — TBO-FILGRASTIM 300 MCG/0.5ML ~~LOC~~ SOSY: 300.0000 ug | PREFILLED_SYRINGE | Freq: Once | SUBCUTANEOUS | Status: DC

## 2015-03-16 MED ORDER — TBO-FILGRASTIM 300 MCG/0.5ML ~~LOC~~ SOSY
300.0000 ug | PREFILLED_SYRINGE | Freq: Once | SUBCUTANEOUS | Status: AC
  Administered 2015-03-16: 300 ug via SUBCUTANEOUS

## 2015-03-17 ENCOUNTER — Inpatient Hospital Stay: Payer: Medicare Other

## 2015-03-17 DIAGNOSIS — D702 Other drug-induced agranulocytosis: Secondary | ICD-10-CM

## 2015-03-17 DIAGNOSIS — C679 Malignant neoplasm of bladder, unspecified: Secondary | ICD-10-CM

## 2015-03-17 DIAGNOSIS — Z5111 Encounter for antineoplastic chemotherapy: Secondary | ICD-10-CM | POA: Diagnosis not present

## 2015-03-17 LAB — CBC WITH DIFFERENTIAL/PLATELET
Basophils Absolute: 0 10*3/uL (ref 0–0.1)
Basophils Relative: 0 %
Eosinophils Absolute: 0 10*3/uL (ref 0–0.7)
Eosinophils Relative: 0 %
HCT: 34.5 % — ABNORMAL LOW (ref 35.0–47.0)
Hemoglobin: 11.5 g/dL — ABNORMAL LOW (ref 12.0–16.0)
LYMPHS ABS: 2 10*3/uL (ref 1.0–3.6)
Lymphocytes Relative: 9 %
MCH: 30.3 pg (ref 26.0–34.0)
MCHC: 33.4 g/dL (ref 32.0–36.0)
MCV: 90.6 fL (ref 80.0–100.0)
MONOS PCT: 5 %
Monocytes Absolute: 1 10*3/uL — ABNORMAL HIGH (ref 0.2–0.9)
Neutro Abs: 18.5 10*3/uL — ABNORMAL HIGH (ref 1.4–6.5)
Neutrophils Relative %: 86 %
PLATELETS: 173 10*3/uL (ref 150–440)
RBC: 3.81 MIL/uL (ref 3.80–5.20)
RDW: 23.7 % — ABNORMAL HIGH (ref 11.5–14.5)
WBC: 21.6 10*3/uL — ABNORMAL HIGH (ref 3.6–11.0)

## 2015-03-17 MED ORDER — TBO-FILGRASTIM 300 MCG/0.5ML ~~LOC~~ SOSY
300.0000 ug | PREFILLED_SYRINGE | Freq: Once | SUBCUTANEOUS | Status: AC
Start: 2015-03-17 — End: 2015-03-17
  Administered 2015-03-17: 300 ug via SUBCUTANEOUS
  Filled 2015-03-17: qty 0.5
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
## 2015-03-22 ENCOUNTER — Inpatient Hospital Stay (HOSPITAL_BASED_OUTPATIENT_CLINIC_OR_DEPARTMENT_OTHER): Payer: Medicare Other | Admitting: Internal Medicine

## 2015-03-22 ENCOUNTER — Inpatient Hospital Stay: Payer: Medicare Other

## 2015-03-22 VITALS — BP 127/81 | HR 101 | Temp 97.8°F

## 2015-03-22 DIAGNOSIS — N200 Calculus of kidney: Secondary | ICD-10-CM | POA: Insufficient documentation

## 2015-03-22 DIAGNOSIS — S299XXA Unspecified injury of thorax, initial encounter: Secondary | ICD-10-CM | POA: Insufficient documentation

## 2015-03-22 DIAGNOSIS — I129 Hypertensive chronic kidney disease with stage 1 through stage 4 chronic kidney disease, or unspecified chronic kidney disease: Secondary | ICD-10-CM

## 2015-03-22 DIAGNOSIS — J449 Chronic obstructive pulmonary disease, unspecified: Secondary | ICD-10-CM

## 2015-03-22 DIAGNOSIS — D72820 Lymphocytosis (symptomatic): Secondary | ICD-10-CM

## 2015-03-22 DIAGNOSIS — K2970 Gastritis, unspecified, without bleeding: Secondary | ICD-10-CM | POA: Insufficient documentation

## 2015-03-22 DIAGNOSIS — C539 Malignant neoplasm of cervix uteri, unspecified: Secondary | ICD-10-CM | POA: Insufficient documentation

## 2015-03-22 DIAGNOSIS — E1122 Type 2 diabetes mellitus with diabetic chronic kidney disease: Secondary | ICD-10-CM

## 2015-03-22 DIAGNOSIS — R928 Other abnormal and inconclusive findings on diagnostic imaging of breast: Secondary | ICD-10-CM | POA: Insufficient documentation

## 2015-03-22 DIAGNOSIS — D751 Secondary polycythemia: Secondary | ICD-10-CM | POA: Insufficient documentation

## 2015-03-22 DIAGNOSIS — G894 Chronic pain syndrome: Secondary | ICD-10-CM | POA: Insufficient documentation

## 2015-03-22 DIAGNOSIS — K660 Peritoneal adhesions (postprocedural) (postinfection): Secondary | ICD-10-CM | POA: Insufficient documentation

## 2015-03-22 DIAGNOSIS — F32A Depression, unspecified: Secondary | ICD-10-CM | POA: Insufficient documentation

## 2015-03-22 DIAGNOSIS — N390 Urinary tract infection, site not specified: Secondary | ICD-10-CM | POA: Insufficient documentation

## 2015-03-22 DIAGNOSIS — Z5111 Encounter for antineoplastic chemotherapy: Secondary | ICD-10-CM | POA: Diagnosis not present

## 2015-03-22 DIAGNOSIS — D649 Anemia, unspecified: Secondary | ICD-10-CM | POA: Insufficient documentation

## 2015-03-22 DIAGNOSIS — M797 Fibromyalgia: Secondary | ICD-10-CM

## 2015-03-22 DIAGNOSIS — L409 Psoriasis, unspecified: Secondary | ICD-10-CM | POA: Insufficient documentation

## 2015-03-22 DIAGNOSIS — D72829 Elevated white blood cell count, unspecified: Secondary | ICD-10-CM

## 2015-03-22 DIAGNOSIS — K449 Diaphragmatic hernia without obstruction or gangrene: Secondary | ICD-10-CM | POA: Insufficient documentation

## 2015-03-22 DIAGNOSIS — C679 Malignant neoplasm of bladder, unspecified: Secondary | ICD-10-CM | POA: Diagnosis not present

## 2015-03-22 DIAGNOSIS — K5190 Ulcerative colitis, unspecified, without complications: Secondary | ICD-10-CM | POA: Insufficient documentation

## 2015-03-22 DIAGNOSIS — K635 Polyp of colon: Secondary | ICD-10-CM | POA: Insufficient documentation

## 2015-03-22 DIAGNOSIS — N6019 Diffuse cystic mastopathy of unspecified breast: Secondary | ICD-10-CM | POA: Insufficient documentation

## 2015-03-22 DIAGNOSIS — C4490 Unspecified malignant neoplasm of skin, unspecified: Secondary | ICD-10-CM | POA: Insufficient documentation

## 2015-03-22 DIAGNOSIS — N189 Chronic kidney disease, unspecified: Secondary | ICD-10-CM

## 2015-03-22 DIAGNOSIS — Z79899 Other long term (current) drug therapy: Secondary | ICD-10-CM

## 2015-03-22 DIAGNOSIS — M6282 Rhabdomyolysis: Secondary | ICD-10-CM | POA: Insufficient documentation

## 2015-03-22 DIAGNOSIS — Z9981 Dependence on supplemental oxygen: Secondary | ICD-10-CM

## 2015-03-22 DIAGNOSIS — K279 Peptic ulcer, site unspecified, unspecified as acute or chronic, without hemorrhage or perforation: Secondary | ICD-10-CM | POA: Insufficient documentation

## 2015-03-22 DIAGNOSIS — F329 Major depressive disorder, single episode, unspecified: Secondary | ICD-10-CM | POA: Insufficient documentation

## 2015-03-22 DIAGNOSIS — N39498 Other specified urinary incontinence: Secondary | ICD-10-CM

## 2015-03-22 DIAGNOSIS — G43909 Migraine, unspecified, not intractable, without status migrainosus: Secondary | ICD-10-CM | POA: Insufficient documentation

## 2015-03-22 LAB — CBC WITH DIFFERENTIAL/PLATELET
BASOS ABS: 0 10*3/uL (ref 0–0.1)
Band Neutrophils: 10 %
Basophils Relative: 0 %
EOS ABS: 0 10*3/uL (ref 0–0.7)
EOS PCT: 0 %
HEMATOCRIT: 34 % — AB (ref 35.0–47.0)
HEMOGLOBIN: 11.4 g/dL — AB (ref 12.0–16.0)
Lymphocytes Relative: 19 %
Lymphs Abs: 2.3 10*3/uL (ref 1.0–3.6)
MCH: 31.2 pg (ref 26.0–34.0)
MCHC: 33.7 g/dL (ref 32.0–36.0)
MCV: 92.8 fL (ref 80.0–100.0)
METAMYELOCYTES PCT: 5 %
MONO ABS: 1.8 10*3/uL — AB (ref 0.2–0.9)
Monocytes Relative: 15 %
Myelocytes: 2 %
Neutro Abs: 7.9 10*3/uL — ABNORMAL HIGH (ref 1.4–6.5)
Neutrophils Relative %: 49 %
Platelets: 157 10*3/uL (ref 150–440)
RBC: 3.66 MIL/uL — AB (ref 3.80–5.20)
RDW: 26.2 % — ABNORMAL HIGH (ref 11.5–14.5)
WBC: 12 10*3/uL — ABNORMAL HIGH (ref 3.6–11.0)
nRBC: 1 /100 WBC — ABNORMAL HIGH

## 2015-03-22 LAB — BASIC METABOLIC PANEL
Anion gap: 5 (ref 5–15)
BUN: 16 mg/dL (ref 6–20)
CHLORIDE: 93 mmol/L — AB (ref 101–111)
CO2: 31 mmol/L (ref 22–32)
Calcium: 8 mg/dL — ABNORMAL LOW (ref 8.9–10.3)
Creatinine, Ser: 0.83 mg/dL (ref 0.44–1.00)
GFR calc non Af Amer: >60 mL/min (ref >60)
GLUCOSE: 138 mg/dL — AB (ref 65–99)
Potassium: 4.3 mmol/L (ref 3.5–5.1)
Sodium: 129 mmol/L — ABNORMAL LOW (ref 135–145)

## 2015-03-22 MED ORDER — HEPARIN SOD (PORK) LOCK FLUSH 100 UNIT/ML IV SOLN
500.0000 [IU] | Freq: Once | INTRAVENOUS | Status: DC | PRN
  Filled 2015-03-22: qty 5

## 2015-03-22 MED ORDER — HEPARIN SOD (PORK) LOCK FLUSH 100 UNIT/ML IV SOLN
500.0000 [IU] | Freq: Once | INTRAVENOUS | Status: AC
  Administered 2015-03-22: 500 [IU] via INTRAVENOUS

## 2015-03-22 MED ORDER — PROCHLORPERAZINE MALEATE 10 MG PO TABS
10.0000 mg | ORAL_TABLET | Freq: Once | ORAL | Status: AC
  Administered 2015-03-22: 10 mg via ORAL
  Filled 2015-03-22: qty 1

## 2015-03-22 MED ORDER — SODIUM CHLORIDE 0.9 % IJ SOLN
10.0000 mL | INTRAMUSCULAR | Status: DC | PRN
  Administered 2015-03-22: 10 mL via INTRAVENOUS
  Filled 2015-03-22: qty 10

## 2015-03-22 MED ORDER — SODIUM CHLORIDE 0.9 % IV SOLN
1450.0000 mg | Freq: Once | INTRAVENOUS | Status: AC
  Administered 2015-03-22: 1450 mg via INTRAVENOUS
  Filled 2015-03-22: qty 32.88

## 2015-03-22 MED ORDER — SODIUM CHLORIDE 0.9 % IV SOLN
175.6000 mg | Freq: Once | INTRAVENOUS | Status: AC
  Administered 2015-03-22: 180 mg via INTRAVENOUS
  Filled 2015-03-22: qty 18

## 2015-03-22 MED ORDER — SODIUM CHLORIDE 0.9 % IV SOLN
Freq: Once | INTRAVENOUS | Status: AC
  Administered 2015-03-22: 11:00:00 via INTRAVENOUS
  Filled 2015-03-22: qty 1000

## 2015-03-23 ENCOUNTER — Telehealth: Payer: Self-pay | Admitting: *Deleted

## 2015-03-23 NOTE — Telephone Encounter (Signed)
[facility] Appt. On 04/14/15 @ 10:00.  They will mail her the paperwork.

## 2015-03-24 ENCOUNTER — Inpatient Hospital Stay: Payer: Medicare Other

## 2015-03-24 VITALS — BP 108/73 | HR 90 | Temp 97.0°F | Resp 20

## 2015-03-24 DIAGNOSIS — C679 Malignant neoplasm of bladder, unspecified: Secondary | ICD-10-CM

## 2015-03-24 DIAGNOSIS — Z5111 Encounter for antineoplastic chemotherapy: Secondary | ICD-10-CM | POA: Diagnosis not present

## 2015-03-24 MED ORDER — TBO-FILGRASTIM 300 MCG/0.5ML ~~LOC~~ SOSY
300.0000 ug | PREFILLED_SYRINGE | Freq: Once | SUBCUTANEOUS | Status: AC
  Administered 2015-03-24: 300 ug via SUBCUTANEOUS
  Filled 2015-03-24: qty 0.5

## 2015-03-25 ENCOUNTER — Inpatient Hospital Stay: Payer: Medicare Other

## 2015-03-25 VITALS — BP 121/71 | HR 95 | Temp 97.3°F | Resp 20

## 2015-03-25 DIAGNOSIS — C679 Malignant neoplasm of bladder, unspecified: Secondary | ICD-10-CM

## 2015-03-25 DIAGNOSIS — Z5111 Encounter for antineoplastic chemotherapy: Secondary | ICD-10-CM | POA: Diagnosis not present

## 2015-03-25 MED ORDER — TBO-FILGRASTIM 300 MCG/0.5ML ~~LOC~~ SOSY
300.0000 ug | PREFILLED_SYRINGE | Freq: Once | SUBCUTANEOUS | Status: AC
  Administered 2015-03-25: 300 ug via SUBCUTANEOUS

## 2015-03-29 ENCOUNTER — Inpatient Hospital Stay: Payer: Medicare Other

## 2015-03-29 DIAGNOSIS — C679 Malignant neoplasm of bladder, unspecified: Secondary | ICD-10-CM

## 2015-03-29 DIAGNOSIS — Z5111 Encounter for antineoplastic chemotherapy: Secondary | ICD-10-CM | POA: Diagnosis not present

## 2015-03-29 LAB — CBC WITH DIFFERENTIAL/PLATELET
BASOS ABS: 0 10*3/uL (ref 0–0.1)
Basophils Relative: 1 %
Eosinophils Absolute: 0 10*3/uL (ref 0–0.7)
Eosinophils Relative: 0 %
HCT: 32.3 % — ABNORMAL LOW (ref 35.0–47.0)
HEMOGLOBIN: 11 g/dL — AB (ref 12.0–16.0)
LYMPHS PCT: 32 %
Lymphs Abs: 1.8 10*3/uL (ref 1.0–3.6)
MCH: 31.5 pg (ref 26.0–34.0)
MCHC: 33.9 g/dL (ref 32.0–36.0)
MCV: 92.7 fL (ref 80.0–100.0)
MONO ABS: 0.6 10*3/uL (ref 0.2–0.9)
Monocytes Relative: 11 %
NEUTROS PCT: 56 %
Neutro Abs: 3.2 10*3/uL (ref 1.4–6.5)
Platelets: 165 10*3/uL (ref 150–440)
RBC: 3.48 MIL/uL — ABNORMAL LOW (ref 3.80–5.20)
RDW: 27.8 % — AB (ref 11.5–14.5)
WBC: 5.6 10*3/uL (ref 3.6–11.0)

## 2015-03-30 ENCOUNTER — Telehealth: Payer: Self-pay | Admitting: Internal Medicine

## 2015-03-30 NOTE — Telephone Encounter (Signed)
He would like to talk to you about his wife's upcoming urology appointment. Please call: [phone].

## 2015-03-30 NOTE — Telephone Encounter (Signed)
Called and spoke to husband and he said that he was told that the appt for urology was 9/13. He knew that it was far off from the sch. That Dr. Ma Hillock had spoke about and I told him that is true.  The problem with Dr. Erlene Quan appt is that she is going on maternity leave sooner than was expected and so all the patients have been moved off her schedule and on other doctors and therefore the appt for the pt was pushed out.  He understands and his wife really wanted a woman doctor and so he called Dr. Rogers Blocker and spoke with them and they are fine doing cystoscopy and he arranged for it to be done 8/26 2 days after the Ct on 8/24.  Wants to make sure that is ok with pandit and if not need to call dr. Rogers Blocker and change it as well as calling either way to [facility] to cancel the appt.  I asked if pt was ok going to [facility] again and he said she was ok with it considering she could not get in [place] right now while she is on leave anyway.  I will check with pandit and cancel [facility]. And make sure pandit ok with 8/26 cystoscopy date. Husband fine with plan

## 2015-03-30 NOTE — Telephone Encounter (Signed)
Per pt husband have a lot of of question about why the appointments are scheduled the way they are. I tried to explain all the appointments she has, he Israel them  But have more question I couldn't answer. Please call.

## 2015-03-30 NOTE — Telephone Encounter (Signed)
Please see phone call note from this same date but matter has been taken care of.

## 2015-03-31 NOTE — Telephone Encounter (Signed)
Dr. Ma Hillock is ok with date of cystoscopy.  Nothing else is needed.

## 2015-03-31 NOTE — Telephone Encounter (Signed)
That is fine with me.

## 2015-04-01 ENCOUNTER — Other Ambulatory Visit: Payer: Self-pay | Admitting: Oncology
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
## 2015-04-04 ENCOUNTER — Other Ambulatory Visit: Payer: Self-pay | Admitting: Internal Medicine

## 2015-04-04 DIAGNOSIS — C679 Malignant neoplasm of bladder, unspecified: Secondary | ICD-10-CM

## 2015-04-04 MED ORDER — ONDANSETRON HCL 4 MG PO TABS: 4.0000 mg | ORAL_TABLET | ORAL | Status: DC | PRN

## 2015-04-05 ENCOUNTER — Inpatient Hospital Stay: Payer: Medicare Other

## 2015-04-05 ENCOUNTER — Ambulatory Visit
Admission: RE | Admit: 2015-04-05 | Discharge: 2015-04-05 | Disposition: A | Discharge status: Home / Self Care | Payer: Medicare Other | Source: Ambulatory Visit | Attending: Internal Medicine | Admitting: Internal Medicine

## 2015-04-05 DIAGNOSIS — C679 Malignant neoplasm of bladder, unspecified: Secondary | ICD-10-CM

## 2015-04-05 DIAGNOSIS — Z5111 Encounter for antineoplastic chemotherapy: Secondary | ICD-10-CM | POA: Diagnosis not present

## 2015-04-05 DIAGNOSIS — I714 Abdominal aortic aneurysm, without rupture: Secondary | ICD-10-CM | POA: Diagnosis not present

## 2015-04-05 LAB — CBC WITH DIFFERENTIAL/PLATELET
Basophils Absolute: 0 10*3/uL (ref 0–0.1)
Basophils Relative: 0 %
EOS PCT: 0 %
Eosinophils Absolute: 0 10*3/uL (ref 0–0.7)
HCT: 33.6 % — ABNORMAL LOW (ref 35.0–47.0)
Hemoglobin: 11.3 g/dL — ABNORMAL LOW (ref 12.0–16.0)
LYMPHS ABS: 1.6 10*3/uL (ref 1.0–3.6)
LYMPHS PCT: 25 %
MCH: 32.1 pg (ref 26.0–34.0)
MCHC: 33.7 g/dL (ref 32.0–36.0)
MCV: 95.4 fL (ref 80.0–100.0)
MONO ABS: 1.2 10*3/uL — AB (ref 0.2–0.9)
MONOS PCT: 18 %
Neutro Abs: 3.7 10*3/uL (ref 1.4–6.5)
Neutrophils Relative %: 57 %
PLATELETS: 282 10*3/uL (ref 150–440)
RBC: 3.52 MIL/uL — ABNORMAL LOW (ref 3.80–5.20)
RDW: 30 % — AB (ref 11.5–14.5)
WBC: 6.5 10*3/uL (ref 3.6–11.0)

## 2015-04-05 LAB — BASIC METABOLIC PANEL
Anion gap: 7 (ref 5–15)
BUN: 9 mg/dL (ref 6–20)
CHLORIDE: 91 mmol/L — AB (ref 101–111)
CO2: 32 mmol/L (ref 22–32)
Calcium: 8.6 mg/dL — ABNORMAL LOW (ref 8.9–10.3)
Creatinine, Ser: 0.66 mg/dL (ref 0.44–1.00)
GFR calc Af Amer: >60 mL/min (ref >60)
GLUCOSE: 90 mg/dL (ref 65–99)
POTASSIUM: 4.2 mmol/L (ref 3.5–5.1)
Sodium: 130 mmol/L — ABNORMAL LOW (ref 135–145)

## 2015-04-05 LAB — HEPATIC FUNCTION PANEL
ALT: 24 U/L (ref 14–54)
AST: 34 U/L (ref 15–41)
Albumin: 3.6 g/dL (ref 3.5–5.0)
Alkaline Phosphatase: 115 U/L (ref 38–126)
BILIRUBIN TOTAL: 0.4 mg/dL (ref 0.3–1.2)
Total Protein: 7.4 g/dL (ref 6.5–8.1)

## 2015-04-14 ENCOUNTER — Ambulatory Visit: Payer: Self-pay

## 2015-04-19 ENCOUNTER — Inpatient Hospital Stay: Payer: Medicare Other | Attending: Internal Medicine | Admitting: Internal Medicine

## 2015-04-19 ENCOUNTER — Encounter: Payer: Self-pay | Admitting: Radiation Oncology

## 2015-04-19 ENCOUNTER — Telehealth: Payer: Self-pay | Admitting: *Deleted

## 2015-04-19 ENCOUNTER — Ambulatory Visit
Admission: RE | Admit: 2015-04-19 | Discharge: 2015-04-19 | Disposition: A | Discharge status: Home / Self Care | Payer: Medicare Other | Source: Ambulatory Visit | Attending: Radiation Oncology | Admitting: Radiation Oncology

## 2015-04-19 VITALS — BP 143/96 | HR 93 | Temp 98.0°F | Ht 67.0 in | Wt 155.1 lb

## 2015-04-19 VITALS — Ht 67.0 in

## 2015-04-19 DIAGNOSIS — N189 Chronic kidney disease, unspecified: Secondary | ICD-10-CM | POA: Insufficient documentation

## 2015-04-19 DIAGNOSIS — G8929 Other chronic pain: Secondary | ICD-10-CM | POA: Diagnosis not present

## 2015-04-19 DIAGNOSIS — M1990 Unspecified osteoarthritis, unspecified site: Secondary | ICD-10-CM | POA: Diagnosis not present

## 2015-04-19 DIAGNOSIS — M549 Dorsalgia, unspecified: Secondary | ICD-10-CM | POA: Diagnosis not present

## 2015-04-19 DIAGNOSIS — N390 Urinary tract infection, site not specified: Secondary | ICD-10-CM | POA: Diagnosis not present

## 2015-04-19 DIAGNOSIS — D72820 Lymphocytosis (symptomatic): Secondary | ICD-10-CM | POA: Diagnosis not present

## 2015-04-19 DIAGNOSIS — C679 Malignant neoplasm of bladder, unspecified: Secondary | ICD-10-CM | POA: Insufficient documentation

## 2015-04-19 DIAGNOSIS — R0989 Other specified symptoms and signs involving the circulatory and respiratory systems: Secondary | ICD-10-CM | POA: Diagnosis not present

## 2015-04-19 DIAGNOSIS — Z9181 History of falling: Secondary | ICD-10-CM | POA: Diagnosis not present

## 2015-04-19 DIAGNOSIS — K219 Gastro-esophageal reflux disease without esophagitis: Secondary | ICD-10-CM | POA: Diagnosis not present

## 2015-04-19 DIAGNOSIS — Z5111 Encounter for antineoplastic chemotherapy: Secondary | ICD-10-CM | POA: Insufficient documentation

## 2015-04-19 DIAGNOSIS — R410 Disorientation, unspecified: Secondary | ICD-10-CM | POA: Insufficient documentation

## 2015-04-19 DIAGNOSIS — Z9981 Dependence on supplemental oxygen: Secondary | ICD-10-CM | POA: Diagnosis not present

## 2015-04-19 DIAGNOSIS — R531 Weakness: Secondary | ICD-10-CM | POA: Diagnosis not present

## 2015-04-19 DIAGNOSIS — R05 Cough: Secondary | ICD-10-CM | POA: Diagnosis not present

## 2015-04-19 DIAGNOSIS — F17210 Nicotine dependence, cigarettes, uncomplicated: Secondary | ICD-10-CM | POA: Insufficient documentation

## 2015-04-19 DIAGNOSIS — Z7952 Long term (current) use of systemic steroids: Secondary | ICD-10-CM | POA: Insufficient documentation

## 2015-04-19 DIAGNOSIS — R350 Frequency of micturition: Secondary | ICD-10-CM | POA: Insufficient documentation

## 2015-04-19 DIAGNOSIS — D751 Secondary polycythemia: Secondary | ICD-10-CM | POA: Diagnosis not present

## 2015-04-19 DIAGNOSIS — N39498 Other specified urinary incontinence: Secondary | ICD-10-CM | POA: Diagnosis not present

## 2015-04-19 DIAGNOSIS — Z510 Encounter for antineoplastic radiation therapy: Secondary | ICD-10-CM | POA: Insufficient documentation

## 2015-04-19 DIAGNOSIS — J449 Chronic obstructive pulmonary disease, unspecified: Secondary | ICD-10-CM | POA: Diagnosis not present

## 2015-04-19 DIAGNOSIS — R0600 Dyspnea, unspecified: Secondary | ICD-10-CM | POA: Insufficient documentation

## 2015-04-19 DIAGNOSIS — F418 Other specified anxiety disorders: Secondary | ICD-10-CM | POA: Insufficient documentation

## 2015-04-19 DIAGNOSIS — M797 Fibromyalgia: Secondary | ICD-10-CM | POA: Diagnosis not present

## 2015-04-19 DIAGNOSIS — Z79899 Other long term (current) drug therapy: Secondary | ICD-10-CM | POA: Diagnosis not present

## 2015-04-19 DIAGNOSIS — I129 Hypertensive chronic kidney disease with stage 1 through stage 4 chronic kidney disease, or unspecified chronic kidney disease: Secondary | ICD-10-CM | POA: Insufficient documentation

## 2015-04-19 DIAGNOSIS — D72829 Elevated white blood cell count, unspecified: Secondary | ICD-10-CM | POA: Insufficient documentation

## 2015-04-19 DIAGNOSIS — I10 Essential (primary) hypertension: Secondary | ICD-10-CM | POA: Diagnosis not present

## 2015-04-19 DIAGNOSIS — M542 Cervicalgia: Secondary | ICD-10-CM | POA: Insufficient documentation

## 2015-04-19 MED ORDER — VARENICLINE TARTRATE 1 MG PO TABS: ORAL_TABLET | ORAL | Status: DC

## 2015-04-19 MED ORDER — VARENICLINE TARTRATE 0.5 MG PO TABS: ORAL_TABLET | ORAL | Status: DC

## 2015-04-19 NOTE — Progress Notes (Signed)
Radiation Oncology Follow up Note  Name: Rebecca Holland   Date:   04/19/2015 MRN:  [id number] DOB: [birth year]    This 64 y.o. female presents to the clinic today for initiation of radiation for muscle invasion high-grade transitional cell carcinoma the bladder.  REFERRING PROVIDER: Idelle Crouch, MD  HPI: Patient is a 64 year old female originally consult back in May 2016 when she presented with a least stage II (T2 N0 M0) transitional cell carcinoma high-grade of the bladder with positive urine cytology status post transurethral resection. Based on her multiple comorbidities including nasal oxygen dependence, polycythemia vera and other multiple medical comorbidities we opted for bladder conservation. She is undergone gemcitabine and carboplatinum. She recently had repeat cystoscopy although I have not yet been able to review the formal note it seems there was no evidence of disease at that time. CT scan also was performed showing asymmetry of the bladder wall with thickening of the right bladder wall with no evidence of metastatic disease or pelvic lymphadenopathy. She is doing fairly well does have urinary incontinence. No diarrhea. She seen today for commencement of the radiation therapy portion of her treatment plan.  COMPLICATIONS OF TREATMENT: none  FOLLOW UP COMPLIANCE: keeps appointments   PHYSICAL EXAM:  BP 143/96 mmHg  Pulse 93  Temp(Src) 98 F (36.7 C)  Ht 5\' 7"  (1.702 m)  Wt 155 lb 1.5 oz (70.35 kg)  BMI 24.29 kg/m2 Well-developed well-nourished patient in NAD. HEENT reveals PERLA, EOMI, discs not visualized.  Oral cavity is clear. No oral mucosal lesions are identified. Neck is clear without evidence of cervical or supraclavicular adenopathy. Lungs are clear to A&P. Cardiac examination is essentially unremarkable with regular rate and rhythm without murmur rub or thrill. Abdomen is benign with no organomegaly or masses noted. Motor sensory and DTR levels are equal and  symmetric in the upper and lower extremities. Cranial nerves II through XII are grossly intact. Proprioception is intact. No peripheral adenopathy or edema is identified. No motor or sensory levels are noted. Crude visual fields are within normal range.   RADIOLOGY RESULTS: CT scan is reviewed  PLAN: At the present time I like to go ahead with radiation therapy to her whole pelvis with concurrent weekly cisplatin-based chemotherapy. Would plan on delivering 4500 cGy to her whole pelvis including her bladder then boosting the right bladder wall another 2000 cGy for boost. Risks and benefits of treatment including increased lower urinary tract symptoms, fatigue, alteration of blood counts, skin reaction all were described in detail to the patient and her daughter. They both seem to comprehend my treatment plan well. I have set up and ordered CT simulation early next week. We'll coordinate her weekly platinum chemotherapy with medical oncology.  I would like to take this opportunity for allowing me to participate in the care of your patient.Armstead Peaks., MD

## 2015-04-19 NOTE — Telephone Encounter (Signed)
Pt in office today and asked for chantix to help her stop smoking.  [facility] and spoke to [place]. He states she can be on starter pack for first week  and then follow with rx for 1 mg bid for total of 11 weeks.  Pt. Was told while she was in office that it will be called in.

## 2015-04-24 ENCOUNTER — Ambulatory Visit
Admission: RE | Admit: 2015-04-24 | Discharge: 2015-04-24 | Disposition: A | Discharge status: Home / Self Care | Payer: Medicare Other | Source: Ambulatory Visit | Attending: Radiation Oncology | Admitting: Radiation Oncology

## 2015-04-24 DIAGNOSIS — Z510 Encounter for antineoplastic radiation therapy: Secondary | ICD-10-CM | POA: Diagnosis present

## 2015-04-24 DIAGNOSIS — C679 Malignant neoplasm of bladder, unspecified: Secondary | ICD-10-CM | POA: Diagnosis not present

## 2015-04-25 ENCOUNTER — Ambulatory Visit: Payer: Self-pay

## 2015-04-25 DIAGNOSIS — Z510 Encounter for antineoplastic radiation therapy: Secondary | ICD-10-CM | POA: Diagnosis not present

## 2015-04-27 ENCOUNTER — Inpatient Hospital Stay: Payer: Medicare Other

## 2015-04-27 ENCOUNTER — Telehealth: Payer: Self-pay | Admitting: Internal Medicine

## 2015-04-27 ENCOUNTER — Telehealth: Payer: Self-pay

## 2015-04-27 ENCOUNTER — Inpatient Hospital Stay (HOSPITAL_BASED_OUTPATIENT_CLINIC_OR_DEPARTMENT_OTHER): Payer: Medicare Other | Admitting: Family Medicine

## 2015-04-27 ENCOUNTER — Other Ambulatory Visit: Payer: Self-pay | Admitting: *Deleted

## 2015-04-27 VITALS — BP 125/73 | HR 90 | Temp 98.0°F

## 2015-04-27 DIAGNOSIS — M797 Fibromyalgia: Secondary | ICD-10-CM

## 2015-04-27 DIAGNOSIS — R531 Weakness: Secondary | ICD-10-CM

## 2015-04-27 DIAGNOSIS — Z5111 Encounter for antineoplastic chemotherapy: Secondary | ICD-10-CM | POA: Diagnosis not present

## 2015-04-27 DIAGNOSIS — C679 Malignant neoplasm of bladder, unspecified: Secondary | ICD-10-CM

## 2015-04-27 DIAGNOSIS — R410 Disorientation, unspecified: Secondary | ICD-10-CM

## 2015-04-27 DIAGNOSIS — J449 Chronic obstructive pulmonary disease, unspecified: Secondary | ICD-10-CM

## 2015-04-27 DIAGNOSIS — R0989 Other specified symptoms and signs involving the circulatory and respiratory systems: Secondary | ICD-10-CM

## 2015-04-27 DIAGNOSIS — N39498 Other specified urinary incontinence: Secondary | ICD-10-CM

## 2015-04-27 DIAGNOSIS — I10 Essential (primary) hypertension: Secondary | ICD-10-CM

## 2015-04-27 DIAGNOSIS — F17210 Nicotine dependence, cigarettes, uncomplicated: Secondary | ICD-10-CM

## 2015-04-27 DIAGNOSIS — D751 Secondary polycythemia: Secondary | ICD-10-CM

## 2015-04-27 DIAGNOSIS — Z9181 History of falling: Secondary | ICD-10-CM

## 2015-04-27 DIAGNOSIS — Z9981 Dependence on supplemental oxygen: Secondary | ICD-10-CM

## 2015-04-27 LAB — CBC WITH DIFFERENTIAL/PLATELET
BASOS PCT: 1 %
Basophils Absolute: 0.1 10*3/uL (ref 0–0.1)
EOS ABS: 0.1 10*3/uL (ref 0–0.7)
EOS PCT: 1 %
HCT: 37.8 % (ref 35.0–47.0)
HEMOGLOBIN: 12.6 g/dL (ref 12.0–16.0)
Lymphocytes Relative: 22 %
Lymphs Abs: 2.4 10*3/uL (ref 1.0–3.6)
MCH: 32.8 pg (ref 26.0–34.0)
MCHC: 33.3 g/dL (ref 32.0–36.0)
MCV: 98.6 fL (ref 80.0–100.0)
MONOS PCT: 9 %
Monocytes Absolute: 0.9 10*3/uL (ref 0.2–0.9)
NEUTROS PCT: 67 %
Neutro Abs: 7.2 10*3/uL — ABNORMAL HIGH (ref 1.4–6.5)
PLATELETS: 197 10*3/uL (ref 150–440)
RBC: 3.84 MIL/uL (ref 3.80–5.20)
RDW: 22.3 % — ABNORMAL HIGH (ref 11.5–14.5)
WBC: 10.7 10*3/uL (ref 3.6–11.0)

## 2015-04-27 LAB — URINALYSIS COMPLETE WITH MICROSCOPIC (ARMC ONLY)
BILIRUBIN URINE: NEGATIVE
GLUCOSE, UA: NEGATIVE mg/dL
HGB URINE DIPSTICK: NEGATIVE
Ketones, ur: NEGATIVE mg/dL
Nitrite: NEGATIVE
Protein, ur: 100 mg/dL — AB
RBC / HPF: NONE SEEN RBC/hpf (ref 0–5)
Specific Gravity, Urine: 1.019 (ref 1.005–1.030)
pH: 6 (ref 5.0–8.0)

## 2015-04-27 LAB — SAMPLE TO BLOOD BANK

## 2015-04-27 LAB — BASIC METABOLIC PANEL
Anion gap: 5 (ref 5–15)
BUN: 14 mg/dL (ref 6–20)
CALCIUM: 8.7 mg/dL — AB (ref 8.9–10.3)
CO2: 37 mmol/L — ABNORMAL HIGH (ref 22–32)
CREATININE: 0.68 mg/dL (ref 0.44–1.00)
Chloride: 91 mmol/L — ABNORMAL LOW (ref 101–111)
GFR calc non Af Amer: >60 mL/min (ref >60)
Glucose, Bld: 106 mg/dL — ABNORMAL HIGH (ref 65–99)
Potassium: 4.8 mmol/L (ref 3.5–5.1)
SODIUM: 133 mmol/L — AB (ref 135–145)

## 2015-04-27 MED ORDER — CIPROFLOXACIN HCL 500 MG PO TABS: 500.0000 mg | ORAL_TABLET | Freq: Two times a day (BID) | ORAL | Status: DC

## 2015-04-27 NOTE — Telephone Encounter (Signed)
Per [facility], pt has SOB, wheezing, congestion and coughing. Spoke with Dr. Alva Garnet. Informed [name] of an appt time for Friday am with DR and informed for pt to go to ER if gets worse before appt per Dr. Alva Garnet. [name] states she will inform pt of instrcutions and appt time. Nothing further needed.

## 2015-04-27 NOTE — Progress Notes (Signed)
Patient here in acute pain and discomfort secondary to fall this am. Family states she has been declining for the past three days.
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
## 2015-04-27 NOTE — Telephone Encounter (Signed)
Patient's condition has deteriorated rapidly over the past two days. She is so weak she can barely walk. She fell this morning. She can hardly talk she is so weak. They are hoping she can be seen today or at least have blood tests done. Please advise: [phone].

## 2015-04-27 NOTE — Telephone Encounter (Signed)
I called this am and spoke to daughter Rebecca Holland in which she states that her Mom has taken a turn for the worst in 2 days. She is so weak she needs asst to get up.  She also fell again. She says at times she is confused and she is acting just like when her labs had dropped and wanted to know if she could be seen.  I checked with pandit and said that the confusion could come from co2 retentions and if that is the case she needs to be seen with PCP or urgent care.  If it is her hgb that dropped then we can take care of that.  Ask if leslie can see since he is [place].  There are no chairs open for blood today but could get unit tom.  I called [name] to ask her and she was fine with labs cbc, met b, x tube and see leslie 11:30. Got Mickel Baas to put her on sch. And i called daughter Rebecca Holland and told her that if she needs blood we have no room until tom.  If it is unrelated to labs then she would need to see PCP or urgent care and she was agreeable to this.  I also called back and told [name] above. Labs entered

## 2015-04-27 NOTE — Telephone Encounter (Signed)
Nurse with Cancer center called, pt is currently there, has fatigue, cough, increased weakness, 88% oxygen on 4 Liters. Please call.

## 2015-04-28 ENCOUNTER — Encounter: Payer: Self-pay | Admitting: Internal Medicine

## 2015-04-28 ENCOUNTER — Ambulatory Visit: Payer: Medicare Other

## 2015-04-28 ENCOUNTER — Ambulatory Visit (INDEPENDENT_AMBULATORY_CARE_PROVIDER_SITE_OTHER): Payer: Medicare Other | Admitting: Internal Medicine

## 2015-04-28 VITALS — BP 138/68 | HR 100 | Ht 67.0 in | Wt 152.0 lb

## 2015-04-28 DIAGNOSIS — Z23 Encounter for immunization: Secondary | ICD-10-CM | POA: Diagnosis not present

## 2015-04-28 DIAGNOSIS — R05 Cough: Secondary | ICD-10-CM

## 2015-04-28 DIAGNOSIS — Z720 Tobacco use: Secondary | ICD-10-CM

## 2015-04-28 DIAGNOSIS — J449 Chronic obstructive pulmonary disease, unspecified: Secondary | ICD-10-CM | POA: Diagnosis not present

## 2015-04-28 DIAGNOSIS — R059 Cough, unspecified: Secondary | ICD-10-CM

## 2015-04-28 LAB — URINE CULTURE

## 2015-04-28 MED ORDER — PREDNISONE 10 MG PO TABS: ORAL_TABLET | ORAL | Status: DC

## 2015-04-28 MED ORDER — UMECLIDINIUM-VILANTEROL 62.5-25 MCG/INH IN AEPB: 1.0000 | INHALATION_SPRAY | Freq: Every day | RESPIRATORY_TRACT | Status: AC

## 2015-04-28 NOTE — Addendum Note (Signed)
Addended by: Oscar La R on: 04/28/2015 12:20 PM   Modules accepted: Orders

## 2015-04-28 NOTE — Patient Instructions (Addendum)
--  Use Anoro inhaler once daily. Stop spiriva and symbicort.  --Prednisone 10 mg tabs, #21, no refills. Take 5 tabs first day, then 4-3-2-1-stop.  --flu shot when available.   --Set a stop smoking quit date.

## 2015-04-28 NOTE — Progress Notes (Signed)
* [facility]     Assessment and Plan:  -AECOPD with acute bronchitis.  -She has forgotten to use spiriva over the past 2 days, and is not on symbicort because of cost. Will switch to Anorro once daily which will hopefully be more affordable than the symbicort/spiriva combo and easier for her to remember.  -Will give her a course of prednisone. She has already completed a course of levaquin, suspect that this is viral.  -Follow up with Dr. Stevenson Clinch at next regularly scheduled appt.    -Nicotine Abuse.  -Recommend that she set a quite date, continue chantix. Greater than 5 min spent in counseling.   -Acute on Chronic hypoxic respiratory failure.     Date: 04/28/2015  MRN# [id number] Rebecca Holland 07-30-1951   Rebecca Holland is a 64 y.o. old female seen in follow up for chief complaint of  Chief Complaint  Patient presents with  . Acute Visit    prod cough; given abx yesterday for UTI; SOB; fatigue;congestion     HPI:  Patient is a 64 year old female smoker with a past medical history of COPD, bladder cancer on chemotherapy. Her COPD is managed with 3 L of continuous oxygen, Spiriva, ProAir. She normally sees Dr. Stevenson Clinch in the office as her pulmonary doctor, she comes in today for sick visit. The patient has a history of bladder cancer, she was seen in the cancer center yesterday and noted to be winded and hypoxic.  She completed the first 12 weeks of chemo about 3 weeks ago.  She was to start on radiation 3 weeks ago but this was held off due to respiratory infection and she was treated with levaquin 500 mg for 10 days. She did not think that she had improved with that.  She notes that 4 days ago she had in increase in sputum and cough, and her breathing is worse.  She is smoking about 4 cigs and is also on chantix for 5 days.   She normally takes spiriva once daily, she stopped taking symbicort 2 weeks worth of samples, but did not take the  prescription due to cost. She has forgotten to use the spiriva for the past 2 days.     No flowsheet data found.  Pulmonary Functions Testing Results:  No results found for: FEV1, FVC, FEV1FVC, TLC, DLCO   Medication:   Current Outpatient Rx  Name  Route  Sig  Dispense  Refill  . albuterol (PROVENTIL HFA;VENTOLIN HFA) 108 (90 BASE) MCG/ACT inhaler   Inhalation   Inhale 2 puffs into the lungs every 6 (six) hours as needed for wheezing or shortness of breath.         . Alum & Mag Hydroxide-Simeth (MAGIC MOUTHWASH) SOLN   Oral   Take 5 mLs by mouth 4 (four) times daily as needed for mouth pain.   480 mL   1   . benzonatate (TESSALON) 100 MG capsule   Oral   Take 1 capsule (100 mg total) by mouth 3 (three) times daily.   90 capsule   1   . Calcium Carbonate-Vit D-Min (CALCIUM 1200 PO)   Oral   Take 1 tablet by mouth daily.         . ciprofloxacin (CIPRO) 500 MG tablet   Oral   Take 1 tablet (500 mg total) by mouth 2 (two) times daily.   20 tablet   0   . cyclobenzaprine (FLEXERIL) 10 MG tablet   Oral  Take 10 mg by mouth 3 (three) times daily as needed for muscle spasms.         . diazepam (VALIUM) 5 MG tablet   Oral   Take 5 mg by mouth every 6 (six) hours as needed for anxiety.         . docusate sodium (COLACE) 50 MG capsule   Oral   Take 50 mg by mouth 2 (two) times daily.         . DULoxetine (CYMBALTA) 20 MG capsule               . DULoxetine (CYMBALTA) 30 MG capsule               . fexofenadine (ALLEGRA) 180 MG tablet   Oral   Take 180 mg by mouth daily.         . Fluticasone-Salmeterol (ADVAIR DISKUS) 500-50 MCG/DOSE AEPB   Inhalation   Inhale 1 puff into the lungs 2 (two) times daily. Rinse and gargle after each use.   60 each   3   . furosemide (LASIX) 20 MG tablet   Oral   Take 20 mg by mouth daily.      0   . levofloxacin (LEVAQUIN) 500 MG tablet   Oral   Take 1 tablet (500 mg total) by mouth daily.   7 tablet    0   . lidocaine-prilocaine (EMLA) cream   Topical   Apply 1 application topically as needed (for application over port site 1 hour before treatment).         . Misc Natural Products (COLON CARE PO)   Oral   Take 1 capsule by mouth daily.         Marland Kitchen nystatin (MYCOSTATIN) 100000 UNIT/ML suspension               . ondansetron (ZOFRAN) 4 MG tablet   Oral   Take 1 tablet (4 mg total) by mouth every 4 (four) hours as needed for nausea or vomiting.   60 tablet   2   . oxycodone (ROXICODONE) 30 MG immediate release tablet   Oral   Take 30 mg by mouth every 4 (four) hours as needed for pain.          . OXYGEN   Inhalation   Inhale into the lungs.         . Polyethylene Glycol 3350 (MIRALAX PO)   Oral   Take 1 packet by mouth 2 (two) times daily.         . Probiotic Product (PROBIOTIC DAILY PO)   Oral   Take 1 capsule by mouth.         . promethazine (PHENERGAN) 25 MG tablet   Oral   Take 1 tablet (25 mg total) by mouth every 6 (six) hours as needed for nausea or vomiting.   60 tablet   1   . Psyllium (METAMUCIL) WAFR   Oral   Take 1 Wafer by mouth every morning.         . ranitidine (ZANTAC) 150 MG capsule   Oral   Take 150 mg by mouth every morning.          . ranitidine (ZANTAC) 300 MG tablet   Oral   Take 300 mg by mouth at bedtime.         Marland Kitchen tiotropium (SPIRIVA) 18 MCG inhalation capsule   Inhalation   Place 18 mcg into inhaler and inhale daily.         Marland Kitchen  varenicline (CHANTIX CONTINUING MONTH PAK) 1 MG tablet      Starting on week 2 with new dose of chantix 1 mg bid   60 tablet   2   . varenicline (CHANTIX) 0.5 MG tablet      Daily for day 1-3, then bid for day 4-7   11 tablet   0   . Vitamin D, Cholecalciferol, 400 UNITS CAPS   Oral   Take 2 tablets by mouth daily.         . zoledronic acid (RECLAST) 5 MG/100ML SOLN injection   Intravenous   Inject 5 mg into the vein once.            Allergies:  Contrast media;  Iohexol; Betadine; Nsaids; Penicillins; Prednisone; Shellfish allergy; Tape; Latex; and Sulfa antibiotics  Review of Systems: Gen:  Denies  fever, sweats. HEENT: Denies blurred vision. Cvc:  No dizziness, chest pain or heaviness Resp:   Denies cough or sputum porduction. Gi: Denies swallowing difficulty, stomach pain. constipation, bowel incontinence Gu:  Denies bladder incontinence, burning urine Ext:   No Joint pain, stiffness. Skin: No skin rash, easy bruising. Endoc:  No polyuria, polydipsia. Psych: No depression, insomnia. Other:  All other systems were reviewed and found to be negative other than what is mentioned in the HPI.   Physical Examination:   VS: BP 138/68 mmHg  Pulse 100  Ht 5\' 7"  (1.702 m)  Wt 68.947 kg (152 lb)  BMI 23.80 kg/m2  SpO2 90%  General Appearance: No distress  Neuro:without focal findings,  speech normal,  HEENT: PERRLA, EOM intact. Pulmonary: normal breath sounds, No wheezing.   CardiovascularNormal S1,S2.  No m/r/g.   Abdomen: Benign, Soft, non-tender. Renal:  No costovertebral tenderness  GU:  Not performed at this time. Endoc: No evident thyromegaly, no signs of acromegaly. Skin:   warm, no rash. Extremities: normal, no cyanosis, clubbing.   LABORATORY PANEL:   CBC  Recent Labs Lab 04/27/15 1116  WBC 10.7  HGB 12.6  HCT 37.8  PLT 197   ------------------------------------------------------------------------------------------------------------------  Chemistries   Recent Labs Lab 04/27/15 1116  NA 133*  K 4.8  CL 91*  CO2 37*  GLUCOSE 106*  BUN 14  CREATININE 0.68  CALCIUM 8.7*   ------------------------------------------------------------------------------------------------------------------  Cardiac Enzymes No results for input(s): TROPONINI in the last 168 hours. ------------------------------------------------------------  RADIOLOGY:   No results found for this or any previous visit. Results for orders placed  during the hospital encounter of 03/08/15  DG Chest 2 View   Narrative CLINICAL DATA:  Fall over the weekend. Pain with deep inspiration and cough. Bruising to left side of ribs.  EXAM: CHEST  2 VIEW  COMPARISON:  01/05/2015  FINDINGS: Mild hyperinflation of the lungs. Left Port-A-Cath remains in place with the tip in the SVC, unchanged. Heart is normal size. Tortuosity of the thoracic aorta. Chronic interstitial prominence throughout the lungs. Increasing bibasilar linear densities, likely atelectasis. No visible rib fractures. No effusions or pneumothorax.  IMPRESSION: COPD/chronic interstitial prominence.  Increasing bibasilar atelectasis.  No visible rib fracture or pneumothorax.   Electronically Signed   By: Rolm Baptise M.D.   On: 03/08/2015 14:13    ------------------------------------------------------------------------------------------------------------------  Thank  you for allowing [facility] to assist in the care of your patient. Our recommendations are noted above.  Please contact Rebecca if we can be of further service.   Marda Stalker, MD.  [facility] Office Number: [phone]  Patricia Pesa, M.D.  Vilinda Boehringer, M.D.  Merton Border, M.D

## 2015-05-01 ENCOUNTER — Inpatient Hospital Stay: Payer: Medicare Other | Attending: Internal Medicine

## 2015-05-01 ENCOUNTER — Ambulatory Visit
Admission: RE | Admit: 2015-05-01 | Discharge: 2015-05-01 | Disposition: A | Discharge status: Home / Self Care | Payer: Medicare Other | Source: Ambulatory Visit | Attending: Radiation Oncology | Admitting: Radiation Oncology

## 2015-05-01 ENCOUNTER — Other Ambulatory Visit: Payer: Self-pay | Admitting: Family Medicine

## 2015-05-01 DIAGNOSIS — Z510 Encounter for antineoplastic radiation therapy: Secondary | ICD-10-CM | POA: Diagnosis not present

## 2015-05-02 ENCOUNTER — Inpatient Hospital Stay: Payer: Medicare Other

## 2015-05-02 ENCOUNTER — Ambulatory Visit
Admission: RE | Admit: 2015-05-02 | Discharge: 2015-05-02 | Disposition: A | Discharge status: Home / Self Care | Payer: Medicare Other | Source: Ambulatory Visit | Attending: Radiation Oncology | Admitting: Radiation Oncology

## 2015-05-02 ENCOUNTER — Encounter: Payer: Self-pay | Admitting: Family Medicine

## 2015-05-02 ENCOUNTER — Ambulatory Visit: Payer: Medicare Other | Admitting: Internal Medicine

## 2015-05-02 ENCOUNTER — Encounter: Payer: Self-pay | Admitting: Oncology

## 2015-05-02 ENCOUNTER — Inpatient Hospital Stay (HOSPITAL_BASED_OUTPATIENT_CLINIC_OR_DEPARTMENT_OTHER): Payer: Medicare Other | Admitting: Family Medicine

## 2015-05-02 VITALS — BP 106/68 | HR 101 | Temp 97.9°F | Resp 20 | Ht 67.0 in | Wt 154.1 lb

## 2015-05-02 DIAGNOSIS — N39498 Other specified urinary incontinence: Secondary | ICD-10-CM | POA: Diagnosis not present

## 2015-05-02 DIAGNOSIS — Z79899 Other long term (current) drug therapy: Secondary | ICD-10-CM

## 2015-05-02 DIAGNOSIS — C679 Malignant neoplasm of bladder, unspecified: Secondary | ICD-10-CM

## 2015-05-02 DIAGNOSIS — N390 Urinary tract infection, site not specified: Secondary | ICD-10-CM

## 2015-05-02 DIAGNOSIS — Z510 Encounter for antineoplastic radiation therapy: Secondary | ICD-10-CM | POA: Diagnosis not present

## 2015-05-02 DIAGNOSIS — D751 Secondary polycythemia: Secondary | ICD-10-CM

## 2015-05-02 DIAGNOSIS — J449 Chronic obstructive pulmonary disease, unspecified: Secondary | ICD-10-CM

## 2015-05-02 DIAGNOSIS — G8929 Other chronic pain: Secondary | ICD-10-CM | POA: Diagnosis not present

## 2015-05-02 DIAGNOSIS — Z7952 Long term (current) use of systemic steroids: Secondary | ICD-10-CM

## 2015-05-02 DIAGNOSIS — Z5111 Encounter for antineoplastic chemotherapy: Secondary | ICD-10-CM | POA: Diagnosis not present

## 2015-05-02 DIAGNOSIS — Z9981 Dependence on supplemental oxygen: Secondary | ICD-10-CM

## 2015-05-02 DIAGNOSIS — M542 Cervicalgia: Secondary | ICD-10-CM

## 2015-05-02 DIAGNOSIS — F17210 Nicotine dependence, cigarettes, uncomplicated: Secondary | ICD-10-CM

## 2015-05-02 DIAGNOSIS — M549 Dorsalgia, unspecified: Secondary | ICD-10-CM

## 2015-05-02 LAB — CREATININE, SERUM
CREATININE: 0.78 mg/dL (ref 0.44–1.00)
GFR calc Af Amer: >60 mL/min (ref >60)

## 2015-05-02 LAB — CBC
HEMATOCRIT: 38.9 % (ref 35.0–47.0)
Hemoglobin: 13.1 g/dL (ref 12.0–16.0)
MCH: 33.3 pg (ref 26.0–34.0)
MCHC: 33.7 g/dL (ref 32.0–36.0)
MCV: 98.9 fL (ref 80.0–100.0)
PLATELETS: 226 10*3/uL (ref 150–440)
RBC: 3.94 MIL/uL (ref 3.80–5.20)
RDW: 22 % — AB (ref 11.5–14.5)
WBC: 10.4 10*3/uL (ref 3.6–11.0)

## 2015-05-02 MED ORDER — PROMETHAZINE HCL 25 MG PO TABS: 25.0000 mg | ORAL_TABLET | Freq: Four times a day (QID) | ORAL | Status: DC | PRN

## 2015-05-02 MED ORDER — SODIUM CHLORIDE 0.9 % IV SOLN
Freq: Once | INTRAVENOUS | Status: AC
  Administered 2015-05-02: 11:00:00 via INTRAVENOUS
  Filled 2015-05-02: qty 4

## 2015-05-02 MED ORDER — SODIUM CHLORIDE 0.9 % IV SOLN
173.8000 mg | Freq: Once | INTRAVENOUS | Status: AC
  Administered 2015-05-02: 170 mg via INTRAVENOUS
  Filled 2015-05-02: qty 17

## 2015-05-02 MED ORDER — SODIUM CHLORIDE 0.9 % IV SOLN
Freq: Once | INTRAVENOUS | Status: AC
  Administered 2015-05-02: 11:00:00 via INTRAVENOUS
  Filled 2015-05-02: qty 1000

## 2015-05-02 MED ORDER — KETOROLAC TROMETHAMINE 15 MG/ML IJ SOLN: 15.0000 mg | Freq: Once | INTRAMUSCULAR | Status: DC

## 2015-05-02 MED ORDER — HEPARIN SOD (PORK) LOCK FLUSH 100 UNIT/ML IV SOLN
500.0000 [IU] | Freq: Once | INTRAVENOUS | Status: AC | PRN
  Administered 2015-05-02: 500 [IU]
  Filled 2015-05-02: qty 5

## 2015-05-02 NOTE — Progress Notes (Signed)
Feels she is better from urinary tract infections but lots of pain in lower back and she took her pain med at 8:50 before come
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
## 2015-05-03 ENCOUNTER — Ambulatory Visit
Admission: RE | Admit: 2015-05-03 | Discharge: 2015-05-03 | Disposition: A | Discharge status: Home / Self Care | Payer: Medicare Other | Source: Ambulatory Visit | Attending: Radiation Oncology | Admitting: Radiation Oncology

## 2015-05-03 ENCOUNTER — Inpatient Hospital Stay: Payer: Medicare Other

## 2015-05-03 DIAGNOSIS — Z510 Encounter for antineoplastic radiation therapy: Secondary | ICD-10-CM | POA: Diagnosis not present

## 2015-05-04 ENCOUNTER — Inpatient Hospital Stay: Payer: Medicare Other

## 2015-05-04 ENCOUNTER — Ambulatory Visit
Admission: RE | Admit: 2015-05-04 | Discharge: 2015-05-04 | Disposition: A | Discharge status: Home / Self Care | Payer: Medicare Other | Source: Ambulatory Visit | Attending: Radiation Oncology | Admitting: Radiation Oncology

## 2015-05-04 DIAGNOSIS — Z510 Encounter for antineoplastic radiation therapy: Secondary | ICD-10-CM | POA: Diagnosis not present
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
## 2015-05-05 ENCOUNTER — Inpatient Hospital Stay: Payer: Medicare Other

## 2015-05-05 ENCOUNTER — Ambulatory Visit
Admission: RE | Admit: 2015-05-05 | Discharge: 2015-05-05 | Disposition: A | Discharge status: Home / Self Care | Payer: Medicare Other | Source: Ambulatory Visit | Attending: Radiation Oncology | Admitting: Radiation Oncology

## 2015-05-05 DIAGNOSIS — Z510 Encounter for antineoplastic radiation therapy: Secondary | ICD-10-CM | POA: Diagnosis not present

## 2015-05-08 ENCOUNTER — Ambulatory Visit
Admission: RE | Admit: 2015-05-08 | Discharge: 2015-05-08 | Disposition: A | Discharge status: Home / Self Care | Payer: Medicare Other | Source: Ambulatory Visit | Attending: Radiation Oncology | Admitting: Radiation Oncology

## 2015-05-08 ENCOUNTER — Inpatient Hospital Stay: Payer: Medicare Other

## 2015-05-08 DIAGNOSIS — Z510 Encounter for antineoplastic radiation therapy: Secondary | ICD-10-CM | POA: Diagnosis not present

## 2015-05-09 ENCOUNTER — Inpatient Hospital Stay: Payer: Medicare Other

## 2015-05-09 ENCOUNTER — Ambulatory Visit
Admission: RE | Admit: 2015-05-09 | Discharge: 2015-05-09 | Disposition: A | Discharge status: Home / Self Care | Payer: Medicare Other | Source: Ambulatory Visit | Attending: Radiation Oncology | Admitting: Radiation Oncology

## 2015-05-09 ENCOUNTER — Ambulatory Visit: Payer: Medicare Other

## 2015-05-09 ENCOUNTER — Inpatient Hospital Stay (HOSPITAL_BASED_OUTPATIENT_CLINIC_OR_DEPARTMENT_OTHER): Payer: Medicare Other | Admitting: Internal Medicine

## 2015-05-09 VITALS — BP 97/62 | HR 103 | Temp 98.4°F | Resp 18 | Ht 67.0 in | Wt 155.0 lb

## 2015-05-09 DIAGNOSIS — R0600 Dyspnea, unspecified: Secondary | ICD-10-CM

## 2015-05-09 DIAGNOSIS — D72829 Elevated white blood cell count, unspecified: Secondary | ICD-10-CM

## 2015-05-09 DIAGNOSIS — J449 Chronic obstructive pulmonary disease, unspecified: Secondary | ICD-10-CM

## 2015-05-09 DIAGNOSIS — C679 Malignant neoplasm of bladder, unspecified: Secondary | ICD-10-CM

## 2015-05-09 DIAGNOSIS — N189 Chronic kidney disease, unspecified: Secondary | ICD-10-CM

## 2015-05-09 DIAGNOSIS — D751 Secondary polycythemia: Secondary | ICD-10-CM

## 2015-05-09 DIAGNOSIS — N39498 Other specified urinary incontinence: Secondary | ICD-10-CM

## 2015-05-09 DIAGNOSIS — R05 Cough: Secondary | ICD-10-CM

## 2015-05-09 DIAGNOSIS — R531 Weakness: Secondary | ICD-10-CM

## 2015-05-09 DIAGNOSIS — R350 Frequency of micturition: Secondary | ICD-10-CM | POA: Diagnosis not present

## 2015-05-09 DIAGNOSIS — D72820 Lymphocytosis (symptomatic): Secondary | ICD-10-CM

## 2015-05-09 DIAGNOSIS — I129 Hypertensive chronic kidney disease with stage 1 through stage 4 chronic kidney disease, or unspecified chronic kidney disease: Secondary | ICD-10-CM

## 2015-05-09 DIAGNOSIS — F17210 Nicotine dependence, cigarettes, uncomplicated: Secondary | ICD-10-CM

## 2015-05-09 DIAGNOSIS — Z5111 Encounter for antineoplastic chemotherapy: Secondary | ICD-10-CM | POA: Diagnosis not present

## 2015-05-09 LAB — BASIC METABOLIC PANEL
ANION GAP: 6 (ref 5–15)
BUN: 16 mg/dL (ref 6–20)
CHLORIDE: 94 mmol/L — AB (ref 101–111)
CO2: 30 mmol/L (ref 22–32)
CREATININE: 0.8 mg/dL (ref 0.44–1.00)
Calcium: 9 mg/dL (ref 8.9–10.3)
GFR calc non Af Amer: >60 mL/min (ref >60)
Glucose, Bld: 115 mg/dL — ABNORMAL HIGH (ref 65–99)
Potassium: 4.4 mmol/L (ref 3.5–5.1)
SODIUM: 130 mmol/L — AB (ref 135–145)

## 2015-05-09 LAB — CBC WITH DIFFERENTIAL/PLATELET
BASOS PCT: 1 %
Basophils Absolute: 0 10*3/uL (ref 0–0.1)
EOS ABS: 0.3 10*3/uL (ref 0–0.7)
Eosinophils Relative: 4 %
HEMATOCRIT: 36 % (ref 35.0–47.0)
HEMOGLOBIN: 12.2 g/dL (ref 12.0–16.0)
LYMPHS ABS: 1.2 10*3/uL (ref 1.0–3.6)
Lymphocytes Relative: 19 %
MCH: 33.8 pg (ref 26.0–34.0)
MCHC: 33.8 g/dL (ref 32.0–36.0)
MCV: 100 fL (ref 80.0–100.0)
Monocytes Absolute: 0.7 10*3/uL (ref 0.2–0.9)
Monocytes Relative: 11 %
NEUTROS ABS: 4.2 10*3/uL (ref 1.4–6.5)
NEUTROS PCT: 65 %
Platelets: 216 10*3/uL (ref 150–440)
RBC: 3.6 MIL/uL — AB (ref 3.80–5.20)
RDW: 20 % — ABNORMAL HIGH (ref 11.5–14.5)
WBC: 6.4 10*3/uL (ref 3.6–11.0)

## 2015-05-09 MED ORDER — SODIUM CHLORIDE 0.9 % IV SOLN
Freq: Once | INTRAVENOUS | Status: AC
  Administered 2015-05-09: 11:00:00 via INTRAVENOUS
  Filled 2015-05-09: qty 1000

## 2015-05-09 MED ORDER — SODIUM CHLORIDE 0.9 % IV SOLN
173.8000 mg | Freq: Once | INTRAVENOUS | Status: AC
  Administered 2015-05-09: 170 mg via INTRAVENOUS
  Filled 2015-05-09: qty 17

## 2015-05-09 MED ORDER — SODIUM CHLORIDE 0.9 % IJ SOLN
10.0000 mL | Freq: Once | INTRAMUSCULAR | Status: AC
  Administered 2015-05-09: 10 mL via INTRAVENOUS
  Filled 2015-05-09: qty 10

## 2015-05-09 MED ORDER — SODIUM CHLORIDE 0.9 % IV SOLN
Freq: Once | INTRAVENOUS | Status: AC
  Administered 2015-05-09: 11:00:00 via INTRAVENOUS
  Filled 2015-05-09: qty 4

## 2015-05-09 MED ORDER — HEPARIN SOD (PORK) LOCK FLUSH 100 UNIT/ML IV SOLN
500.0000 [IU] | Freq: Once | INTRAVENOUS | Status: AC | PRN
  Administered 2015-05-09: 500 [IU]
  Filled 2015-05-09: qty 5

## 2015-05-09 NOTE — Progress Notes (Signed)
Patient is here for follow-up of lung cancer and carbo treatment. Patient states that she has been staying nauseated all the time. She states that she has nausea medicine but it is not helping. She states that because of this, she has not been eating much at all.

## 2015-05-10 ENCOUNTER — Inpatient Hospital Stay: Payer: Medicare Other

## 2015-05-10 ENCOUNTER — Ambulatory Visit
Admission: RE | Admit: 2015-05-10 | Discharge: 2015-05-10 | Disposition: A | Discharge status: Home / Self Care | Payer: Medicare Other | Source: Ambulatory Visit | Attending: Radiation Oncology | Admitting: Radiation Oncology

## 2015-05-10 DIAGNOSIS — Z510 Encounter for antineoplastic radiation therapy: Secondary | ICD-10-CM | POA: Diagnosis not present

## 2015-05-11 ENCOUNTER — Ambulatory Visit: Payer: Medicare Other

## 2015-05-11 ENCOUNTER — Ambulatory Visit
Admission: RE | Admit: 2015-05-11 | Discharge: 2015-05-11 | Disposition: A | Discharge status: Home / Self Care | Payer: Medicare Other | Source: Ambulatory Visit | Attending: Radiation Oncology | Admitting: Radiation Oncology

## 2015-05-11 ENCOUNTER — Inpatient Hospital Stay: Payer: Medicare Other

## 2015-05-12 ENCOUNTER — Ambulatory Visit
Admission: RE | Admit: 2015-05-12 | Discharge: 2015-05-12 | Disposition: A | Discharge status: Home / Self Care | Payer: Medicare Other | Source: Ambulatory Visit | Attending: Radiation Oncology | Admitting: Radiation Oncology

## 2015-05-12 ENCOUNTER — Inpatient Hospital Stay: Payer: Medicare Other

## 2015-05-12 ENCOUNTER — Ambulatory Visit: Payer: Medicare Other

## 2015-05-15 ENCOUNTER — Other Ambulatory Visit: Payer: Self-pay | Admitting: Internal Medicine

## 2015-05-15 ENCOUNTER — Inpatient Hospital Stay: Payer: Medicare Other

## 2015-05-15 ENCOUNTER — Ambulatory Visit
Admission: RE | Admit: 2015-05-15 | Discharge: 2015-05-15 | Disposition: A | Discharge status: Home / Self Care | Payer: Medicare Other | Source: Ambulatory Visit | Attending: Radiation Oncology | Admitting: Radiation Oncology

## 2015-05-15 ENCOUNTER — Other Ambulatory Visit: Payer: Self-pay | Admitting: *Deleted

## 2015-05-15 ENCOUNTER — Inpatient Hospital Stay: Payer: Medicare Other | Attending: Internal Medicine

## 2015-05-15 DIAGNOSIS — R093 Abnormal sputum: Secondary | ICD-10-CM | POA: Insufficient documentation

## 2015-05-15 DIAGNOSIS — Z5111 Encounter for antineoplastic chemotherapy: Secondary | ICD-10-CM | POA: Insufficient documentation

## 2015-05-15 DIAGNOSIS — R05 Cough: Secondary | ICD-10-CM | POA: Insufficient documentation

## 2015-05-15 DIAGNOSIS — F17210 Nicotine dependence, cigarettes, uncomplicated: Secondary | ICD-10-CM | POA: Insufficient documentation

## 2015-05-15 DIAGNOSIS — D72829 Elevated white blood cell count, unspecified: Secondary | ICD-10-CM | POA: Diagnosis not present

## 2015-05-15 DIAGNOSIS — K219 Gastro-esophageal reflux disease without esophagitis: Secondary | ICD-10-CM | POA: Insufficient documentation

## 2015-05-15 DIAGNOSIS — C679 Malignant neoplasm of bladder, unspecified: Secondary | ICD-10-CM

## 2015-05-15 DIAGNOSIS — I129 Hypertensive chronic kidney disease with stage 1 through stage 4 chronic kidney disease, or unspecified chronic kidney disease: Secondary | ICD-10-CM | POA: Diagnosis not present

## 2015-05-15 DIAGNOSIS — F419 Anxiety disorder, unspecified: Secondary | ICD-10-CM | POA: Diagnosis not present

## 2015-05-15 DIAGNOSIS — M549 Dorsalgia, unspecified: Secondary | ICD-10-CM | POA: Insufficient documentation

## 2015-05-15 DIAGNOSIS — Z79899 Other long term (current) drug therapy: Secondary | ICD-10-CM | POA: Insufficient documentation

## 2015-05-15 DIAGNOSIS — R197 Diarrhea, unspecified: Secondary | ICD-10-CM | POA: Insufficient documentation

## 2015-05-15 DIAGNOSIS — R300 Dysuria: Secondary | ICD-10-CM

## 2015-05-15 DIAGNOSIS — G8929 Other chronic pain: Secondary | ICD-10-CM | POA: Diagnosis not present

## 2015-05-15 DIAGNOSIS — N189 Chronic kidney disease, unspecified: Secondary | ICD-10-CM | POA: Diagnosis not present

## 2015-05-15 DIAGNOSIS — Z9981 Dependence on supplemental oxygen: Secondary | ICD-10-CM | POA: Diagnosis not present

## 2015-05-15 DIAGNOSIS — D751 Secondary polycythemia: Secondary | ICD-10-CM | POA: Insufficient documentation

## 2015-05-15 DIAGNOSIS — R5383 Other fatigue: Secondary | ICD-10-CM | POA: Insufficient documentation

## 2015-05-15 DIAGNOSIS — J449 Chronic obstructive pulmonary disease, unspecified: Secondary | ICD-10-CM | POA: Diagnosis not present

## 2015-05-15 DIAGNOSIS — Z510 Encounter for antineoplastic radiation therapy: Secondary | ICD-10-CM | POA: Diagnosis not present

## 2015-05-15 LAB — URINALYSIS COMPLETE WITH MICROSCOPIC (ARMC ONLY)
Bilirubin Urine: NEGATIVE
Glucose, UA: NEGATIVE mg/dL
HGB URINE DIPSTICK: NEGATIVE
NITRITE: NEGATIVE
Protein, ur: 100 mg/dL — AB
Specific Gravity, Urine: 1.024 (ref 1.005–1.030)
pH: 6 (ref 5.0–8.0)

## 2015-05-15 LAB — BASIC METABOLIC PANEL
ANION GAP: 6 (ref 5–15)
BUN: 15 mg/dL (ref 6–20)
CHLORIDE: 89 mmol/L — AB (ref 101–111)
CO2: 34 mmol/L — AB (ref 22–32)
CREATININE: 0.72 mg/dL (ref 0.44–1.00)
Calcium: 8.5 mg/dL — ABNORMAL LOW (ref 8.9–10.3)
GFR calc non Af Amer: >60 mL/min (ref >60)
Glucose, Bld: 83 mg/dL (ref 65–99)
POTASSIUM: 3.9 mmol/L (ref 3.5–5.1)
SODIUM: 129 mmol/L — AB (ref 135–145)

## 2015-05-15 LAB — CBC WITH DIFFERENTIAL/PLATELET
BASOS PCT: 0 %
Basophils Absolute: 0 10*3/uL (ref 0–0.1)
EOS ABS: 0.4 10*3/uL (ref 0–0.7)
Eosinophils Relative: 7 %
HEMATOCRIT: 39.4 % (ref 35.0–47.0)
HEMOGLOBIN: 13.3 g/dL (ref 12.0–16.0)
Lymphocytes Relative: 23 %
Lymphs Abs: 1.2 10*3/uL (ref 1.0–3.6)
MCH: 33.7 pg (ref 26.0–34.0)
MCHC: 33.6 g/dL (ref 32.0–36.0)
MCV: 100.1 fL — ABNORMAL HIGH (ref 80.0–100.0)
MONOS PCT: 9 %
Monocytes Absolute: 0.5 10*3/uL (ref 0.2–0.9)
NEUTROS ABS: 3.2 10*3/uL (ref 1.4–6.5)
NEUTROS PCT: 61 %
Platelets: 162 10*3/uL (ref 150–440)
RBC: 3.94 MIL/uL (ref 3.80–5.20)
RDW: 18.8 % — ABNORMAL HIGH (ref 11.5–14.5)
WBC: 5.3 10*3/uL (ref 3.6–11.0)

## 2015-05-15 MED ORDER — CIPROFLOXACIN HCL 500 MG PO TABS: 500.0000 mg | ORAL_TABLET | Freq: Two times a day (BID) | ORAL | Status: DC

## 2015-05-16 ENCOUNTER — Inpatient Hospital Stay: Payer: Medicare Other

## 2015-05-16 ENCOUNTER — Ambulatory Visit
Admission: RE | Admit: 2015-05-16 | Discharge: 2015-05-16 | Disposition: A | Discharge status: Home / Self Care | Payer: Medicare Other | Source: Ambulatory Visit | Attending: Radiation Oncology | Admitting: Radiation Oncology

## 2015-05-16 VITALS — BP 122/81 | HR 89 | Resp 20

## 2015-05-16 DIAGNOSIS — Z5111 Encounter for antineoplastic chemotherapy: Secondary | ICD-10-CM | POA: Diagnosis not present

## 2015-05-16 DIAGNOSIS — Z510 Encounter for antineoplastic radiation therapy: Secondary | ICD-10-CM | POA: Diagnosis not present

## 2015-05-16 DIAGNOSIS — C679 Malignant neoplasm of bladder, unspecified: Secondary | ICD-10-CM

## 2015-05-16 MED ORDER — SODIUM CHLORIDE 0.9 % IV SOLN
Freq: Once | INTRAVENOUS | Status: AC
Start: 2015-05-16 — End: 2015-05-16
  Administered 2015-05-16: 11:00:00 via INTRAVENOUS
  Filled 2015-05-16: qty 1000

## 2015-05-16 MED ORDER — HEPARIN SOD (PORK) LOCK FLUSH 100 UNIT/ML IV SOLN
500.0000 [IU] | Freq: Once | INTRAVENOUS | Status: AC | PRN
  Administered 2015-05-16: 500 [IU]
  Filled 2015-05-16: qty 5

## 2015-05-16 MED ORDER — SODIUM CHLORIDE 0.9 % IV SOLN
173.8000 mg | Freq: Once | INTRAVENOUS | Status: AC
  Administered 2015-05-16: 170 mg via INTRAVENOUS
  Filled 2015-05-16: qty 17

## 2015-05-16 MED ORDER — SODIUM CHLORIDE 0.9 % IJ SOLN
10.0000 mL | INTRAMUSCULAR | Status: DC | PRN
  Administered 2015-05-16: 10 mL
  Filled 2015-05-16: qty 10

## 2015-05-16 MED ORDER — SODIUM CHLORIDE 0.9 % IV SOLN
Freq: Once | INTRAVENOUS | Status: AC
  Administered 2015-05-16: 12:00:00 via INTRAVENOUS
  Filled 2015-05-16: qty 4

## 2015-05-17 ENCOUNTER — Ambulatory Visit
Admission: RE | Admit: 2015-05-17 | Discharge: 2015-05-17 | Disposition: A | Discharge status: Home / Self Care | Payer: Medicare Other | Source: Ambulatory Visit | Attending: Radiation Oncology | Admitting: Radiation Oncology

## 2015-05-17 ENCOUNTER — Inpatient Hospital Stay: Payer: Medicare Other

## 2015-05-17 DIAGNOSIS — Z510 Encounter for antineoplastic radiation therapy: Secondary | ICD-10-CM | POA: Diagnosis not present

## 2015-05-18 ENCOUNTER — Ambulatory Visit: Payer: Medicare Other

## 2015-05-18 ENCOUNTER — Ambulatory Visit
Admission: RE | Admit: 2015-05-18 | Discharge: 2015-05-18 | Disposition: A | Discharge status: Home / Self Care | Payer: Medicare Other | Source: Ambulatory Visit | Attending: Radiation Oncology | Admitting: Radiation Oncology

## 2015-05-18 ENCOUNTER — Inpatient Hospital Stay: Payer: Medicare Other

## 2015-05-19 ENCOUNTER — Ambulatory Visit
Admission: RE | Admit: 2015-05-19 | Discharge: 2015-05-19 | Disposition: A | Discharge status: Home / Self Care | Payer: Medicare Other | Source: Ambulatory Visit | Attending: Radiation Oncology | Admitting: Radiation Oncology

## 2015-05-19 ENCOUNTER — Inpatient Hospital Stay: Payer: Medicare Other

## 2015-05-19 DIAGNOSIS — Z510 Encounter for antineoplastic radiation therapy: Secondary | ICD-10-CM | POA: Diagnosis not present
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
## 2015-05-22 ENCOUNTER — Inpatient Hospital Stay: Payer: Medicare Other

## 2015-05-22 ENCOUNTER — Ambulatory Visit
Admission: RE | Admit: 2015-05-22 | Discharge: 2015-05-22 | Disposition: A | Discharge status: Home / Self Care | Payer: Medicare Other | Source: Ambulatory Visit | Attending: Radiation Oncology | Admitting: Radiation Oncology

## 2015-05-22 DIAGNOSIS — Z510 Encounter for antineoplastic radiation therapy: Secondary | ICD-10-CM | POA: Diagnosis not present

## 2015-05-23 ENCOUNTER — Ambulatory Visit
Admission: RE | Admit: 2015-05-23 | Discharge: 2015-05-23 | Disposition: A | Discharge status: Home / Self Care | Payer: Medicare Other | Source: Ambulatory Visit | Attending: Radiation Oncology | Admitting: Radiation Oncology

## 2015-05-23 ENCOUNTER — Inpatient Hospital Stay: Payer: Medicare Other

## 2015-05-23 DIAGNOSIS — Z510 Encounter for antineoplastic radiation therapy: Secondary | ICD-10-CM | POA: Diagnosis not present

## 2015-05-23 DIAGNOSIS — Z5111 Encounter for antineoplastic chemotherapy: Secondary | ICD-10-CM | POA: Diagnosis not present

## 2015-05-23 DIAGNOSIS — C679 Malignant neoplasm of bladder, unspecified: Secondary | ICD-10-CM

## 2015-05-23 LAB — CBC WITH DIFFERENTIAL/PLATELET
BASOS ABS: 0 10*3/uL (ref 0–0.1)
Basophils Relative: 1 %
EOS PCT: 7 %
Eosinophils Absolute: 0.2 10*3/uL (ref 0–0.7)
HEMATOCRIT: 39.3 % (ref 35.0–47.0)
Hemoglobin: 13.2 g/dL (ref 12.0–16.0)
LYMPHS ABS: 0.9 10*3/uL — AB (ref 1.0–3.6)
LYMPHS PCT: 26 %
MCH: 33.6 pg (ref 26.0–34.0)
MCHC: 33.7 g/dL (ref 32.0–36.0)
MCV: 99.8 fL (ref 80.0–100.0)
MONO ABS: 0.4 10*3/uL (ref 0.2–0.9)
MONOS PCT: 12 %
NEUTROS ABS: 1.9 10*3/uL (ref 1.4–6.5)
Neutrophils Relative %: 54 %
PLATELETS: 109 10*3/uL — AB (ref 150–440)
RBC: 3.94 MIL/uL (ref 3.80–5.20)
RDW: 17.8 % — AB (ref 11.5–14.5)
WBC: 3.5 10*3/uL — ABNORMAL LOW (ref 3.6–11.0)

## 2015-05-23 LAB — HEPATIC FUNCTION PANEL
ALT: 10 U/L — AB (ref 14–54)
AST: 19 U/L (ref 15–41)
Albumin: 3.5 g/dL (ref 3.5–5.0)
Alkaline Phosphatase: 77 U/L (ref 38–126)
BILIRUBIN TOTAL: 0.5 mg/dL (ref 0.3–1.2)
Total Protein: 7.1 g/dL (ref 6.5–8.1)

## 2015-05-23 LAB — BASIC METABOLIC PANEL
ANION GAP: 5 (ref 5–15)
BUN: 15 mg/dL (ref 6–20)
CO2: 33 mmol/L — AB (ref 22–32)
Calcium: 8.9 mg/dL (ref 8.9–10.3)
Chloride: 95 mmol/L — ABNORMAL LOW (ref 101–111)
Creatinine, Ser: 0.63 mg/dL (ref 0.44–1.00)
GFR calc Af Amer: >60 mL/min (ref >60)
GLUCOSE: 109 mg/dL — AB (ref 65–99)
POTASSIUM: 3.8 mmol/L (ref 3.5–5.1)
Sodium: 133 mmol/L — ABNORMAL LOW (ref 135–145)

## 2015-05-23 MED ORDER — HEPARIN SOD (PORK) LOCK FLUSH 100 UNIT/ML IV SOLN
500.0000 [IU] | Freq: Once | INTRAVENOUS | Status: DC | PRN
  Filled 2015-05-23: qty 5

## 2015-05-23 MED ORDER — SODIUM CHLORIDE 0.9 % IV SOLN
Freq: Once | INTRAVENOUS | Status: AC
  Administered 2015-05-23: 12:00:00 via INTRAVENOUS
  Filled 2015-05-23: qty 4

## 2015-05-23 MED ORDER — SODIUM CHLORIDE 0.9 % IV SOLN
Freq: Once | INTRAVENOUS | Status: AC
  Administered 2015-05-23: 12:00:00 via INTRAVENOUS
  Filled 2015-05-23: qty 1000

## 2015-05-23 MED ORDER — SODIUM CHLORIDE 0.9 % IV SOLN
173.8000 mg | Freq: Once | INTRAVENOUS | Status: AC
  Administered 2015-05-23: 170 mg via INTRAVENOUS
  Filled 2015-05-23: qty 17

## 2015-05-24 ENCOUNTER — Ambulatory Visit: Payer: Medicare Other

## 2015-05-24 ENCOUNTER — Ambulatory Visit
Admission: RE | Admit: 2015-05-24 | Discharge: 2015-05-24 | Disposition: A | Discharge status: Home / Self Care | Payer: Medicare Other | Source: Ambulatory Visit | Attending: Radiation Oncology | Admitting: Radiation Oncology

## 2015-05-24 ENCOUNTER — Inpatient Hospital Stay: Payer: Medicare Other

## 2015-05-24 DIAGNOSIS — Z510 Encounter for antineoplastic radiation therapy: Secondary | ICD-10-CM | POA: Diagnosis not present

## 2015-05-25 ENCOUNTER — Inpatient Hospital Stay: Payer: Medicare Other

## 2015-05-25 ENCOUNTER — Ambulatory Visit
Admission: RE | Admit: 2015-05-25 | Discharge: 2015-05-25 | Disposition: A | Discharge status: Home / Self Care | Payer: Medicare Other | Source: Ambulatory Visit | Attending: Radiation Oncology | Admitting: Radiation Oncology

## 2015-05-25 ENCOUNTER — Ambulatory Visit: Payer: Medicare Other

## 2015-05-25 DIAGNOSIS — Z510 Encounter for antineoplastic radiation therapy: Secondary | ICD-10-CM | POA: Diagnosis not present

## 2015-05-26 ENCOUNTER — Inpatient Hospital Stay: Payer: Medicare Other

## 2015-05-26 ENCOUNTER — Ambulatory Visit: Payer: Medicare Other

## 2015-05-26 ENCOUNTER — Ambulatory Visit
Admission: RE | Admit: 2015-05-26 | Discharge: 2015-05-26 | Disposition: A | Discharge status: Home / Self Care | Payer: Medicare Other | Source: Ambulatory Visit | Attending: Radiation Oncology | Admitting: Radiation Oncology

## 2015-05-29 ENCOUNTER — Inpatient Hospital Stay: Payer: Medicare Other

## 2015-05-29 ENCOUNTER — Ambulatory Visit
Admission: RE | Admit: 2015-05-29 | Discharge: 2015-05-29 | Disposition: A | Discharge status: Home / Self Care | Payer: Medicare Other | Source: Ambulatory Visit | Attending: Radiation Oncology | Admitting: Radiation Oncology

## 2015-05-29 DIAGNOSIS — Z510 Encounter for antineoplastic radiation therapy: Secondary | ICD-10-CM | POA: Diagnosis not present

## 2015-05-30 ENCOUNTER — Inpatient Hospital Stay: Payer: Medicare Other

## 2015-05-30 ENCOUNTER — Encounter: Payer: Self-pay | Admitting: Oncology

## 2015-05-30 ENCOUNTER — Inpatient Hospital Stay (HOSPITAL_BASED_OUTPATIENT_CLINIC_OR_DEPARTMENT_OTHER): Payer: Medicare Other | Admitting: Family Medicine

## 2015-05-30 ENCOUNTER — Ambulatory Visit
Admission: RE | Admit: 2015-05-30 | Discharge: 2015-05-30 | Disposition: A | Discharge status: Home / Self Care | Payer: Medicare Other | Source: Ambulatory Visit | Attending: Radiation Oncology | Admitting: Radiation Oncology

## 2015-05-30 VITALS — BP 129/85 | HR 109 | Temp 98.0°F | Wt 141.0 lb

## 2015-05-30 VITALS — BP 128/82 | HR 98 | Temp 97.4°F | Resp 20

## 2015-05-30 DIAGNOSIS — C679 Malignant neoplasm of bladder, unspecified: Secondary | ICD-10-CM

## 2015-05-30 DIAGNOSIS — Z9981 Dependence on supplemental oxygen: Secondary | ICD-10-CM

## 2015-05-30 DIAGNOSIS — R05 Cough: Secondary | ICD-10-CM

## 2015-05-30 DIAGNOSIS — J449 Chronic obstructive pulmonary disease, unspecified: Secondary | ICD-10-CM

## 2015-05-30 DIAGNOSIS — R5383 Other fatigue: Secondary | ICD-10-CM | POA: Diagnosis not present

## 2015-05-30 DIAGNOSIS — G8929 Other chronic pain: Secondary | ICD-10-CM | POA: Diagnosis not present

## 2015-05-30 DIAGNOSIS — J4489 Other specified chronic obstructive pulmonary disease: Secondary | ICD-10-CM

## 2015-05-30 DIAGNOSIS — I129 Hypertensive chronic kidney disease with stage 1 through stage 4 chronic kidney disease, or unspecified chronic kidney disease: Secondary | ICD-10-CM

## 2015-05-30 DIAGNOSIS — Z510 Encounter for antineoplastic radiation therapy: Secondary | ICD-10-CM | POA: Diagnosis not present

## 2015-05-30 DIAGNOSIS — M549 Dorsalgia, unspecified: Secondary | ICD-10-CM

## 2015-05-30 DIAGNOSIS — Z5111 Encounter for antineoplastic chemotherapy: Secondary | ICD-10-CM | POA: Diagnosis not present

## 2015-05-30 DIAGNOSIS — R093 Abnormal sputum: Secondary | ICD-10-CM

## 2015-05-30 DIAGNOSIS — N189 Chronic kidney disease, unspecified: Secondary | ICD-10-CM

## 2015-05-30 DIAGNOSIS — Z79899 Other long term (current) drug therapy: Secondary | ICD-10-CM

## 2015-05-30 DIAGNOSIS — K219 Gastro-esophageal reflux disease without esophagitis: Secondary | ICD-10-CM

## 2015-05-30 LAB — CBC WITH DIFFERENTIAL/PLATELET
Basophils Absolute: 0 10*3/uL (ref 0–0.1)
Basophils Relative: 0 %
EOS ABS: 0.1 10*3/uL (ref 0–0.7)
EOS PCT: 3 %
HCT: 38.4 % (ref 35.0–47.0)
Hemoglobin: 13.2 g/dL (ref 12.0–16.0)
LYMPHS ABS: 1 10*3/uL (ref 1.0–3.6)
LYMPHS PCT: 28 %
MCH: 34 pg (ref 26.0–34.0)
MCHC: 34.3 g/dL (ref 32.0–36.0)
MCV: 99.1 fL (ref 80.0–100.0)
MONO ABS: 0.5 10*3/uL (ref 0.2–0.9)
Monocytes Relative: 15 %
Neutro Abs: 2 10*3/uL (ref 1.4–6.5)
Neutrophils Relative %: 54 %
PLATELETS: 110 10*3/uL — AB (ref 150–440)
RBC: 3.87 MIL/uL (ref 3.80–5.20)
RDW: 17.6 % — AB (ref 11.5–14.5)
WBC: 3.6 10*3/uL (ref 3.6–11.0)

## 2015-05-30 LAB — BASIC METABOLIC PANEL
Anion gap: 4 — ABNORMAL LOW (ref 5–15)
BUN: 16 mg/dL (ref 6–20)
CALCIUM: 8.4 mg/dL — AB (ref 8.9–10.3)
CO2: 33 mmol/L — ABNORMAL HIGH (ref 22–32)
CREATININE: 0.67 mg/dL (ref 0.44–1.00)
Chloride: 93 mmol/L — ABNORMAL LOW (ref 101–111)
GFR calc Af Amer: >60 mL/min (ref >60)
GLUCOSE: 87 mg/dL (ref 65–99)
Potassium: 4 mmol/L (ref 3.5–5.1)
SODIUM: 130 mmol/L — AB (ref 135–145)

## 2015-05-30 MED ORDER — SODIUM CHLORIDE 0.9 % IJ SOLN
10.0000 mL | INTRAMUSCULAR | Status: DC | PRN
  Filled 2015-05-30: qty 10

## 2015-05-30 MED ORDER — AMOXICILLIN-POT CLAVULANATE 200-28.5 MG PO CHEW: 1.0000 | CHEWABLE_TABLET | Freq: Two times a day (BID) | ORAL | Status: DC

## 2015-05-30 MED ORDER — SODIUM CHLORIDE 0.9 % IV SOLN
Freq: Once | INTRAVENOUS | Status: AC
  Administered 2015-05-30: 12:00:00 via INTRAVENOUS
  Filled 2015-05-30: qty 1000

## 2015-05-30 MED ORDER — PREDNISONE 10 MG PO TABS: ORAL_TABLET | ORAL | Status: DC

## 2015-05-30 MED ORDER — AMOXICILLIN-POT CLAVULANATE 875-125 MG PO TABS: 1.0000 | ORAL_TABLET | Freq: Two times a day (BID) | ORAL | Status: DC

## 2015-05-30 MED ORDER — HEPARIN SOD (PORK) LOCK FLUSH 100 UNIT/ML IV SOLN
500.0000 [IU] | Freq: Once | INTRAVENOUS | Status: AC
  Administered 2015-05-30: 500 [IU] via INTRAVENOUS
  Filled 2015-05-30: qty 5

## 2015-05-30 MED ORDER — SODIUM CHLORIDE 0.9 % IV SOLN
173.8000 mg | Freq: Once | INTRAVENOUS | Status: AC
  Administered 2015-05-30: 170 mg via INTRAVENOUS
  Filled 2015-05-30: qty 17

## 2015-05-30 MED ORDER — SODIUM CHLORIDE 0.9 % IV SOLN
Freq: Once | INTRAVENOUS | Status: AC
  Administered 2015-05-30: 13:00:00 via INTRAVENOUS
  Filled 2015-05-30: qty 4
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
## 2015-05-31 ENCOUNTER — Inpatient Hospital Stay: Payer: Medicare Other

## 2015-05-31 ENCOUNTER — Ambulatory Visit
Admission: RE | Admit: 2015-05-31 | Discharge: 2015-05-31 | Disposition: A | Discharge status: Home / Self Care | Payer: Medicare Other | Source: Ambulatory Visit | Attending: Radiation Oncology | Admitting: Radiation Oncology

## 2015-05-31 ENCOUNTER — Ambulatory Visit: Payer: Medicare Other

## 2015-06-01 ENCOUNTER — Ambulatory Visit: Payer: Medicare Other

## 2015-06-01 ENCOUNTER — Inpatient Hospital Stay: Payer: Medicare Other

## 2015-06-01 ENCOUNTER — Encounter: Payer: Self-pay | Admitting: *Deleted

## 2015-06-01 ENCOUNTER — Ambulatory Visit: Payer: Medicare Other | Admitting: Internal Medicine

## 2015-06-01 ENCOUNTER — Ambulatory Visit
Admission: RE | Admit: 2015-06-01 | Discharge: 2015-06-01 | Disposition: A | Discharge status: Home / Self Care | Payer: Medicare Other | Source: Ambulatory Visit | Attending: Radiation Oncology | Admitting: Radiation Oncology

## 2015-06-02 ENCOUNTER — Ambulatory Visit: Payer: Medicare Other

## 2015-06-02 ENCOUNTER — Inpatient Hospital Stay: Payer: Medicare Other

## 2015-06-05 ENCOUNTER — Ambulatory Visit: Payer: Medicare Other

## 2015-06-05 ENCOUNTER — Inpatient Hospital Stay: Payer: Medicare Other

## 2015-06-06 ENCOUNTER — Inpatient Hospital Stay: Payer: Medicare Other

## 2015-06-06 ENCOUNTER — Other Ambulatory Visit: Payer: Self-pay | Admitting: Hematology and Oncology

## 2015-06-06 ENCOUNTER — Other Ambulatory Visit: Payer: Self-pay | Admitting: *Deleted

## 2015-06-06 ENCOUNTER — Inpatient Hospital Stay (HOSPITAL_BASED_OUTPATIENT_CLINIC_OR_DEPARTMENT_OTHER): Payer: Medicare Other | Admitting: Hematology and Oncology

## 2015-06-06 ENCOUNTER — Ambulatory Visit: Payer: Medicare Other

## 2015-06-06 VITALS — BP 112/75 | HR 92 | Temp 97.0°F | Resp 18 | Ht 67.0 in | Wt 147.3 lb

## 2015-06-06 DIAGNOSIS — C679 Malignant neoplasm of bladder, unspecified: Secondary | ICD-10-CM | POA: Diagnosis not present

## 2015-06-06 DIAGNOSIS — I129 Hypertensive chronic kidney disease with stage 1 through stage 4 chronic kidney disease, or unspecified chronic kidney disease: Secondary | ICD-10-CM

## 2015-06-06 DIAGNOSIS — R197 Diarrhea, unspecified: Secondary | ICD-10-CM | POA: Diagnosis not present

## 2015-06-06 DIAGNOSIS — D72829 Elevated white blood cell count, unspecified: Secondary | ICD-10-CM

## 2015-06-06 DIAGNOSIS — Z9981 Dependence on supplemental oxygen: Secondary | ICD-10-CM

## 2015-06-06 DIAGNOSIS — J449 Chronic obstructive pulmonary disease, unspecified: Secondary | ICD-10-CM

## 2015-06-06 DIAGNOSIS — N189 Chronic kidney disease, unspecified: Secondary | ICD-10-CM

## 2015-06-06 DIAGNOSIS — Z5111 Encounter for antineoplastic chemotherapy: Secondary | ICD-10-CM | POA: Diagnosis not present

## 2015-06-06 DIAGNOSIS — D751 Secondary polycythemia: Secondary | ICD-10-CM | POA: Diagnosis not present

## 2015-06-06 DIAGNOSIS — Z79899 Other long term (current) drug therapy: Secondary | ICD-10-CM

## 2015-06-06 DIAGNOSIS — F17210 Nicotine dependence, cigarettes, uncomplicated: Secondary | ICD-10-CM

## 2015-06-06 LAB — COMPREHENSIVE METABOLIC PANEL
ALT: 37 U/L (ref 14–54)
AST: 40 U/L (ref 15–41)
Albumin: 3.3 g/dL — ABNORMAL LOW (ref 3.5–5.0)
Alkaline Phosphatase: 82 U/L (ref 38–126)
Anion gap: 4 — ABNORMAL LOW (ref 5–15)
BUN: 12 mg/dL (ref 6–20)
CO2: 34 mmol/L — ABNORMAL HIGH (ref 22–32)
Calcium: 8.3 mg/dL — ABNORMAL LOW (ref 8.9–10.3)
Chloride: 93 mmol/L — ABNORMAL LOW (ref 101–111)
Creatinine, Ser: 0.65 mg/dL (ref 0.44–1.00)
GFR calc Af Amer: >60 mL/min (ref >60)
GFR calc non Af Amer: >60 mL/min (ref >60)
Glucose, Bld: 108 mg/dL — ABNORMAL HIGH (ref 65–99)
Potassium: 3.6 mmol/L (ref 3.5–5.1)
Sodium: 131 mmol/L — ABNORMAL LOW (ref 135–145)
Total Bilirubin: 0.4 mg/dL (ref 0.3–1.2)
Total Protein: 6.6 g/dL (ref 6.5–8.1)

## 2015-06-06 LAB — CBC WITH DIFFERENTIAL/PLATELET
Basophils Absolute: 0 10*3/uL (ref 0–0.1)
Basophils Relative: 0 %
Eosinophils Absolute: 0.1 10*3/uL (ref 0–0.7)
Eosinophils Relative: 3 %
HCT: 38.8 % (ref 35.0–47.0)
Hemoglobin: 13.1 g/dL (ref 12.0–16.0)
Lymphocytes Relative: 30 %
Lymphs Abs: 1.1 10*3/uL (ref 1.0–3.6)
MCH: 33.3 pg (ref 26.0–34.0)
MCHC: 33.7 g/dL (ref 32.0–36.0)
MCV: 99 fL (ref 80.0–100.0)
Monocytes Absolute: 0.5 10*3/uL (ref 0.2–0.9)
Monocytes Relative: 13 %
Neutro Abs: 1.9 10*3/uL (ref 1.4–6.5)
Neutrophils Relative %: 54 %
Platelets: 116 10*3/uL — ABNORMAL LOW (ref 150–440)
RBC: 3.93 MIL/uL (ref 3.80–5.20)
RDW: 17.7 % — ABNORMAL HIGH (ref 11.5–14.5)
WBC: 3.5 10*3/uL — ABNORMAL LOW (ref 3.6–11.0)

## 2015-06-06 MED ORDER — HEPARIN SOD (PORK) LOCK FLUSH 100 UNIT/ML IV SOLN
500.0000 [IU] | Freq: Once | INTRAVENOUS | Status: AC
  Administered 2015-06-06: 500 [IU] via INTRAVENOUS

## 2015-06-06 MED ORDER — DIPHENOXYLATE-ATROPINE 2.5-0.025 MG PO TABS: 1.0000 | ORAL_TABLET | Freq: Four times a day (QID) | ORAL | Status: DC | PRN

## 2015-06-06 MED ORDER — HEPARIN SOD (PORK) LOCK FLUSH 100 UNIT/ML IV SOLN
INTRAVENOUS | Status: AC
  Filled 2015-06-06: qty 5
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
## 2015-06-06 NOTE — Progress Notes (Signed)
Patient is here for follow-up of bladder cancer and carboplatin treatment. Patient states that her radiation is on hold for the rest of this week due to diarrhea. Patient has been having a lot of diarrhea. Dr. Baruch Gouty prescribed her some lomotil today and since taking the first dose she has not had any diarrhea.

## 2015-06-07 ENCOUNTER — Inpatient Hospital Stay: Payer: Medicare Other

## 2015-06-07 ENCOUNTER — Ambulatory Visit: Payer: Medicare Other

## 2015-06-07 DIAGNOSIS — Z510 Encounter for antineoplastic radiation therapy: Secondary | ICD-10-CM | POA: Diagnosis not present

## 2015-06-08 ENCOUNTER — Inpatient Hospital Stay: Payer: Medicare Other

## 2015-06-08 ENCOUNTER — Ambulatory Visit: Payer: Medicare Other

## 2015-06-09 ENCOUNTER — Inpatient Hospital Stay: Payer: Medicare Other

## 2015-06-09 ENCOUNTER — Ambulatory Visit: Payer: Medicare Other

## 2015-06-09 DIAGNOSIS — Z510 Encounter for antineoplastic radiation therapy: Secondary | ICD-10-CM | POA: Diagnosis not present

## 2015-06-11 ENCOUNTER — Ambulatory Visit
Admission: RE | Admit: 2015-06-11 | Discharge: 2015-06-11 | Disposition: A | Discharge status: Home / Self Care | Payer: Medicare Other | Source: Ambulatory Visit | Attending: Radiation Oncology | Admitting: Radiation Oncology

## 2015-06-12 ENCOUNTER — Ambulatory Visit: Payer: Medicare Other

## 2015-06-12 ENCOUNTER — Emergency Department
Admission: EM | Admit: 2015-06-12 | Discharge: 2015-06-12 | Disposition: A | Discharge status: Home / Self Care | Payer: Medicare Other | Attending: Student | Admitting: Student

## 2015-06-12 ENCOUNTER — Emergency Department: Payer: Medicare Other

## 2015-06-12 ENCOUNTER — Ambulatory Visit
Admit: 2015-06-12 | Discharge: 2015-06-12 | Disposition: A | Discharge status: Home / Self Care | Payer: Medicare Other | Attending: Radiation Oncology | Admitting: Radiation Oncology

## 2015-06-12 ENCOUNTER — Inpatient Hospital Stay: Payer: Medicare Other

## 2015-06-12 ENCOUNTER — Encounter: Payer: Self-pay | Admitting: Emergency Medicine

## 2015-06-12 DIAGNOSIS — Z79899 Other long term (current) drug therapy: Secondary | ICD-10-CM | POA: Insufficient documentation

## 2015-06-12 DIAGNOSIS — N3090 Cystitis, unspecified without hematuria: Secondary | ICD-10-CM | POA: Diagnosis not present

## 2015-06-12 DIAGNOSIS — Z91040 Latex allergy status: Secondary | ICD-10-CM | POA: Diagnosis not present

## 2015-06-12 DIAGNOSIS — N189 Chronic kidney disease, unspecified: Secondary | ICD-10-CM | POA: Insufficient documentation

## 2015-06-12 DIAGNOSIS — R531 Weakness: Secondary | ICD-10-CM | POA: Insufficient documentation

## 2015-06-12 DIAGNOSIS — I129 Hypertensive chronic kidney disease with stage 1 through stage 4 chronic kidney disease, or unspecified chronic kidney disease: Secondary | ICD-10-CM | POA: Diagnosis not present

## 2015-06-12 DIAGNOSIS — E860 Dehydration: Secondary | ICD-10-CM | POA: Insufficient documentation

## 2015-06-12 DIAGNOSIS — Z7951 Long term (current) use of inhaled steroids: Secondary | ICD-10-CM | POA: Diagnosis not present

## 2015-06-12 DIAGNOSIS — Z720 Tobacco use: Secondary | ICD-10-CM | POA: Insufficient documentation

## 2015-06-12 LAB — BASIC METABOLIC PANEL WITH GFR
Anion gap: 5 (ref 5–15)
BUN: 12 mg/dL (ref 6–20)
CO2: 37 mmol/L — ABNORMAL HIGH (ref 22–32)
Calcium: 9.5 mg/dL (ref 8.9–10.3)
Chloride: 91 mmol/L — ABNORMAL LOW (ref 101–111)
Creatinine, Ser: 0.64 mg/dL (ref 0.44–1.00)
GFR calc Af Amer: >60 mL/min (ref >60)
GFR calc non Af Amer: >60 mL/min (ref >60)
Glucose, Bld: 95 mg/dL (ref 65–99)
Potassium: 4.7 mmol/L (ref 3.5–5.1)
Sodium: 133 mmol/L — ABNORMAL LOW (ref 135–145)

## 2015-06-12 LAB — CBC
HEMATOCRIT: 38.6 % (ref 35.0–47.0)
Hemoglobin: 13.1 g/dL (ref 12.0–16.0)
MCH: 34 pg (ref 26.0–34.0)
MCHC: 34.1 g/dL (ref 32.0–36.0)
MCV: 99.9 fL (ref 80.0–100.0)
PLATELETS: 88 10*3/uL — AB (ref 150–440)
RBC: 3.86 MIL/uL (ref 3.80–5.20)
RDW: 17.6 % — AB (ref 11.5–14.5)
WBC: 3.5 10*3/uL — AB (ref 3.6–11.0)

## 2015-06-12 LAB — URINALYSIS COMPLETE WITH MICROSCOPIC (ARMC ONLY)
BACTERIA UA: NONE SEEN
SPECIFIC GRAVITY, URINE: 1.011 (ref 1.005–1.030)

## 2015-06-12 LAB — HEPATIC FUNCTION PANEL
ALT: 12 U/L — ABNORMAL LOW (ref 14–54)
AST: 13 U/L — ABNORMAL LOW (ref 15–41)
Albumin: 3.4 g/dL — ABNORMAL LOW (ref 3.5–5.0)
Alkaline Phosphatase: 80 U/L (ref 38–126)
Bilirubin, Direct: <0.1 mg/dL — ABNORMAL LOW (ref 0.1–0.5)
Total Bilirubin: 0.4 mg/dL (ref 0.3–1.2)
Total Protein: 7.1 g/dL (ref 6.5–8.1)

## 2015-06-12 LAB — LIPASE, BLOOD: Lipase: 18 U/L (ref 11–51)

## 2015-06-12 MED ORDER — ONDANSETRON HCL 4 MG/2ML IJ SOLN
4.0000 mg | Freq: Once | INTRAMUSCULAR | Status: AC
Start: 2015-06-12 — End: 2015-06-12
  Administered 2015-06-12: 4 mg via INTRAVENOUS
  Filled 2015-06-12: qty 2

## 2015-06-12 MED ORDER — SODIUM CHLORIDE 0.9 % IV BOLUS (SEPSIS)
1000.0000 mL | Freq: Once | INTRAVENOUS | Status: AC
  Administered 2015-06-12: 1000 mL via INTRAVENOUS

## 2015-06-12 MED ORDER — NITROFURANTOIN MACROCRYSTAL 100 MG PO CAPS
100.0000 mg | ORAL_CAPSULE | Freq: Two times a day (BID) | ORAL | Status: DC
Start: 2015-06-12 — End: 2015-06-30

## 2015-06-12 MED ORDER — ONDANSETRON 8 MG PO TBDP: 8.0000 mg | ORAL_TABLET | Freq: Three times a day (TID) | ORAL | Status: DC | PRN
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
## 2015-06-12 NOTE — Discharge Instructions (Signed)
Dehydration, Adult °Dehydration is a condition in which you do not have enough fluid or water in your body. It happens when you take in less fluid than you lose. Vital organs such as the kidneys, brain, and heart cannot function without a proper amount of fluids. Any loss of fluids from the body can cause dehydration.  °Dehydration can range from mild to severe. This condition should be treated right away to help prevent it from becoming severe. °CAUSES  °This condition may be caused by: °· Vomiting. °· Diarrhea. °· Excessive sweating, such as when exercising in hot or humid weather. °· Not drinking enough fluid during strenuous exercise or during an illness. °· Excessive urine output. °· Fever. °· Certain medicines. °RISK FACTORS °This condition is more likely to develop in: °· People who are taking certain medicines that cause the body to lose excess fluid (diuretics).   °· People who have a chronic illness, such as diabetes, that may increase urination. °· Older adults.   °· People who live at high altitudes.   °· People who participate in endurance sports.   °SYMPTOMS  °Mild Dehydration °· Thirst. °· Dry lips. °· Slightly dry mouth. °· Dry, warm skin. °Moderate Dehydration °· Very dry mouth.   °· Muscle cramps.   °· Dark urine and decreased urine production.   °· Decreased tear production.   °· Headache.   °· Light-headedness, especially when you stand up from a sitting position.   °Severe Dehydration °· Changes in skin.   °¨ Cold and clammy skin.   °¨ Skin does not spring back quickly when lightly pinched and released.   °· Changes in body fluids.   °¨ Extreme thirst.   °¨ No tears.   °¨ Not able to sweat when body temperature is high, such as in hot weather.   °¨ Minimal urine production.   °· Changes in vital signs.   °¨ Rapid, weak pulse (more than 100 beats per minute when you are sitting still).   °¨ Rapid breathing.   °¨ Low blood pressure.   °· Other changes.   °¨ Sunken eyes.   °¨ Cold hands and feet.    °¨ Confusion. °¨ Lethargy and difficulty being awakened. °¨ Fainting (syncope).   °¨ Short-term weight loss.   °¨ Unconsciousness. °DIAGNOSIS  °This condition may be diagnosed based on your symptoms. You may also have tests to determine how severe your dehydration is. These tests may include:  °· Urine tests.   °· Blood tests.   °TREATMENT  °Treatment for this condition depends on the severity. Mild or moderate dehydration can often be treated at home. Treatment should be started right away. Do not wait until dehydration becomes severe. Severe dehydration needs to be treated at the hospital. °Treatment for Mild Dehydration °· Drinking plenty of water to replace the fluid you have lost.   °· Replacing minerals in your blood (electrolytes) that you may have lost.   °Treatment for Moderate Dehydration  °· Consuming oral rehydration solution (ORS). °Treatment for Severe Dehydration °· Receiving fluid through an IV tube.   °· Receiving electrolyte solution through a feeding tube that is passed through your nose and into your stomach (nasogastric tube or NG tube). °· Correcting any abnormalities in electrolytes. °HOME CARE INSTRUCTIONS  °· Drink enough fluid to keep your urine clear or pale yellow.   °· Drink water or fluid slowly by taking small sips. You can also try sucking on ice cubes.  °· Have food or beverages that contain electrolytes. Examples include bananas and sports drinks. °· Take over-the-counter and prescription medicines only as told by your health care provider.   °· Prepare ORS according to the manufacturer's instructions. Take sips   of ORS every 5 minutes until your urine returns to normal.  If you have vomiting or diarrhea, continue to try to drink water, ORS, or both.   If you have diarrhea, avoid:   Beverages that contain caffeine.   Fruit juice.   Milk.   Carbonated soft drinks.  Do not take salt tablets. This can lead to the condition of having too much sodium in your body  (hypernatremia).  SEEK MEDICAL CARE IF:  You cannot eat or drink without vomiting.  You have had moderate diarrhea during a period of more than 24 hours.  You have a fever. SEEK IMMEDIATE MEDICAL CARE IF:   You have extreme thirst.  You have severe diarrhea.  You have not urinated in 6-8 hours, or you have urinated only a small amount of very dark urine.  You have shriveled skin.  You are dizzy, confused, or both.   This information is not intended to replace advice given to you by your health care provider. Make sure you discuss any questions you have with your health care provider.   Document Released: 07/29/2005 Document Revised: 04/19/2015 Document Reviewed: 12/14/2014 Elsevier Interactive Patient Education 2016 [employer].  Fatigue Fatigue is feeling tired all of the time, a lack of energy, or a lack of motivation. Occasional or mild fatigue is often a normal response to activity or life in general. However, long-lasting (chronic) or extreme fatigue may indicate an underlying medical condition. HOME CARE INSTRUCTIONS  Watch your fatigue for any changes. The following actions may help to lessen any discomfort you are feeling:  Talk to your health care provider about how much sleep you need each night. Try to get the required amount every night.  Take medicines only as directed by your health care provider.  Eat a healthy and nutritious diet. Ask your health care provider if you need help changing your diet.  Drink enough fluid to keep your urine clear or pale yellow.  Practice ways of relaxing, such as yoga, meditation, massage therapy, or acupuncture.  Exercise regularly.   Change situations that cause you stress. Try to keep your work and personal routine reasonable.  Do not abuse illegal drugs.  Limit alcohol intake to no more than 1 drink per day for nonpregnant women and 2 drinks per day for men. One drink equals 12 ounces of beer, 5 ounces of wine, or 1  ounces of hard liquor.  Take a multivitamin, if directed by your health care provider. SEEK MEDICAL CARE IF:   Your fatigue does not get better.  You have a fever.   You have unintentional weight loss or gain.  You have headaches.   You have difficulty:   Falling asleep.  Sleeping throughout the night.  You feel angry, guilty, anxious, or sad.   You are unable to have a bowel movement (constipation).   You skin is dry.   Your legs or another part of your body is swollen.  SEEK IMMEDIATE MEDICAL CARE IF:   You feel confused.   Your vision is blurry.  You feel faint or pass out.   You have a severe headache.   You have severe abdominal, pelvic, or back pain.   You have chest pain, shortness of breath, or an irregular or fast heartbeat.   You are unable to urinate or you urinate less than normal.   You develop abnormal bleeding, such as bleeding from the rectum, vagina, nose, lungs, or nipples.  You vomit blood.   You  have thoughts about harming yourself or committing suicide.   You are worried that you might harm someone else.    This information is not intended to replace advice given to you by your health care provider. Make sure you discuss any questions you have with your health care provider.   Document Released: 05/26/2007 Document Revised: 08/19/2014 Document Reviewed: 11/30/2013 Elsevier Interactive Patient Education 2016 Elsevier Inc.  Urinary Tract Infection Urinary tract infections (UTIs) can develop anywhere along your urinary tract. Your urinary tract is your body's drainage system for removing wastes and extra water. Your urinary tract includes two kidneys, two ureters, a bladder, and a urethra. Your kidneys are a pair of bean-shaped organs. Each kidney is about the size of your fist. They are located below your ribs, one on each side of your spine. CAUSES Infections are caused by microbes, which are microscopic organisms,  including fungi, viruses, and bacteria. These organisms are so small that they can only be seen through a microscope. Bacteria are the microbes that most commonly cause UTIs. SYMPTOMS  Symptoms of UTIs may vary by age and gender of the patient and by the location of the infection. Symptoms in young women typically include a frequent and intense urge to urinate and a painful, burning feeling in the bladder or urethra during urination. Older women and men are more likely to be tired, shaky, and weak and have muscle aches and abdominal pain. A fever may mean the infection is in your kidneys. Other symptoms of a kidney infection include pain in your back or sides below the ribs, nausea, and vomiting. DIAGNOSIS To diagnose a UTI, your caregiver will ask you about your symptoms. Your caregiver will also ask you to provide a urine sample. The urine sample will be tested for bacteria and white blood cells. White blood cells are made by your body to help fight infection. TREATMENT  Typically, UTIs can be treated with medication. Because most UTIs are caused by a bacterial infection, they usually can be treated with the use of antibiotics. The choice of antibiotic and length of treatment depend on your symptoms and the type of bacteria causing your infection. HOME CARE INSTRUCTIONS  If you were prescribed antibiotics, take them exactly as your caregiver instructs you. Finish the medication even if you feel better after you have only taken some of the medication.  Drink enough water and fluids to keep your urine clear or pale yellow.  Avoid caffeine, tea, and carbonated beverages. They tend to irritate your bladder.  Empty your bladder often. Avoid holding urine for long periods of time.  Empty your bladder before and after sexual intercourse.  After a bowel movement, women should cleanse from front to back. Use each tissue only once. SEEK MEDICAL CARE IF:   You have back pain.  You develop a  fever.  Your symptoms do not begin to resolve within 3 days. SEEK IMMEDIATE MEDICAL CARE IF:   You have severe back pain or lower abdominal pain.  You develop chills.  You have nausea or vomiting.  You have continued burning or discomfort with urination. MAKE SURE YOU:   Understand these instructions.  Will watch your condition.  Will get help right away if you are not doing well or get worse.   This information is not intended to replace advice given to you by your health care provider. Make sure you discuss any questions you have with your health care provider.   Document Released: 05/08/2005 Document Revised: 04/19/2015  Document Reviewed: 09/06/2011 Elsevier Interactive Patient Education [employer].

## 2015-06-12 NOTE — ED Notes (Signed)
Pt to ed with c/o weakness x several days,  Pt states she is currently being treated for bladder cancer.

## 2015-06-13 ENCOUNTER — Ambulatory Visit
Admission: RE | Admit: 2015-06-13 | Discharge: 2015-06-13 | Disposition: A | Discharge status: Home / Self Care | Payer: Medicare Other | Source: Ambulatory Visit | Attending: Radiation Oncology | Admitting: Radiation Oncology

## 2015-06-13 ENCOUNTER — Other Ambulatory Visit: Payer: Self-pay | Admitting: *Deleted

## 2015-06-13 ENCOUNTER — Ambulatory Visit: Payer: Medicare Other

## 2015-06-13 ENCOUNTER — Inpatient Hospital Stay: Payer: Medicare Other

## 2015-06-13 ENCOUNTER — Inpatient Hospital Stay: Payer: Medicare Other | Admitting: Family Medicine

## 2015-06-13 DIAGNOSIS — C679 Malignant neoplasm of bladder, unspecified: Secondary | ICD-10-CM

## 2015-06-14 ENCOUNTER — Ambulatory Visit
Admission: RE | Admit: 2015-06-14 | Discharge: 2015-06-14 | Disposition: A | Discharge status: Home / Self Care | Payer: Medicare Other | Source: Ambulatory Visit | Attending: Radiation Oncology | Admitting: Radiation Oncology

## 2015-06-14 ENCOUNTER — Ambulatory Visit: Payer: Medicare Other

## 2015-06-14 ENCOUNTER — Inpatient Hospital Stay: Payer: Medicare Other

## 2015-06-14 LAB — URINE CULTURE

## 2015-06-15 ENCOUNTER — Inpatient Hospital Stay: Payer: Medicare Other

## 2015-06-15 ENCOUNTER — Ambulatory Visit: Payer: Medicare Other

## 2015-06-15 ENCOUNTER — Ambulatory Visit
Admission: RE | Admit: 2015-06-15 | Discharge: 2015-06-15 | Disposition: A | Discharge status: Home / Self Care | Payer: Medicare Other | Source: Ambulatory Visit | Attending: Radiation Oncology | Admitting: Radiation Oncology

## 2015-06-16 ENCOUNTER — Ambulatory Visit: Payer: Medicare Other | Admitting: Family Medicine

## 2015-06-16 ENCOUNTER — Encounter: Payer: Self-pay | Admitting: Family Medicine

## 2015-06-16 ENCOUNTER — Inpatient Hospital Stay: Payer: Medicare Other | Attending: Family Medicine

## 2015-06-16 ENCOUNTER — Ambulatory Visit: Payer: Medicare Other

## 2015-06-16 ENCOUNTER — Inpatient Hospital Stay: Payer: Medicare Other

## 2015-06-16 ENCOUNTER — Inpatient Hospital Stay (HOSPITAL_BASED_OUTPATIENT_CLINIC_OR_DEPARTMENT_OTHER): Payer: Medicare Other | Admitting: Family Medicine

## 2015-06-16 ENCOUNTER — Ambulatory Visit
Admission: RE | Admit: 2015-06-16 | Discharge: 2015-06-16 | Disposition: A | Discharge status: Home / Self Care | Payer: Medicare Other | Source: Ambulatory Visit | Attending: Radiation Oncology | Admitting: Radiation Oncology

## 2015-06-16 ENCOUNTER — Other Ambulatory Visit: Payer: Medicare Other

## 2015-06-16 ENCOUNTER — Encounter: Payer: Self-pay | Admitting: Internal Medicine

## 2015-06-16 VITALS — BP 106/80 | HR 101 | Temp 96.5°F | Resp 18 | Wt 137.0 lb

## 2015-06-16 DIAGNOSIS — R300 Dysuria: Secondary | ICD-10-CM | POA: Insufficient documentation

## 2015-06-16 DIAGNOSIS — N3040 Irradiation cystitis without hematuria: Secondary | ICD-10-CM | POA: Insufficient documentation

## 2015-06-16 DIAGNOSIS — F17210 Nicotine dependence, cigarettes, uncomplicated: Secondary | ICD-10-CM | POA: Insufficient documentation

## 2015-06-16 DIAGNOSIS — I129 Hypertensive chronic kidney disease with stage 1 through stage 4 chronic kidney disease, or unspecified chronic kidney disease: Secondary | ICD-10-CM | POA: Insufficient documentation

## 2015-06-16 DIAGNOSIS — J449 Chronic obstructive pulmonary disease, unspecified: Secondary | ICD-10-CM

## 2015-06-16 DIAGNOSIS — K219 Gastro-esophageal reflux disease without esophagitis: Secondary | ICD-10-CM | POA: Insufficient documentation

## 2015-06-16 DIAGNOSIS — Z9181 History of falling: Secondary | ICD-10-CM | POA: Diagnosis not present

## 2015-06-16 DIAGNOSIS — C679 Malignant neoplasm of bladder, unspecified: Secondary | ICD-10-CM

## 2015-06-16 DIAGNOSIS — R0602 Shortness of breath: Secondary | ICD-10-CM | POA: Diagnosis not present

## 2015-06-16 DIAGNOSIS — R112 Nausea with vomiting, unspecified: Secondary | ICD-10-CM

## 2015-06-16 DIAGNOSIS — B373 Candidiasis of vulva and vagina: Secondary | ICD-10-CM | POA: Insufficient documentation

## 2015-06-16 DIAGNOSIS — N390 Urinary tract infection, site not specified: Secondary | ICD-10-CM | POA: Diagnosis not present

## 2015-06-16 DIAGNOSIS — J45909 Unspecified asthma, uncomplicated: Secondary | ICD-10-CM | POA: Diagnosis not present

## 2015-06-16 DIAGNOSIS — E876 Hypokalemia: Secondary | ICD-10-CM | POA: Insufficient documentation

## 2015-06-16 DIAGNOSIS — D72829 Elevated white blood cell count, unspecified: Secondary | ICD-10-CM | POA: Diagnosis not present

## 2015-06-16 DIAGNOSIS — F419 Anxiety disorder, unspecified: Secondary | ICD-10-CM | POA: Diagnosis not present

## 2015-06-16 DIAGNOSIS — R5381 Other malaise: Secondary | ICD-10-CM | POA: Insufficient documentation

## 2015-06-16 DIAGNOSIS — Z7952 Long term (current) use of systemic steroids: Secondary | ICD-10-CM | POA: Diagnosis not present

## 2015-06-16 DIAGNOSIS — M549 Dorsalgia, unspecified: Secondary | ICD-10-CM | POA: Insufficient documentation

## 2015-06-16 DIAGNOSIS — G8929 Other chronic pain: Secondary | ICD-10-CM | POA: Diagnosis not present

## 2015-06-16 DIAGNOSIS — K520 Gastroenteritis and colitis due to radiation: Secondary | ICD-10-CM | POA: Insufficient documentation

## 2015-06-16 DIAGNOSIS — R42 Dizziness and giddiness: Secondary | ICD-10-CM

## 2015-06-16 DIAGNOSIS — E871 Hypo-osmolality and hyponatremia: Secondary | ICD-10-CM | POA: Diagnosis not present

## 2015-06-16 DIAGNOSIS — Z79899 Other long term (current) drug therapy: Secondary | ICD-10-CM | POA: Diagnosis not present

## 2015-06-16 DIAGNOSIS — R05 Cough: Secondary | ICD-10-CM | POA: Insufficient documentation

## 2015-06-16 DIAGNOSIS — Z9981 Dependence on supplemental oxygen: Secondary | ICD-10-CM | POA: Insufficient documentation

## 2015-06-16 DIAGNOSIS — R5383 Other fatigue: Secondary | ICD-10-CM | POA: Diagnosis not present

## 2015-06-16 DIAGNOSIS — M797 Fibromyalgia: Secondary | ICD-10-CM | POA: Insufficient documentation

## 2015-06-16 DIAGNOSIS — N189 Chronic kidney disease, unspecified: Secondary | ICD-10-CM | POA: Insufficient documentation

## 2015-06-16 DIAGNOSIS — Z5111 Encounter for antineoplastic chemotherapy: Secondary | ICD-10-CM | POA: Diagnosis not present

## 2015-06-16 DIAGNOSIS — R531 Weakness: Secondary | ICD-10-CM | POA: Insufficient documentation

## 2015-06-16 DIAGNOSIS — D751 Secondary polycythemia: Secondary | ICD-10-CM | POA: Diagnosis not present

## 2015-06-16 DIAGNOSIS — R410 Disorientation, unspecified: Secondary | ICD-10-CM

## 2015-06-16 HISTORY — DX: Dizziness and giddiness: R42

## 2015-06-16 HISTORY — DX: Disorientation, unspecified: R41.0

## 2015-06-16 LAB — COMPREHENSIVE METABOLIC PANEL
ALK PHOS: 80 U/L (ref 38–126)
ALT: 9 U/L — ABNORMAL LOW (ref 14–54)
ANION GAP: 8 (ref 5–15)
AST: 16 U/L (ref 15–41)
Albumin: 3.6 g/dL (ref 3.5–5.0)
BILIRUBIN TOTAL: 0.5 mg/dL (ref 0.3–1.2)
BUN: 18 mg/dL (ref 6–20)
CALCIUM: 8.9 mg/dL (ref 8.9–10.3)
CO2: 36 mmol/L — ABNORMAL HIGH (ref 22–32)
Chloride: 84 mmol/L — ABNORMAL LOW (ref 101–111)
Creatinine, Ser: 0.88 mg/dL (ref 0.44–1.00)
GFR calc non Af Amer: >60 mL/min (ref >60)
GLUCOSE: 106 mg/dL — AB (ref 65–99)
Potassium: 3.2 mmol/L — ABNORMAL LOW (ref 3.5–5.1)
Sodium: 128 mmol/L — ABNORMAL LOW (ref 135–145)
TOTAL PROTEIN: 7.2 g/dL (ref 6.5–8.1)

## 2015-06-16 LAB — CBC WITH DIFFERENTIAL/PLATELET
Basophils Absolute: 0 10*3/uL (ref 0–0.1)
Basophils Relative: 0 %
Eosinophils Absolute: 0.1 10*3/uL (ref 0–0.7)
Eosinophils Relative: 1 %
HEMATOCRIT: 41.4 % (ref 35.0–47.0)
HEMOGLOBIN: 14 g/dL (ref 12.0–16.0)
LYMPHS ABS: 1.2 10*3/uL (ref 1.0–3.6)
Lymphocytes Relative: 31 %
MCH: 33.4 pg (ref 26.0–34.0)
MCHC: 33.9 g/dL (ref 32.0–36.0)
MCV: 98.4 fL (ref 80.0–100.0)
MONOS PCT: 17 %
Monocytes Absolute: 0.7 10*3/uL (ref 0.2–0.9)
NEUTROS ABS: 2 10*3/uL (ref 1.4–6.5)
NEUTROS PCT: 51 %
Platelets: 110 10*3/uL — ABNORMAL LOW (ref 150–440)
RBC: 4.21 MIL/uL (ref 3.80–5.20)
RDW: 18.1 % — ABNORMAL HIGH (ref 11.5–14.5)
WBC: 4 10*3/uL (ref 3.6–11.0)

## 2015-06-16 MED ORDER — SODIUM CHLORIDE 0.9 % IV SOLN
173.8000 mg | Freq: Once | INTRAVENOUS | Status: AC
  Administered 2015-06-16: 170 mg via INTRAVENOUS
  Filled 2015-06-16: qty 17

## 2015-06-16 MED ORDER — HEPARIN SOD (PORK) LOCK FLUSH 100 UNIT/ML IV SOLN
500.0000 [IU] | Freq: Once | INTRAVENOUS | Status: AC
  Administered 2015-06-16: 500 [IU] via INTRAVENOUS
  Filled 2015-06-16: qty 5

## 2015-06-16 MED ORDER — SODIUM CHLORIDE 0.9 % IV SOLN
Freq: Once | INTRAVENOUS | Status: AC
  Administered 2015-06-16: 13:00:00 via INTRAVENOUS
  Filled 2015-06-16: qty 4

## 2015-06-16 MED ORDER — SODIUM CHLORIDE 0.9 % IJ SOLN
10.0000 mL | Freq: Once | INTRAMUSCULAR | Status: AC
  Administered 2015-06-16: 10 mL via INTRAVENOUS
  Filled 2015-06-16: qty 10

## 2015-06-16 MED ORDER — SODIUM CHLORIDE 0.9 % IV SOLN
Freq: Once | INTRAVENOUS | Status: AC
  Administered 2015-06-16: 13:00:00 via INTRAVENOUS
  Filled 2015-06-16: qty 1000

## 2015-06-16 MED ORDER — PREDNISONE 5 MG PO TABS: 5.0000 mg | ORAL_TABLET | Freq: Two times a day (BID) | ORAL | Status: DC
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
## 2015-06-19 ENCOUNTER — Inpatient Hospital Stay: Payer: Medicare Other

## 2015-06-19 ENCOUNTER — Ambulatory Visit
Admission: RE | Admit: 2015-06-19 | Discharge: 2015-06-19 | Disposition: A | Discharge status: Home / Self Care | Payer: Medicare Other | Source: Ambulatory Visit | Attending: Radiation Oncology | Admitting: Radiation Oncology

## 2015-06-19 DIAGNOSIS — Z510 Encounter for antineoplastic radiation therapy: Secondary | ICD-10-CM | POA: Diagnosis not present

## 2015-06-20 ENCOUNTER — Inpatient Hospital Stay: Payer: Medicare Other

## 2015-06-20 ENCOUNTER — Inpatient Hospital Stay: Payer: Medicare Other | Admitting: Family Medicine

## 2015-06-20 ENCOUNTER — Ambulatory Visit
Admission: RE | Admit: 2015-06-20 | Discharge: 2015-06-20 | Disposition: A | Discharge status: Home / Self Care | Payer: Medicare Other | Source: Ambulatory Visit | Attending: Family Medicine | Admitting: Family Medicine

## 2015-06-20 ENCOUNTER — Other Ambulatory Visit: Payer: Self-pay

## 2015-06-20 ENCOUNTER — Ambulatory Visit
Admission: RE | Admit: 2015-06-20 | Discharge: 2015-06-20 | Disposition: A | Discharge status: Home / Self Care | Payer: Medicare Other | Source: Ambulatory Visit | Attending: Radiation Oncology | Admitting: Radiation Oncology

## 2015-06-20 DIAGNOSIS — C679 Malignant neoplasm of bladder, unspecified: Secondary | ICD-10-CM

## 2015-06-20 DIAGNOSIS — Z510 Encounter for antineoplastic radiation therapy: Secondary | ICD-10-CM | POA: Diagnosis not present

## 2015-06-20 DIAGNOSIS — R42 Dizziness and giddiness: Secondary | ICD-10-CM | POA: Diagnosis present

## 2015-06-20 DIAGNOSIS — R410 Disorientation, unspecified: Secondary | ICD-10-CM | POA: Diagnosis present

## 2015-06-21 ENCOUNTER — Ambulatory Visit
Admission: RE | Admit: 2015-06-21 | Discharge: 2015-06-21 | Disposition: A | Discharge status: Home / Self Care | Payer: Medicare Other | Source: Ambulatory Visit | Attending: Radiation Oncology | Admitting: Radiation Oncology

## 2015-06-21 DIAGNOSIS — Z510 Encounter for antineoplastic radiation therapy: Secondary | ICD-10-CM | POA: Diagnosis not present

## 2015-06-22 ENCOUNTER — Ambulatory Visit: Payer: Medicare Other

## 2015-06-22 DIAGNOSIS — Z510 Encounter for antineoplastic radiation therapy: Secondary | ICD-10-CM | POA: Diagnosis not present

## 2015-06-23 ENCOUNTER — Inpatient Hospital Stay (HOSPITAL_BASED_OUTPATIENT_CLINIC_OR_DEPARTMENT_OTHER): Payer: Medicare Other | Admitting: Hematology and Oncology

## 2015-06-23 ENCOUNTER — Inpatient Hospital Stay: Payer: Medicare Other

## 2015-06-23 ENCOUNTER — Ambulatory Visit
Admission: RE | Admit: 2015-06-23 | Discharge: 2015-06-23 | Disposition: A | Discharge status: Home / Self Care | Payer: Medicare Other | Source: Ambulatory Visit | Attending: Radiation Oncology | Admitting: Radiation Oncology

## 2015-06-23 ENCOUNTER — Encounter: Payer: Self-pay | Admitting: Hematology and Oncology

## 2015-06-23 ENCOUNTER — Other Ambulatory Visit: Payer: Self-pay | Admitting: Internal Medicine

## 2015-06-23 VITALS — BP 129/74 | HR 78 | Temp 96.4°F | Wt 149.0 lb

## 2015-06-23 DIAGNOSIS — R5383 Other fatigue: Secondary | ICD-10-CM

## 2015-06-23 DIAGNOSIS — N3040 Irradiation cystitis without hematuria: Secondary | ICD-10-CM | POA: Diagnosis not present

## 2015-06-23 DIAGNOSIS — F17210 Nicotine dependence, cigarettes, uncomplicated: Secondary | ICD-10-CM

## 2015-06-23 DIAGNOSIS — E871 Hypo-osmolality and hyponatremia: Secondary | ICD-10-CM

## 2015-06-23 DIAGNOSIS — Z7952 Long term (current) use of systemic steroids: Secondary | ICD-10-CM

## 2015-06-23 DIAGNOSIS — N189 Chronic kidney disease, unspecified: Secondary | ICD-10-CM

## 2015-06-23 DIAGNOSIS — C679 Malignant neoplasm of bladder, unspecified: Secondary | ICD-10-CM

## 2015-06-23 DIAGNOSIS — E876 Hypokalemia: Secondary | ICD-10-CM | POA: Diagnosis not present

## 2015-06-23 DIAGNOSIS — I129 Hypertensive chronic kidney disease with stage 1 through stage 4 chronic kidney disease, or unspecified chronic kidney disease: Secondary | ICD-10-CM

## 2015-06-23 DIAGNOSIS — D751 Secondary polycythemia: Secondary | ICD-10-CM

## 2015-06-23 DIAGNOSIS — D72829 Elevated white blood cell count, unspecified: Secondary | ICD-10-CM

## 2015-06-23 DIAGNOSIS — Z5111 Encounter for antineoplastic chemotherapy: Secondary | ICD-10-CM | POA: Diagnosis not present

## 2015-06-23 DIAGNOSIS — Z510 Encounter for antineoplastic radiation therapy: Secondary | ICD-10-CM | POA: Diagnosis not present

## 2015-06-23 LAB — CBC WITH DIFFERENTIAL/PLATELET
Basophils Absolute: 0 10*3/uL (ref 0–0.1)
Basophils Relative: 0 %
Eosinophils Absolute: 0 10*3/uL (ref 0–0.7)
Eosinophils Relative: 1 %
HCT: 37.3 % (ref 35.0–47.0)
Hemoglobin: 12.7 g/dL (ref 12.0–16.0)
Lymphocytes Relative: 21 %
Lymphs Abs: 1 10*3/uL (ref 1.0–3.6)
MCH: 34 pg (ref 26.0–34.0)
MCHC: 33.9 g/dL (ref 32.0–36.0)
MCV: 100.1 fL — ABNORMAL HIGH (ref 80.0–100.0)
Monocytes Absolute: 0.5 10*3/uL (ref 0.2–0.9)
Monocytes Relative: 11 %
Neutro Abs: 3.3 10*3/uL (ref 1.4–6.5)
Neutrophils Relative %: 67 %
Platelets: 129 10*3/uL — ABNORMAL LOW (ref 150–440)
RBC: 3.72 MIL/uL — ABNORMAL LOW (ref 3.80–5.20)
RDW: 18.4 % — ABNORMAL HIGH (ref 11.5–14.5)
WBC: 5 10*3/uL (ref 3.6–11.0)

## 2015-06-23 LAB — COMPREHENSIVE METABOLIC PANEL
ALT: 10 U/L — ABNORMAL LOW (ref 14–54)
AST: 16 U/L (ref 15–41)
Albumin: 3.3 g/dL — ABNORMAL LOW (ref 3.5–5.0)
Alkaline Phosphatase: 54 U/L (ref 38–126)
Anion gap: 7 (ref 5–15)
BUN: 17 mg/dL (ref 6–20)
CO2: 31 mmol/L (ref 22–32)
Calcium: 8.8 mg/dL — ABNORMAL LOW (ref 8.9–10.3)
Chloride: 92 mmol/L — ABNORMAL LOW (ref 101–111)
Creatinine, Ser: 0.6 mg/dL (ref 0.44–1.00)
GFR calc Af Amer: >60 mL/min (ref >60)
GFR calc non Af Amer: >60 mL/min (ref >60)
Glucose, Bld: 93 mg/dL (ref 65–99)
Potassium: 3.4 mmol/L — ABNORMAL LOW (ref 3.5–5.1)
Sodium: 130 mmol/L — ABNORMAL LOW (ref 135–145)
Total Bilirubin: 0.5 mg/dL (ref 0.3–1.2)
Total Protein: 6.5 g/dL (ref 6.5–8.1)

## 2015-06-23 LAB — MAGNESIUM: Magnesium: 1.5 mg/dL — ABNORMAL LOW (ref 1.7–2.4)

## 2015-06-23 MED ORDER — HEPARIN SOD (PORK) LOCK FLUSH 100 UNIT/ML IV SOLN
INTRAVENOUS | Status: AC
  Filled 2015-06-23: qty 5

## 2015-06-23 MED ORDER — HEPARIN SOD (PORK) LOCK FLUSH 100 UNIT/ML IV SOLN
500.0000 [IU] | Freq: Once | INTRAVENOUS | Status: AC
  Administered 2015-06-23: 500 [IU] via INTRAVENOUS

## 2015-06-23 MED ORDER — SODIUM CHLORIDE 0.9 % IV SOLN
Freq: Once | INTRAVENOUS | Status: AC
  Administered 2015-06-23: 14:00:00 via INTRAVENOUS
  Filled 2015-06-23: qty 4

## 2015-06-23 MED ORDER — MAGNESIUM SULFATE 50 % IJ SOLN
1.0000 g | Freq: Once | INTRAVENOUS | Status: DC
  Filled 2015-06-23: qty 2

## 2015-06-23 MED ORDER — SODIUM CHLORIDE 0.9 % IV SOLN
173.8000 mg | Freq: Once | INTRAVENOUS | Status: AC
  Administered 2015-06-23: 170 mg via INTRAVENOUS
  Filled 2015-06-23: qty 17

## 2015-06-23 MED ORDER — SODIUM CHLORIDE 0.9 % IJ SOLN
10.0000 mL | INTRAMUSCULAR | Status: DC | PRN
  Administered 2015-06-23: 10 mL via INTRAVENOUS
  Filled 2015-06-23: qty 10

## 2015-06-23 MED ORDER — SODIUM CHLORIDE 0.9 % IV SOLN
Freq: Once | INTRAVENOUS | Status: AC
Start: 2015-06-23 — End: 2015-06-23
  Administered 2015-06-23: 13:00:00 via INTRAVENOUS
  Filled 2015-06-23: qty 1000

## 2015-06-23 MED ORDER — SODIUM CHLORIDE 0.9 % IV SOLN
1.0000 g | Freq: Once | INTRAVENOUS | Status: AC
  Administered 2015-06-23: 1 g via INTRAVENOUS
  Filled 2015-06-23: qty 2
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
## 2015-06-25 ENCOUNTER — Other Ambulatory Visit: Payer: Self-pay | Admitting: Hematology and Oncology

## 2015-06-25 DIAGNOSIS — C679 Malignant neoplasm of bladder, unspecified: Secondary | ICD-10-CM

## 2015-06-25 MED ORDER — HYOSCYAMINE SULFATE 0.125 MG PO TABS: 0.1250 mg | ORAL_TABLET | ORAL | Status: DC | PRN

## 2015-06-26 ENCOUNTER — Ambulatory Visit: Payer: Medicare Other

## 2015-06-26 DIAGNOSIS — Z510 Encounter for antineoplastic radiation therapy: Secondary | ICD-10-CM | POA: Diagnosis not present

## 2015-06-27 ENCOUNTER — Ambulatory Visit
Admission: RE | Admit: 2015-06-27 | Discharge: 2015-06-27 | Disposition: A | Discharge status: Home / Self Care | Payer: Medicare Other | Source: Ambulatory Visit | Attending: Radiation Oncology | Admitting: Radiation Oncology

## 2015-06-27 ENCOUNTER — Ambulatory Visit: Payer: Medicare Other

## 2015-06-28 ENCOUNTER — Ambulatory Visit
Admission: RE | Admit: 2015-06-28 | Discharge: 2015-06-28 | Disposition: A | Discharge status: Home / Self Care | Payer: Medicare Other | Source: Ambulatory Visit | Attending: Radiation Oncology | Admitting: Radiation Oncology

## 2015-06-28 DIAGNOSIS — Z510 Encounter for antineoplastic radiation therapy: Secondary | ICD-10-CM | POA: Diagnosis not present

## 2015-06-29 ENCOUNTER — Ambulatory Visit: Payer: Medicare Other

## 2015-06-29 ENCOUNTER — Ambulatory Visit
Admission: RE | Admit: 2015-06-29 | Discharge: 2015-06-29 | Disposition: A | Discharge status: Home / Self Care | Payer: Medicare Other | Source: Ambulatory Visit | Attending: Radiation Oncology | Admitting: Radiation Oncology

## 2015-06-29 DIAGNOSIS — Z510 Encounter for antineoplastic radiation therapy: Secondary | ICD-10-CM | POA: Diagnosis not present

## 2015-06-30 ENCOUNTER — Inpatient Hospital Stay (HOSPITAL_BASED_OUTPATIENT_CLINIC_OR_DEPARTMENT_OTHER): Payer: Medicare Other | Admitting: Internal Medicine

## 2015-06-30 ENCOUNTER — Inpatient Hospital Stay: Payer: Medicare Other

## 2015-06-30 ENCOUNTER — Encounter: Payer: Self-pay | Admitting: Internal Medicine

## 2015-06-30 ENCOUNTER — Ambulatory Visit
Admission: RE | Admit: 2015-06-30 | Discharge: 2015-06-30 | Disposition: A | Discharge status: Home / Self Care | Payer: Medicare Other | Source: Ambulatory Visit | Attending: Radiation Oncology | Admitting: Radiation Oncology

## 2015-06-30 ENCOUNTER — Other Ambulatory Visit: Payer: Self-pay | Admitting: *Deleted

## 2015-06-30 ENCOUNTER — Encounter: Payer: Self-pay | Admitting: Hematology and Oncology

## 2015-06-30 ENCOUNTER — Other Ambulatory Visit: Payer: Self-pay | Admitting: Hematology and Oncology

## 2015-06-30 DIAGNOSIS — J45909 Unspecified asthma, uncomplicated: Secondary | ICD-10-CM

## 2015-06-30 DIAGNOSIS — C679 Malignant neoplasm of bladder, unspecified: Secondary | ICD-10-CM

## 2015-06-30 DIAGNOSIS — D751 Secondary polycythemia: Secondary | ICD-10-CM

## 2015-06-30 DIAGNOSIS — Z5111 Encounter for antineoplastic chemotherapy: Secondary | ICD-10-CM | POA: Diagnosis not present

## 2015-06-30 DIAGNOSIS — Z9981 Dependence on supplemental oxygen: Secondary | ICD-10-CM

## 2015-06-30 DIAGNOSIS — B373 Candidiasis of vulva and vagina: Secondary | ICD-10-CM

## 2015-06-30 DIAGNOSIS — R300 Dysuria: Secondary | ICD-10-CM

## 2015-06-30 DIAGNOSIS — J449 Chronic obstructive pulmonary disease, unspecified: Secondary | ICD-10-CM

## 2015-06-30 DIAGNOSIS — F17210 Nicotine dependence, cigarettes, uncomplicated: Secondary | ICD-10-CM

## 2015-06-30 DIAGNOSIS — R05 Cough: Secondary | ICD-10-CM

## 2015-06-30 DIAGNOSIS — K520 Gastroenteritis and colitis due to radiation: Secondary | ICD-10-CM | POA: Diagnosis not present

## 2015-06-30 DIAGNOSIS — D72829 Elevated white blood cell count, unspecified: Secondary | ICD-10-CM

## 2015-06-30 DIAGNOSIS — Z510 Encounter for antineoplastic radiation therapy: Secondary | ICD-10-CM | POA: Diagnosis not present

## 2015-06-30 LAB — CBC WITH DIFFERENTIAL/PLATELET
Basophils Absolute: 0 10*3/uL (ref 0–0.1)
Basophils Relative: 0 %
Eosinophils Absolute: 0 10*3/uL (ref 0–0.7)
Eosinophils Relative: 1 %
HCT: 34.8 % — ABNORMAL LOW (ref 35.0–47.0)
Hemoglobin: 11.8 g/dL — ABNORMAL LOW (ref 12.0–16.0)
Lymphocytes Relative: 23 %
Lymphs Abs: 0.6 10*3/uL — ABNORMAL LOW (ref 1.0–3.6)
MCH: 34 pg (ref 26.0–34.0)
MCHC: 33.9 g/dL (ref 32.0–36.0)
MCV: 100.3 fL — ABNORMAL HIGH (ref 80.0–100.0)
Monocytes Absolute: 0.5 10*3/uL (ref 0.2–0.9)
Monocytes Relative: 17 %
Neutro Abs: 1.6 10*3/uL (ref 1.4–6.5)
Neutrophils Relative %: 59 %
Platelets: 121 10*3/uL — ABNORMAL LOW (ref 150–440)
RBC: 3.47 MIL/uL — ABNORMAL LOW (ref 3.80–5.20)
RDW: 18.8 % — ABNORMAL HIGH (ref 11.5–14.5)
WBC: 2.8 10*3/uL — ABNORMAL LOW (ref 3.6–11.0)

## 2015-06-30 LAB — URINALYSIS COMPLETE WITH MICROSCOPIC (ARMC ONLY)
BILIRUBIN URINE: NEGATIVE
Glucose, UA: NEGATIVE mg/dL
HGB URINE DIPSTICK: NEGATIVE
NITRITE: NEGATIVE
PH: 6 (ref 5.0–8.0)
Protein, ur: 100 mg/dL — AB
SPECIFIC GRAVITY, URINE: 1.025 (ref 1.005–1.030)

## 2015-06-30 LAB — COMPREHENSIVE METABOLIC PANEL
ALT: 9 U/L — ABNORMAL LOW (ref 14–54)
AST: 14 U/L — ABNORMAL LOW (ref 15–41)
Albumin: 3 g/dL — ABNORMAL LOW (ref 3.5–5.0)
Alkaline Phosphatase: 59 U/L (ref 38–126)
Anion gap: 4 — ABNORMAL LOW (ref 5–15)
BUN: 14 mg/dL (ref 6–20)
CO2: 38 mmol/L — ABNORMAL HIGH (ref 22–32)
Calcium: 8.8 mg/dL — ABNORMAL LOW (ref 8.9–10.3)
Chloride: 89 mmol/L — ABNORMAL LOW (ref 101–111)
Creatinine, Ser: 0.63 mg/dL (ref 0.44–1.00)
GFR calc Af Amer: >60 mL/min (ref >60)
GFR calc non Af Amer: >60 mL/min (ref >60)
Glucose, Bld: 105 mg/dL — ABNORMAL HIGH (ref 65–99)
Potassium: 4 mmol/L (ref 3.5–5.1)
Sodium: 131 mmol/L — ABNORMAL LOW (ref 135–145)
Total Bilirubin: 0.5 mg/dL (ref 0.3–1.2)
Total Protein: 6.2 g/dL — ABNORMAL LOW (ref 6.5–8.1)

## 2015-06-30 LAB — MAGNESIUM: Magnesium: 1.3 mg/dL — ABNORMAL LOW (ref 1.7–2.4)

## 2015-06-30 MED ORDER — SODIUM CHLORIDE 0.9 % IV SOLN
Freq: Once | INTRAVENOUS | Status: AC
  Administered 2015-06-30: 12:00:00 via INTRAVENOUS
  Filled 2015-06-30: qty 1000

## 2015-06-30 MED ORDER — FLUCONAZOLE 100 MG PO TABS: 100.0000 mg | ORAL_TABLET | Freq: Every day | ORAL | Status: DC

## 2015-06-30 MED ORDER — SODIUM CHLORIDE 0.9 % IV SOLN
173.8000 mg | Freq: Once | INTRAVENOUS | Status: AC
  Administered 2015-06-30: 170 mg via INTRAVENOUS
  Filled 2015-06-30: qty 17

## 2015-06-30 MED ORDER — MAGNESIUM SULFATE 4 GM/100ML IV SOLN
4.0000 g | Freq: Once | INTRAVENOUS | Status: AC
  Administered 2015-06-30: 4 g via INTRAVENOUS
  Filled 2015-06-30: qty 100

## 2015-06-30 MED ORDER — SODIUM CHLORIDE 0.9 % IJ SOLN
10.0000 mL | INTRAMUSCULAR | Status: DC | PRN
  Administered 2015-06-30: 10 mL via INTRAVENOUS
  Filled 2015-06-30: qty 10

## 2015-06-30 MED ORDER — HEPARIN SOD (PORK) LOCK FLUSH 100 UNIT/ML IV SOLN
500.0000 [IU] | Freq: Once | INTRAVENOUS | Status: AC
  Administered 2015-06-30: 500 [IU] via INTRAVENOUS
  Filled 2015-06-30: qty 5

## 2015-06-30 MED ORDER — CIPROFLOXACIN HCL 500 MG PO TABS: 500.0000 mg | ORAL_TABLET | Freq: Two times a day (BID) | ORAL | Status: DC

## 2015-06-30 MED ORDER — DEXAMETHASONE SODIUM PHOSPHATE 100 MG/10ML IJ SOLN
Freq: Once | INTRAMUSCULAR | Status: AC
  Administered 2015-06-30: 14:00:00 via INTRAVENOUS
  Filled 2015-06-30: qty 4
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
## 2015-07-03 ENCOUNTER — Inpatient Hospital Stay: Payer: Medicare Other

## 2015-07-03 ENCOUNTER — Ambulatory Visit
Admission: RE | Admit: 2015-07-03 | Discharge: 2015-07-03 | Disposition: A | Discharge status: Home / Self Care | Payer: Medicare Other | Source: Ambulatory Visit | Attending: Radiation Oncology | Admitting: Radiation Oncology

## 2015-07-03 ENCOUNTER — Telehealth: Payer: Self-pay | Admitting: *Deleted

## 2015-07-03 DIAGNOSIS — Z510 Encounter for antineoplastic radiation therapy: Secondary | ICD-10-CM | POA: Diagnosis not present

## 2015-07-03 NOTE — Telephone Encounter (Signed)
Cystoscopy was due for 11/30 and it needed to be changed because pt was put on hold with chemo and radiation. She started back on later part of the week last week.  We changed cystoscopy to 12/30 at 9:30.  Also pt wanted me to call pulmonary and get another appt.  She had on 10/29 she thought and she missed it and wanted it r/s  I called and got another appt 12/27 11:40 with Dr. Chase Caller.  Husband aware of both appt.

## 2015-07-04 ENCOUNTER — Ambulatory Visit
Admission: RE | Admit: 2015-07-04 | Discharge: 2015-07-04 | Disposition: A | Discharge status: Home / Self Care | Payer: Medicare Other | Source: Ambulatory Visit | Attending: Radiation Oncology | Admitting: Radiation Oncology

## 2015-07-04 ENCOUNTER — Other Ambulatory Visit: Payer: Self-pay | Admitting: *Deleted

## 2015-07-04 ENCOUNTER — Inpatient Hospital Stay: Payer: Medicare Other

## 2015-07-04 DIAGNOSIS — Z510 Encounter for antineoplastic radiation therapy: Secondary | ICD-10-CM | POA: Diagnosis not present

## 2015-07-04 MED ORDER — FIRST-DUKES MOUTHWASH MT SUSP: 5.0000 mL | Freq: Four times a day (QID) | OROMUCOSAL | Status: DC | PRN

## 2015-07-05 ENCOUNTER — Ambulatory Visit
Admission: RE | Admit: 2015-07-05 | Discharge: 2015-07-05 | Disposition: A | Discharge status: Home / Self Care | Payer: Medicare Other | Source: Ambulatory Visit | Attending: Radiation Oncology | Admitting: Radiation Oncology

## 2015-07-05 ENCOUNTER — Inpatient Hospital Stay: Payer: Medicare Other

## 2015-07-05 DIAGNOSIS — Z510 Encounter for antineoplastic radiation therapy: Secondary | ICD-10-CM | POA: Diagnosis present

## 2015-07-05 DIAGNOSIS — C679 Malignant neoplasm of bladder, unspecified: Secondary | ICD-10-CM | POA: Diagnosis not present

## 2015-07-07 ENCOUNTER — Encounter: Payer: Self-pay | Admitting: Emergency Medicine

## 2015-07-07 ENCOUNTER — Emergency Department
Admission: EM | Admit: 2015-07-07 | Discharge: 2015-07-07 | Disposition: A | Discharge status: Home / Self Care | Payer: Medicare Other | Source: Home / Self Care | Attending: Emergency Medicine | Admitting: Emergency Medicine

## 2015-07-07 DIAGNOSIS — R5383 Other fatigue: Secondary | ICD-10-CM

## 2015-07-07 DIAGNOSIS — Z79899 Other long term (current) drug therapy: Secondary | ICD-10-CM

## 2015-07-07 DIAGNOSIS — N3040 Irradiation cystitis without hematuria: Secondary | ICD-10-CM | POA: Diagnosis not present

## 2015-07-07 DIAGNOSIS — Z792 Long term (current) use of antibiotics: Secondary | ICD-10-CM | POA: Insufficient documentation

## 2015-07-07 DIAGNOSIS — N390 Urinary tract infection, site not specified: Secondary | ICD-10-CM | POA: Insufficient documentation

## 2015-07-07 DIAGNOSIS — F17210 Nicotine dependence, cigarettes, uncomplicated: Secondary | ICD-10-CM

## 2015-07-07 DIAGNOSIS — I129 Hypertensive chronic kidney disease with stage 1 through stage 4 chronic kidney disease, or unspecified chronic kidney disease: Secondary | ICD-10-CM

## 2015-07-07 DIAGNOSIS — R1030 Lower abdominal pain, unspecified: Secondary | ICD-10-CM | POA: Diagnosis not present

## 2015-07-07 DIAGNOSIS — N189 Chronic kidney disease, unspecified: Secondary | ICD-10-CM | POA: Insufficient documentation

## 2015-07-07 DIAGNOSIS — Z7952 Long term (current) use of systemic steroids: Secondary | ICD-10-CM | POA: Insufficient documentation

## 2015-07-07 LAB — CBC WITH DIFFERENTIAL/PLATELET
BASOS ABS: 0 10*3/uL (ref 0–0.1)
Basophils Relative: 0 %
Eosinophils Absolute: 0 10*3/uL (ref 0–0.7)
HEMATOCRIT: 35.4 % (ref 35.0–47.0)
Hemoglobin: 11.9 g/dL — ABNORMAL LOW (ref 12.0–16.0)
Lymphs Abs: 0.3 10*3/uL — ABNORMAL LOW (ref 1.0–3.6)
MCH: 34.4 pg — AB (ref 26.0–34.0)
MCHC: 33.5 g/dL (ref 32.0–36.0)
MCV: 102.6 fL — AB (ref 80.0–100.0)
MONO ABS: 0.3 10*3/uL (ref 0.2–0.9)
Neutro Abs: 2.2 10*3/uL (ref 1.4–6.5)
Neutrophils Relative %: 76 %
PLATELETS: 83 10*3/uL — AB (ref 150–440)
RBC: 3.45 MIL/uL — ABNORMAL LOW (ref 3.80–5.20)
RDW: 19.3 % — AB (ref 11.5–14.5)
WBC: 2.9 10*3/uL — ABNORMAL LOW (ref 3.6–11.0)

## 2015-07-07 LAB — URINALYSIS COMPLETE WITH MICROSCOPIC (ARMC ONLY)
BILIRUBIN URINE: NEGATIVE
Glucose, UA: NEGATIVE mg/dL
Hgb urine dipstick: NEGATIVE
NITRITE: NEGATIVE
PH: 6 (ref 5.0–8.0)
PROTEIN: 100 mg/dL — AB
SPECIFIC GRAVITY, URINE: 1.014 (ref 1.005–1.030)

## 2015-07-07 LAB — BASIC METABOLIC PANEL
ANION GAP: 5 (ref 5–15)
BUN: 14 mg/dL (ref 6–20)
CHLORIDE: 91 mmol/L — AB (ref 101–111)
CO2: 41 mmol/L — AB (ref 22–32)
Calcium: 9.4 mg/dL (ref 8.9–10.3)
Creatinine, Ser: 0.65 mg/dL (ref 0.44–1.00)
GFR calc non Af Amer: >60 mL/min (ref >60)
Glucose, Bld: 121 mg/dL — ABNORMAL HIGH (ref 65–99)
Potassium: 4.2 mmol/L (ref 3.5–5.1)
Sodium: 137 mmol/L (ref 135–145)

## 2015-07-07 LAB — MAGNESIUM: Magnesium: 1.4 mg/dL — ABNORMAL LOW (ref 1.7–2.4)

## 2015-07-07 MED ORDER — SODIUM CHLORIDE 0.9 % IV BOLUS (SEPSIS)
1000.0000 mL | Freq: Once | INTRAVENOUS | Status: AC
  Administered 2015-07-07: 1000 mL via INTRAVENOUS

## 2015-07-07 MED ORDER — CEPHALEXIN 500 MG PO CAPS: 500.0000 mg | ORAL_CAPSULE | Freq: Three times a day (TID) | ORAL | Status: DC

## 2015-07-07 NOTE — ED Notes (Signed)
Pt presents with complaints of fatigue and inability to eat. Pt is currently being treated for cancer. Last radiation treatment two days ago. Last chemo treatment one week ago.

## 2015-07-07 NOTE — ED Notes (Signed)
AAOx3.  Skin warm and dry.  NAD.  Patient has chronic pain management and take oxycodone 30 mg Q 4 hours.  Dr. Archie Balboa made aware of c/o headache and patient will take home medications once home.  Patient and spouse verbalize understanding of plan.

## 2015-07-07 NOTE — Discharge Instructions (Signed)
Please seek medical attention for any high fevers, chest pain, shortness of breath, change in behavior, persistent vomiting, bloody stool or any other new or concerning symptoms. ° ° °Urinary Tract Infection °Urinary tract infections (UTIs) can develop anywhere along your urinary tract. Your urinary tract is your body's drainage system for removing wastes and extra water. Your urinary tract includes two kidneys, two ureters, a bladder, and a urethra. Your kidneys are a pair of bean-shaped organs. Each kidney is about the size of your fist. They are located below your ribs, one on each side of your spine. °CAUSES °Infections are caused by microbes, which are microscopic organisms, including fungi, viruses, and bacteria. These organisms are so small that they can only be seen through a microscope. Bacteria are the microbes that most commonly cause UTIs. °SYMPTOMS  °Symptoms of UTIs may vary by age and gender of the patient and by the location of the infection. Symptoms in young women typically include a frequent and intense urge to urinate and a painful, burning feeling in the bladder or urethra during urination. Older women and men are more likely to be tired, shaky, and weak and have muscle aches and abdominal pain. A fever may mean the infection is in your kidneys. Other symptoms of a kidney infection include pain in your back or sides below the ribs, nausea, and vomiting. °DIAGNOSIS °To diagnose a UTI, your caregiver will ask you about your symptoms. Your caregiver will also ask you to provide a urine sample. The urine sample will be tested for bacteria and white blood cells. White blood cells are made by your body to help fight infection. °TREATMENT  °Typically, UTIs can be treated with medication. Because most UTIs are caused by a bacterial infection, they usually can be treated with the use of antibiotics. The choice of antibiotic and length of treatment depend on your symptoms and the type of bacteria causing  your infection. °HOME CARE INSTRUCTIONS °· If you were prescribed antibiotics, take them exactly as your caregiver instructs you. Finish the medication even if you feel better after you have only taken some of the medication. °· Drink enough water and fluids to keep your urine clear or pale yellow. °· Avoid caffeine, tea, and carbonated beverages. They tend to irritate your bladder. °· Empty your bladder often. Avoid holding urine for long periods of time. °· Empty your bladder before and after sexual intercourse. °· After a bowel movement, women should cleanse from front to back. Use each tissue only once. °SEEK MEDICAL CARE IF:  °· You have back pain. °· You develop a fever. °· Your symptoms do not begin to resolve within 3 days. °SEEK IMMEDIATE MEDICAL CARE IF:  °· You have severe back pain or lower abdominal pain. °· You develop chills. °· You have nausea or vomiting. °· You have continued burning or discomfort with urination. °MAKE SURE YOU:  °· Understand these instructions. °· Will watch your condition. °· Will get help right away if you are not doing well or get worse. °  °This information is not intended to replace advice given to you by your health care provider. Make sure you discuss any questions you have with your health care provider. °  °Document Released: 05/08/2005 Document Revised: 04/19/2015 Document Reviewed: 09/06/2011 °[employer] Interactive Patient Education ©2016 [employer] ° °
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
## 2015-07-08 ENCOUNTER — Emergency Department: Payer: Medicare Other

## 2015-07-08 ENCOUNTER — Encounter: Payer: Self-pay | Admitting: Emergency Medicine

## 2015-07-08 ENCOUNTER — Inpatient Hospital Stay
Admission: EM | Admit: 2015-07-08 | Discharge: 2015-07-11 | DRG: 698 | Disposition: A | Discharge status: Home / Self Care | Payer: Medicare Other | Attending: Internal Medicine | Admitting: Internal Medicine

## 2015-07-08 DIAGNOSIS — G894 Chronic pain syndrome: Secondary | ICD-10-CM | POA: Diagnosis present

## 2015-07-08 DIAGNOSIS — Z91040 Latex allergy status: Secondary | ICD-10-CM

## 2015-07-08 DIAGNOSIS — I129 Hypertensive chronic kidney disease with stage 1 through stage 4 chronic kidney disease, or unspecified chronic kidney disease: Secondary | ICD-10-CM | POA: Diagnosis present

## 2015-07-08 DIAGNOSIS — K5900 Constipation, unspecified: Secondary | ICD-10-CM | POA: Diagnosis present

## 2015-07-08 DIAGNOSIS — Z66 Do not resuscitate: Secondary | ICD-10-CM | POA: Diagnosis present

## 2015-07-08 DIAGNOSIS — Z90710 Acquired absence of both cervix and uterus: Secondary | ICD-10-CM | POA: Diagnosis not present

## 2015-07-08 DIAGNOSIS — R1030 Lower abdominal pain, unspecified: Secondary | ICD-10-CM | POA: Diagnosis present

## 2015-07-08 DIAGNOSIS — F17210 Nicotine dependence, cigarettes, uncomplicated: Secondary | ICD-10-CM | POA: Diagnosis present

## 2015-07-08 DIAGNOSIS — R410 Disorientation, unspecified: Secondary | ICD-10-CM | POA: Diagnosis not present

## 2015-07-08 DIAGNOSIS — Z888 Allergy status to other drugs, medicaments and biological substances status: Secondary | ICD-10-CM

## 2015-07-08 DIAGNOSIS — Z79899 Other long term (current) drug therapy: Secondary | ICD-10-CM

## 2015-07-08 DIAGNOSIS — Z9981 Dependence on supplemental oxygen: Secondary | ICD-10-CM | POA: Diagnosis not present

## 2015-07-08 DIAGNOSIS — Z9049 Acquired absence of other specified parts of digestive tract: Secondary | ICD-10-CM

## 2015-07-08 DIAGNOSIS — Z923 Personal history of irradiation: Secondary | ICD-10-CM

## 2015-07-08 DIAGNOSIS — N3040 Irradiation cystitis without hematuria: Secondary | ICD-10-CM | POA: Diagnosis present

## 2015-07-08 DIAGNOSIS — M797 Fibromyalgia: Secondary | ICD-10-CM | POA: Diagnosis present

## 2015-07-08 DIAGNOSIS — J45909 Unspecified asthma, uncomplicated: Secondary | ICD-10-CM | POA: Diagnosis present

## 2015-07-08 DIAGNOSIS — J432 Centrilobular emphysema: Secondary | ICD-10-CM

## 2015-07-08 DIAGNOSIS — Z87440 Personal history of urinary (tract) infections: Secondary | ICD-10-CM | POA: Diagnosis not present

## 2015-07-08 DIAGNOSIS — K520 Gastroenteritis and colitis due to radiation: Secondary | ICD-10-CM

## 2015-07-08 DIAGNOSIS — Y842 Radiological procedure and radiotherapy as the cause of abnormal reaction of the patient, or of later complication, without mention of misadventure at the time of the procedure: Secondary | ICD-10-CM | POA: Diagnosis present

## 2015-07-08 DIAGNOSIS — R102 Pelvic and perineal pain: Secondary | ICD-10-CM

## 2015-07-08 DIAGNOSIS — Z808 Family history of malignant neoplasm of other organs or systems: Secondary | ICD-10-CM

## 2015-07-08 DIAGNOSIS — Z9221 Personal history of antineoplastic chemotherapy: Secondary | ICD-10-CM | POA: Diagnosis not present

## 2015-07-08 DIAGNOSIS — Z803 Family history of malignant neoplasm of breast: Secondary | ICD-10-CM | POA: Diagnosis not present

## 2015-07-08 DIAGNOSIS — J9811 Atelectasis: Secondary | ICD-10-CM | POA: Diagnosis not present

## 2015-07-08 DIAGNOSIS — Z9889 Other specified postprocedural states: Secondary | ICD-10-CM

## 2015-07-08 DIAGNOSIS — D539 Nutritional anemia, unspecified: Secondary | ICD-10-CM | POA: Diagnosis present

## 2015-07-08 DIAGNOSIS — E538 Deficiency of other specified B group vitamins: Secondary | ICD-10-CM | POA: Diagnosis present

## 2015-07-08 DIAGNOSIS — G9341 Metabolic encephalopathy: Secondary | ICD-10-CM | POA: Diagnosis present

## 2015-07-08 DIAGNOSIS — K219 Gastro-esophageal reflux disease without esophagitis: Secondary | ICD-10-CM | POA: Diagnosis present

## 2015-07-08 DIAGNOSIS — E876 Hypokalemia: Secondary | ICD-10-CM | POA: Diagnosis not present

## 2015-07-08 DIAGNOSIS — R4182 Altered mental status, unspecified: Secondary | ICD-10-CM | POA: Diagnosis not present

## 2015-07-08 DIAGNOSIS — R197 Diarrhea, unspecified: Secondary | ICD-10-CM

## 2015-07-08 DIAGNOSIS — M1990 Unspecified osteoarthritis, unspecified site: Secondary | ICD-10-CM | POA: Diagnosis present

## 2015-07-08 DIAGNOSIS — Z801 Family history of malignant neoplasm of trachea, bronchus and lung: Secondary | ICD-10-CM | POA: Diagnosis not present

## 2015-07-08 DIAGNOSIS — R109 Unspecified abdominal pain: Secondary | ICD-10-CM

## 2015-07-08 DIAGNOSIS — N390 Urinary tract infection, site not specified: Secondary | ICD-10-CM

## 2015-07-08 DIAGNOSIS — N189 Chronic kidney disease, unspecified: Secondary | ICD-10-CM | POA: Diagnosis present

## 2015-07-08 DIAGNOSIS — Z882 Allergy status to sulfonamides status: Secondary | ICD-10-CM

## 2015-07-08 DIAGNOSIS — J9611 Chronic respiratory failure with hypoxia: Secondary | ICD-10-CM | POA: Diagnosis present

## 2015-07-08 DIAGNOSIS — Z9181 History of falling: Secondary | ICD-10-CM

## 2015-07-08 DIAGNOSIS — R5382 Chronic fatigue, unspecified: Secondary | ICD-10-CM

## 2015-07-08 DIAGNOSIS — J449 Chronic obstructive pulmonary disease, unspecified: Secondary | ICD-10-CM | POA: Diagnosis present

## 2015-07-08 DIAGNOSIS — C679 Malignant neoplasm of bladder, unspecified: Secondary | ICD-10-CM

## 2015-07-08 DIAGNOSIS — R531 Weakness: Secondary | ICD-10-CM

## 2015-07-08 LAB — URINALYSIS COMPLETE WITH MICROSCOPIC (ARMC ONLY)
Bacteria, UA: NONE SEEN
Bilirubin Urine: NEGATIVE
Glucose, UA: NEGATIVE mg/dL
KETONES UR: NEGATIVE mg/dL
LEUKOCYTES UA: NEGATIVE
NITRITE: NEGATIVE
PH: 8 (ref 5.0–8.0)
PROTEIN: NEGATIVE mg/dL
SPECIFIC GRAVITY, URINE: 1.004 — AB (ref 1.005–1.030)

## 2015-07-08 LAB — CBC WITH DIFFERENTIAL/PLATELET
Basophils Absolute: 0 10*3/uL (ref 0–0.1)
Basophils Relative: 0 %
Eosinophils Absolute: 0 10*3/uL (ref 0–0.7)
Eosinophils Relative: 1 %
HCT: 36.6 % (ref 35.0–47.0)
Hemoglobin: 12.1 g/dL (ref 12.0–16.0)
Lymphocytes Relative: 18 %
Lymphs Abs: 1.1 10*3/uL (ref 1.0–3.6)
MCH: 34 pg (ref 26.0–34.0)
MCHC: 33.1 g/dL (ref 32.0–36.0)
MCV: 102.7 fL — ABNORMAL HIGH (ref 80.0–100.0)
Monocytes Absolute: 1.1 10*3/uL — ABNORMAL HIGH (ref 0.2–0.9)
Monocytes Relative: 19 %
Neutro Abs: 3.8 10*3/uL (ref 1.4–6.5)
Neutrophils Relative %: 62 %
Platelets: 96 10*3/uL — ABNORMAL LOW (ref 150–440)
RBC: 3.56 MIL/uL — ABNORMAL LOW (ref 3.80–5.20)
RDW: 19.3 % — ABNORMAL HIGH (ref 11.5–14.5)
WBC: 6.1 10*3/uL (ref 3.6–11.0)

## 2015-07-08 LAB — COMPREHENSIVE METABOLIC PANEL
ALT: 8 U/L — ABNORMAL LOW (ref 14–54)
AST: 19 U/L (ref 15–41)
Albumin: 3.3 g/dL — ABNORMAL LOW (ref 3.5–5.0)
Alkaline Phosphatase: 62 U/L (ref 38–126)
Anion gap: 4 — ABNORMAL LOW (ref 5–15)
BUN: 10 mg/dL (ref 6–20)
CO2: 44 mmol/L — ABNORMAL HIGH (ref 22–32)
Calcium: 10.4 mg/dL — ABNORMAL HIGH (ref 8.9–10.3)
Chloride: 86 mmol/L — ABNORMAL LOW (ref 101–111)
Creatinine, Ser: 0.55 mg/dL (ref 0.44–1.00)
GFR calc Af Amer: >60 mL/min (ref >60)
GFR calc non Af Amer: >60 mL/min (ref >60)
Glucose, Bld: 97 mg/dL (ref 65–99)
Potassium: 4 mmol/L (ref 3.5–5.1)
Sodium: 134 mmol/L — ABNORMAL LOW (ref 135–145)
Total Bilirubin: 0.9 mg/dL (ref 0.3–1.2)
Total Protein: 6.8 g/dL (ref 6.5–8.1)

## 2015-07-08 LAB — LACTIC ACID, PLASMA
LACTIC ACID, VENOUS: 0.7 mmol/L (ref 0.5–2.0)
Lactic Acid, Venous: 0.5 mmol/L (ref 0.5–2.0)

## 2015-07-08 LAB — LIPASE, BLOOD: Lipase: 15 U/L (ref 11–51)

## 2015-07-08 LAB — TROPONIN I: Troponin I: <0.03 ng/mL (ref <0.031)

## 2015-07-08 MED ORDER — CHOLECALCIFEROL 10 MCG (400 UNIT) PO TABS
800.0000 [IU] | ORAL_TABLET | Freq: Every day | ORAL | Status: DC
  Administered 2015-07-08 – 2015-07-11 (×4): 800 [IU] via ORAL
  Filled 2015-07-08 (×4): qty 2

## 2015-07-08 MED ORDER — ONDANSETRON HCL 4 MG/2ML IJ SOLN: 4.0000 mg | Freq: Four times a day (QID) | INTRAMUSCULAR | Status: DC | PRN

## 2015-07-08 MED ORDER — ARFORMOTEROL TARTRATE 15 MCG/2ML IN NEBU
15.0000 ug | INHALATION_SOLUTION | Freq: Two times a day (BID) | RESPIRATORY_TRACT | Status: DC
  Administered 2015-07-09 – 2015-07-11 (×5): 15 ug via RESPIRATORY_TRACT
  Filled 2015-07-08 (×8): qty 2

## 2015-07-08 MED ORDER — DIAZEPAM 5 MG PO TABS
5.0000 mg | ORAL_TABLET | Freq: Four times a day (QID) | ORAL | Status: DC
  Administered 2015-07-08 – 2015-07-11 (×13): 5 mg via ORAL
  Filled 2015-07-08 (×13): qty 1

## 2015-07-08 MED ORDER — SODIUM CHLORIDE 0.9 % IV SOLN
Freq: Once | INTRAVENOUS | Status: AC
  Administered 2015-07-08: 08:00:00 via INTRAVENOUS

## 2015-07-08 MED ORDER — LIDOCAINE-PRILOCAINE 2.5-2.5 % EX KIT
1.0000 | PACK | CUTANEOUS | Status: DC | PRN
Start: 2015-07-08 — End: 2015-07-11
  Filled 2015-07-08: qty 1

## 2015-07-08 MED ORDER — ALBUTEROL SULFATE (2.5 MG/3ML) 0.083% IN NEBU: 2.5000 mg | INHALATION_SOLUTION | Freq: Four times a day (QID) | RESPIRATORY_TRACT | Status: DC | PRN

## 2015-07-08 MED ORDER — TIOTROPIUM BROMIDE MONOHYDRATE 18 MCG IN CAPS
18.0000 ug | ORAL_CAPSULE | Freq: Every day | RESPIRATORY_TRACT | Status: DC
  Administered 2015-07-08 – 2015-07-11 (×4): 18 ug via RESPIRATORY_TRACT
  Filled 2015-07-08: qty 5

## 2015-07-08 MED ORDER — ACETAMINOPHEN 325 MG PO TABS: 650.0000 mg | ORAL_TABLET | Freq: Four times a day (QID) | ORAL | Status: DC | PRN

## 2015-07-08 MED ORDER — SODIUM CHLORIDE 0.9 % IV SOLN
INTRAVENOUS | Status: DC
  Administered 2015-07-08 – 2015-07-09 (×2): via INTRAVENOUS

## 2015-07-08 MED ORDER — ACETAMINOPHEN 650 MG RE SUPP: 650.0000 mg | Freq: Four times a day (QID) | RECTAL | Status: DC | PRN

## 2015-07-08 MED ORDER — DOCUSATE SODIUM 50 MG PO CAPS: 50.0000 mg | ORAL_CAPSULE | Freq: Two times a day (BID) | ORAL | Status: DC

## 2015-07-08 MED ORDER — PREDNISONE 1 MG PO TABS: 5.0000 mg | ORAL_TABLET | Freq: Two times a day (BID) | ORAL | Status: DC

## 2015-07-08 MED ORDER — ALUM & MAG HYDROXIDE-SIMETH 200-200-20 MG/5ML PO SUSP: 30.0000 mL | Freq: Four times a day (QID) | ORAL | Status: DC | PRN

## 2015-07-08 MED ORDER — FLEET ENEMA 7-19 GM/118ML RE ENEM: 1.0000 | ENEMA | Freq: Once | RECTAL | Status: DC

## 2015-07-08 MED ORDER — OXYCODONE HCL 5 MG PO TABS
30.0000 mg | ORAL_TABLET | ORAL | Status: DC | PRN
  Administered 2015-07-08 – 2015-07-09 (×3): 30 mg via ORAL
  Filled 2015-07-08 (×3): qty 6

## 2015-07-08 MED ORDER — PSYLLIUM 95 % PO PACK: 1.0000 | PACK | ORAL | Status: DC

## 2015-07-08 MED ORDER — DOCUSATE SODIUM 100 MG PO CAPS
100.0000 mg | ORAL_CAPSULE | Freq: Two times a day (BID) | ORAL | Status: DC
  Administered 2015-07-08 – 2015-07-11 (×7): 100 mg via ORAL
  Filled 2015-07-08 (×7): qty 1

## 2015-07-08 MED ORDER — RISAQUAD PO CAPS
ORAL_CAPSULE | Freq: Every day | ORAL | Status: DC
  Administered 2015-07-08 – 2015-07-11 (×4): 1 via ORAL
  Filled 2015-07-08 (×4): qty 1

## 2015-07-08 MED ORDER — LORATADINE 10 MG PO TABS
10.0000 mg | ORAL_TABLET | Freq: Every day | ORAL | Status: DC
  Administered 2015-07-08 – 2015-07-11 (×4): 10 mg via ORAL
  Filled 2015-07-08 (×4): qty 1

## 2015-07-08 MED ORDER — ONDANSETRON HCL 4 MG PO TABS: 4.0000 mg | ORAL_TABLET | Freq: Four times a day (QID) | ORAL | Status: DC | PRN

## 2015-07-08 MED ORDER — DEXTROSE 5 % IV SOLN
1.0000 g | INTRAVENOUS | Status: DC
  Administered 2015-07-08 – 2015-07-11 (×4): 1 g via INTRAVENOUS
  Filled 2015-07-08 (×5): qty 10

## 2015-07-08 MED ORDER — POLYETHYLENE GLYCOL 3350 17 GM/SCOOP PO POWD
1.0000 | Freq: Two times a day (BID) | ORAL | Status: DC
  Filled 2015-07-08: qty 255

## 2015-07-08 MED ORDER — CALCIUM CARBONATE-VITAMIN D 500-200 MG-UNIT PO TABS
2.0000 | ORAL_TABLET | Freq: Every day | ORAL | Status: DC
Start: 2015-07-08 — End: 2015-07-11
  Administered 2015-07-08 – 2015-07-11 (×4): 2 via ORAL
  Filled 2015-07-08 (×4): qty 2

## 2015-07-08 MED ORDER — BUDESONIDE 0.25 MG/2ML IN SUSP
0.2500 mg | Freq: Two times a day (BID) | RESPIRATORY_TRACT | Status: DC
  Administered 2015-07-08 – 2015-07-11 (×6): 0.25 mg via RESPIRATORY_TRACT
  Filled 2015-07-08 (×6): qty 2

## 2015-07-08 MED ORDER — DIPHENOXYLATE-ATROPINE 2.5-0.025 MG PO TABS: 1.0000 | ORAL_TABLET | Freq: Four times a day (QID) | ORAL | Status: DC | PRN

## 2015-07-08 MED ORDER — DULOXETINE HCL 20 MG PO CPEP
20.0000 mg | ORAL_CAPSULE | Freq: Every day | ORAL | Status: DC
  Administered 2015-07-08 – 2015-07-11 (×4): 20 mg via ORAL
  Filled 2015-07-08 (×4): qty 1

## 2015-07-08 MED ORDER — HYDRALAZINE HCL 20 MG/ML IJ SOLN: 10.0000 mg | Freq: Four times a day (QID) | INTRAMUSCULAR | Status: DC | PRN

## 2015-07-08 MED ORDER — CYCLOBENZAPRINE HCL 10 MG PO TABS
10.0000 mg | ORAL_TABLET | Freq: Three times a day (TID) | ORAL | Status: DC | PRN
  Administered 2015-07-09: 07:00:00 10 mg via ORAL
  Filled 2015-07-08: qty 1

## 2015-07-08 MED ORDER — FAMOTIDINE 20 MG PO TABS
10.0000 mg | ORAL_TABLET | Freq: Every day | ORAL | Status: DC
  Administered 2015-07-08 – 2015-07-11 (×4): 10 mg via ORAL
  Filled 2015-07-08 (×4): qty 1

## 2015-07-08 MED ORDER — HYDROMORPHONE HCL 1 MG/ML IJ SOLN
1.0000 mg | INTRAMUSCULAR | Status: DC | PRN
  Administered 2015-07-08 – 2015-07-09 (×4): 1 mg via INTRAVENOUS
  Filled 2015-07-08 (×5): qty 1

## 2015-07-08 MED ORDER — HYOSCYAMINE SULFATE 0.125 MG PO TABS
0.1250 mg | ORAL_TABLET | ORAL | Status: DC | PRN
Start: 2015-07-08 — End: 2015-07-11
  Filled 2015-07-08: qty 1

## 2015-07-08 MED ORDER — HYDROCODONE-ACETAMINOPHEN 5-325 MG PO TABS: 1.0000 | ORAL_TABLET | ORAL | Status: DC | PRN

## 2015-07-08 MED ORDER — DEXTROSE 5 % IV SOLN
500.0000 mg | INTRAVENOUS | Status: DC
  Administered 2015-07-08 – 2015-07-09 (×2): 500 mg via INTRAVENOUS
  Filled 2015-07-08 (×3): qty 500

## 2015-07-08 NOTE — Evaluation (Signed)
Physical Therapy Evaluation Patient Details Name: Rebecca Holland MRN: [id number] DOB: 1950-09-08 Today's Date: 07/08/2015   History of Present Illness  Pt here with AMS, likely from UTI.  She generally has weakness after her chemo for bladder cancer, per daughter she typically regains her strength quickly in these situations, but her confusion is not typical.  Pt has been having increasing frequency of falls recently.   Clinical Impression  Pt is confused and impulsive t/o session but is able to participate inconsistently.  Daughter is initially hesitant for pt to work with PT, but is encouraging once we got started.  Pt with pain and weakness is limited with what she can do and shows poor safety awareness in getting up.  Pt/daughter would like her to go home with HHPT, but per today's performance she will likely need STR.    Follow Up Recommendations SNF (per progress, would like to go home)    Equipment Recommendations       Recommendations for Other Services       Precautions / Restrictions Precautions Precautions: Fall Restrictions Weight Bearing Restrictions: No      Mobility  Bed Mobility Overal bed mobility: Modified Independent             General bed mobility comments: Pt impulsive with getting to EOB, needs some cuing and assist for safety  Transfers Overall transfer level: Modified independent Equipment used: Rolling walker (2 wheeled)             General transfer comment: Pt is unsteady getting into standing and though she does not have a loss of balance her confusion and impulsivity require direct CGA   Ambulation/Gait Ambulation/Gait assistance: Mod assist;Min assist Ambulation Distance (Feet): 5 Feet Assistive device: Rolling walker (2 wheeled);1 person hand held assist       General Gait Details: Pt is too weak, impulsive and unsteady to try any true walking.  Did get to [facility].  O2 91% after the effort and pt very fatigued.  Stairs             Wheelchair Mobility    Modified Rankin (Stroke Patients Only)       Balance                                             Pertinent Vitals/Pain Pain Assessment:  (has significant chronic pain)    Home Living Family/patient expects to be discharged to:: Private residence Living Arrangements: Spouse/significant other Available Help at Discharge: Family Type of Home: House Home Access: Stairs to enter Entrance Stairs-Rails: None Entrance Stairs-Number of Steps: 3   Home Equipment: Environmental consultant - 2 wheels;Cane - single point Additional Comments: Family is able to provide 24/7 assist when she has episodes of weakness, husband works full time    Prior Function Level of Independence: Independent with assistive device(s)         Comments: often needs AD, though on good days is functional w/o them     Hand Dominance        Extremity/Trunk Assessment   Upper Extremity Assessment: Generalized weakness (grossly 3+ to 4-/5 t/o, has chronic nerve damage issues)           Lower Extremity Assessment: Generalized weakness (grossly 4-/5 t/o)         Communication   Communication: No difficulties (pt with some confusion)  Cognition Arousal/Alertness: Lethargic  Behavior During Therapy: Anxious Overall Cognitive Status: Impaired/Different from baseline                      General Comments      Exercises        Assessment/Plan    PT Assessment Patient needs continued PT services  PT Diagnosis Difficulty walking;Generalized weakness;Altered mental status   PT Problem List Decreased strength;Decreased activity tolerance;Decreased balance;Decreased mobility;Decreased cognition;Decreased knowledge of use of DME;Decreased safety awareness  PT Treatment Interventions Gait training;DME instruction;Stair training;Functional mobility training;Therapeutic activities;Therapeutic exercise;Balance training;Neuromuscular re-education;Cognitive  remediation   PT Goals (Current goals can be found in the Care Plan section) Acute Rehab PT Goals Patient Stated Goal: get stronger PT Goal Formulation: With patient/family Time For Goal Achievement: 07/22/15 Potential to Achieve Goals: Good    Frequency Min 2X/week   Barriers to discharge        Co-evaluation               End of Session Equipment Utilized During Treatment: Oxygen (2 liters) Activity Tolerance: Patient limited by fatigue (confusion) Patient left: with bed alarm set;with family/visitor present           Time: [id number] PT Time Calculation (min) (ACUTE ONLY): 24 min   Charges:   PT Evaluation $Initial PT Evaluation Tier I: 1 Procedure     PT G Codes:       Rebecca Holland, PT, DPT [phone]  Rebecca Holland 07/08/2015, 3:27 PM
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
## 2015-07-08 NOTE — Progress Notes (Signed)
Reported to Dr Manuella Ghazi that patient had BM. Orders per Dr Dondra Spry miralax and fleet enema

## 2015-07-08 NOTE — Progress Notes (Signed)
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 infectious/inflammatory process or chronic areas of parenchymal scarring.  Similar appearance of the lung noted on prior chest CT 12/26/2014. Electronically Signed   By: Jacqulynn Cadet M.D.   On: 07/08/2015 09:27    Assessment:  The patient is a 64 y.o. woman with clinical stage II (T2NxM0) invasive high grade bladder cancer s/p 3 cycles of gemcitabine and carboplatin (01/11/2015 - 03/08/2015). She is currently undergoing concurrent weekly chemotherapy (carboplatin AUC 2) with daily radiation  (began 05/02/2015; last 06/30/2015).   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 She appeared to have a UTI yesterday (cultures still pending).  She has improved with IVF and antibiotics.  Mental status is near baseline.  She just completed a steroid taper.  Plan:   1.  Agree with IVF and antibiotics. 2.  Follow cultures. 3.  Daily BMP and Mg. 4.  New macrocytic anemia:  check B12, folate, TSH, retic. 5.  Check AM cortisol level.   Thank you for allowing me to participate in Rebecca Holland 's care.  I will follow her closely with you while hospitalized and after discharge in the outpatient  department.  Lequita Asal, MD  07/08/2015, 3:18 PM

## 2015-07-08 NOTE — Consult Note (Signed)
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 requirement due to   Reason to continue Indwelling Urinary Catheter for strict Intake/Output monitoring for hemodynamic instability   Central Line continued, requirement due to   Reason to continue [employer] Monitoring of central venous pressure or other hemodynamic parameters   Ventilator continued, requirement due to, resp failure    Ventilator Sedation RASS 0 to -2   Cultures: BCx2  UC 11/25>> Sputum  Antibiotics: Rocephin 11/26>> Azithromycin 11/26>>  Lines:   ASSESSMENT/PLAN  64 yo female with Bladder Ca (high grade)  on chemo/rads, 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  There is no significant change in these 2 CTs. - continue smoking along with COPD/Emphysema can cause some of these findings, the patient has been counseled extensively on tobacco cessation - cont with incentive spirometry - no further pulmonary intervention warranted - f/u  with Dr. Ashby Dawes on 12/27 11.40am  COPD/Asthma - Stage B - FEV1 50%, previously on Symbicort and Spriva, but now only on Spiriva and Anorro due to cost - has a hx of smoking, recommended tobacco cessation - maintain sats >88%, on 2-3L continuous as an outpatient - recently completed a course of prednisone for radiation cystitis, no further prednisone needed from a pulmonary standpoint at this time - has chronic RLL atlectasis and scarring, most likely from previous infections - recommend Incentive spirometry and tobacco cessation - cont with Anorro and Sprivia as an outpatient - can use brovana and pulmicort during her inpatient stay, along with Spiriva  Encephalopathy - confusion, which is mild and resolving - secondary to underlying UTI - cont with UTI treatment, f/u S/S - check B12, folate, TSH  Bladder Cancer - stage II (T2NxM0) invasive high grade bladder cancer s/p 3 cycles of gemcitabine and carboplatin (01/11/2015 - 03/08/2015). She is currently undergoing concurrent weekly chemotherapy (carboplatin AUC 2) with daily radiation (began 05/02/2015; last 06/30/2015).    Thank you for consulting [facility], we will signoff at this time.  Please feel free to contacts [place] with any questions.    I have personally obtained a history, examined the patient, evaluated laboratory and imaging results, formulated the assessment and plan and placed orders.  Pulmonary Consult Time devoted to patient care services described in this note is 45 minutes.   Overall, patient is critically ill, prognosis is guarded. Patient at high risk for cardiac arrest and death.   Vilinda Boehringer, MD  Pulmonary and Critical Care Pager [phone] (please enter 7-digits) On Call Pager [phone] (please enter 7-digits)     07/08/2015, 6:07 PM  Note: This note was prepared with Dragon dictation along with smaller phrase technology. Any transcriptional errors that  result from this process are unintentional.
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
## 2015-07-08 NOTE — Plan of Care (Signed)
Problem: Urinary Elimination: Goal: Signs and symptoms of infection will decrease Outcome: Progressing Pain medications given with noted relief. VSS. IV fluids infusing. IV antibiotics given.

## 2015-07-08 NOTE — ED Notes (Addendum)
Per EMS this pt was seen here yesterday for UTI and given Cipro for tx.  Tonight she was picked up by EMS for lethargy and pt said she fell but does not remember how or the circumstances of her fall.  She is oriented to person and place.  Pt states when she fell she hit her back and head and is reporting 10/10 pain in her back.  Pt's husband is at bedside and is not a very good historian.  She does not like BP cuff and used some harsh language to tell us.

## 2015-07-09 LAB — CBC
HEMATOCRIT: 38 % (ref 35.0–47.0)
HEMOGLOBIN: 12.9 g/dL (ref 12.0–16.0)
MCH: 33.9 pg (ref 26.0–34.0)
MCHC: 33.9 g/dL (ref 32.0–36.0)
MCV: 99.9 fL (ref 80.0–100.0)
Platelets: 75 10*3/uL — ABNORMAL LOW (ref 150–440)
RBC: 3.8 MIL/uL (ref 3.80–5.20)
RDW: 18.6 % — ABNORMAL HIGH (ref 11.5–14.5)
WBC: 3.4 10*3/uL — ABNORMAL LOW (ref 3.6–11.0)

## 2015-07-09 LAB — URINE CULTURE

## 2015-07-09 LAB — MAGNESIUM
MAGNESIUM: 2.2 mg/dL (ref 1.7–2.4)
Magnesium: 1.2 mg/dL — ABNORMAL LOW (ref 1.7–2.4)

## 2015-07-09 LAB — CORTISOL: Cortisol, Plasma: 31 ug/dL

## 2015-07-09 LAB — BASIC METABOLIC PANEL
ANION GAP: 6 (ref 5–15)
BUN: 8 mg/dL (ref 6–20)
CO2: 42 mmol/L — AB (ref 22–32)
Calcium: 9.7 mg/dL (ref 8.9–10.3)
Chloride: 87 mmol/L — ABNORMAL LOW (ref 101–111)
Creatinine, Ser: 0.51 mg/dL (ref 0.44–1.00)
GFR calc non Af Amer: >60 mL/min (ref >60)
GLUCOSE: 99 mg/dL (ref 65–99)
POTASSIUM: 2.8 mmol/L — AB (ref 3.5–5.1)
Sodium: 135 mmol/L (ref 135–145)

## 2015-07-09 LAB — VITAMIN B12: Vitamin B-12: 179 pg/mL — ABNORMAL LOW (ref 180–914)

## 2015-07-09 LAB — TSH: TSH: 3.702 u[IU]/mL (ref 0.350–4.500)

## 2015-07-09 LAB — RETICULOCYTES
RBC.: 3.8 MIL/uL (ref 3.80–5.20)
Retic Count, Absolute: 49.4 10*3/uL (ref 19.0–183.0)
Retic Ct Pct: 1.3 % (ref 0.4–3.1)

## 2015-07-09 LAB — FOLATE: Folate: 8.8 ng/mL (ref >5.9)

## 2015-07-09 LAB — POTASSIUM: Potassium: 3.4 mmol/L — ABNORMAL LOW (ref 3.5–5.1)

## 2015-07-09 MED ORDER — POTASSIUM CHLORIDE CRYS ER 20 MEQ PO TBCR
20.0000 meq | EXTENDED_RELEASE_TABLET | Freq: Two times a day (BID) | ORAL | Status: DC
  Administered 2015-07-09 – 2015-07-10 (×3): 20 meq via ORAL
  Filled 2015-07-09 (×3): qty 1

## 2015-07-09 MED ORDER — POTASSIUM CHLORIDE CRYS ER 20 MEQ PO TBCR
40.0000 meq | EXTENDED_RELEASE_TABLET | Freq: Once | ORAL | Status: AC
  Administered 2015-07-09: 07:00:00 40 meq via ORAL
  Filled 2015-07-09: qty 2

## 2015-07-09 MED ORDER — POTASSIUM CHLORIDE IN NACL 20-0.9 MEQ/L-% IV SOLN
INTRAVENOUS | Status: DC
  Administered 2015-07-09 – 2015-07-10 (×3): via INTRAVENOUS
  Filled 2015-07-09 (×3): qty 1000

## 2015-07-09 MED ORDER — MAGNESIUM SULFATE 4 GM/100ML IV SOLN
4.0000 g | Freq: Once | INTRAVENOUS | Status: AC
  Administered 2015-07-09: 08:00:00 4 g via INTRAVENOUS
  Filled 2015-07-09: qty 100

## 2015-07-09 MED ORDER — OXYCODONE HCL 5 MG PO TABS
30.0000 mg | ORAL_TABLET | ORAL | Status: DC
  Administered 2015-07-09 – 2015-07-11 (×12): 30 mg via ORAL
  Filled 2015-07-09 (×12): qty 6

## 2015-07-09 MED ORDER — POTASSIUM CHLORIDE 10 MEQ/100ML IV SOLN
10.0000 meq | INTRAVENOUS | Status: AC
  Administered 2015-07-09 (×2): 10 meq via INTRAVENOUS
  Filled 2015-07-09 (×2): qty 100

## 2015-07-09 NOTE — Progress Notes (Signed)
Bed alarm activated with staff immediately responding observing husband getting pt OOB with pt losing balance. RN assisted husband and pt to [facility]. Noted superficial abrasion on posterior left forearm  1/4 cm x 2 cm intermittent areas with area cleanesed with foam dressing applied. Husband and pt educated to "call, don't fall." Pt and husband educated to call staff and let staff assist pt with transfers and he could help by assisting to remind pt not to get OOB until staff arrives. Stated they understood.

## 2015-07-09 NOTE — Progress Notes (Signed)
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 is spending.  She has made a dramatic improvement overnight with fluids and antibiotics.  Urine culture negative.  She has hypokalemia and hypomagnesemia secondary to carboplatin urinary losses.  Mentation improved.  Suspect etiology secondary to chronic debilitation, chemotherapy, dehydration, and infection.   Plan:   1.  Continue IVF and antibiotics. 2.  Potassium and magnesium supplimentation. 3.  Follow cultures and cortisol level. 4.  Follow-up with Dr. Baruch Gouty in AM regarding remaining radiation treatments (patient deciding if she wants to continue). 5.  Patient may need home health care or skilled nursing at discharge.   Thank you for allowing me to participate in JENEVIEVE KIRSCHBAUM 's care.  I will follow her closely with you while hospitalized and after discharge in the outpatient department.  Lequita Asal, MD  07/09/2015, 12:11 PM

## 2015-07-09 NOTE — Plan of Care (Signed)
Problem: Urinary Elimination: Goal: Signs and symptoms of infection will decrease Outcome: Progressing 1.Transferred from CCU accompanied by her family. Pt remains confused and unable to give information. Voids incontinent. Family verbalized understanding of treatment plan and care.  Problem: Education: Goal: Knowledge of [facility] Education information/materials will improve Outcome: Completed/Met Date Met:  07/09/15 General information completed with family. Pt unable due to altered cognition. No safety issues/injuries this shift. Placed at nurses station. HIGH fall risk care in place. Pt calls out, but has not attempted to get OOB.  Problem: Health Behavior/Discharge Planning: Goal: Ability to manage health-related needs will improve Outcome: Progressing Pt unable with family supportive and will assist with discharge plans.  Problem: Pain Managment: Goal: General experience of comfort will improve Outcome: Progressing Reports generalized soreness with BP check/touching; otherwise rested quietly. Comfort measures performed.  Problem: Physical Regulation: Goal: Ability to maintain clinical measurements within normal limits will improve Outcome: Progressing IV antibiotics continued. No fever/VSS/no co's dysuria/alert with continued confusion. Goal: Will remain free from infection Outcome: Progressing Good handwashing education completed and pericare.  Problem: Skin Integrity: Goal: Risk for impaired skin integrity will decrease Outcome: Not Progressing Coccyx area is purplish red with scattered red shallow few broken area with pink foam applied after skin care. Turning from side to side every 2 hours. Communicated to nurse tech of need to avoid back lying.  Problem: Activity: Goal: Risk for activity intolerance will decrease Outcome: Progressing Remained at bedrest with reposition every 1-2 hours.  Problem: Fluid Volume: Goal: Ability to maintain a balanced intake  and output will improve Outcome: Not Progressing Diet ordered with poor po intake this shift. Will continue to offer po's/food in small amounts frequently.

## 2015-07-09 NOTE — Progress Notes (Signed)
Patient ID: Rebecca Holland, female   DOB: November 26, 1950, 64 y.o.   MRN: [id number] [facility] PROGRESS NOTE  PCP: Idelle Crouch, MD  HPI/Subjective: Patient was on outpatient Cipro for suspected urinary tract infection. She's been very weak and falling. She's also had confusion. Family asking for standing dose oxycodone every 4 hours while awake.  Objective: Filed Vitals:   07/09/15 0424 07/09/15 1412  BP: 164/94 149/90  Pulse: 83 95  Temp: 97.8 F (36.6 C) 98.6 F (37 C)  Resp: 20 20    Filed Weights   07/08/15 1316  Weight: 67.359 kg (148 lb 8 oz)    ROS: Review of Systems  Constitutional: Negative for fever and chills.  Eyes: Negative for blurred vision.  Respiratory: Negative for cough and shortness of breath.   Cardiovascular: Negative for chest pain.  Gastrointestinal: Negative for nausea, vomiting, abdominal pain, diarrhea and constipation.  Genitourinary: Negative for dysuria.  Musculoskeletal: Negative for joint pain.  Neurological: Positive for weakness. Negative for dizziness and headaches.   Exam: Physical Exam  HENT:  Nose: No mucosal edema.  Mouth/Throat: No oropharyngeal exudate or posterior oropharyngeal edema.  Eyes: Conjunctivae, EOM and lids are normal. Pupils are equal, round, and reactive to light.  Neck: No JVD present. Carotid bruit is not present. No edema present. No thyroid mass and no thyromegaly present.  Cardiovascular: S1 normal and S2 normal.  Exam reveals no gallop.   Murmur heard.  Systolic murmur is present with a grade of 2/6  Pulses:      Dorsalis pedis pulses are 2+ on the right side, and 2+ on the left side.  Respiratory: No respiratory distress. She has no wheezes. She has no rhonchi. She has no rales.  GI: Soft. Bowel sounds are normal. There is no tenderness.  Musculoskeletal:       Right ankle: She exhibits no swelling.       Left ankle: She exhibits no swelling.  Lymphadenopathy:    She has no cervical  adenopathy.  Neurological: She is alert. No cranial nerve deficit.  Skin: Skin is warm. No rash noted. Nails show no clubbing.  Psychiatric: She has a normal mood and affect.    Data Reviewed: Basic Metabolic Panel:  Recent Labs Lab 07/07/15 0937 07/08/15 0701 07/09/15 0451  NA 137 134* 135  K 4.2 4.0 2.8*  CL 91* 86* 87*  CO2 41* 44* 42*  GLUCOSE 121* 97 99  BUN 14 10 8   CREATININE 0.65 0.55 0.51  CALCIUM 9.4 10.4* 9.7  MG 1.4*  --  1.2*   Liver Function Tests:  Recent Labs Lab 07/08/15 0701  AST 19  ALT 8*  ALKPHOS 62  BILITOT 0.9  PROT 6.8  ALBUMIN 3.3*    Recent Labs Lab 07/08/15 0701  LIPASE 15   CBC:  Recent Labs Lab 07/07/15 0937 07/08/15 0701 07/09/15 0451  WBC 2.9* 6.1 3.4*  NEUTROABS 2.2 3.8  --   HGB 11.9* 12.1 12.9  HCT 35.4 36.6 38.0  MCV 102.6* 102.7* 99.9  PLT 83* 96* 75*     Recent Results (from the past 240 hour(s))  Urine culture     Status: None   Collection Time: 07/07/15  9:37 AM  Result Value Ref Range Status   Specimen Description URINE, RANDOM  Final   Special Requests NONE  Final   Culture MULTIPLE SPECIES PRESENT, SUGGEST RECOLLECTION  Final   Report Status 07/09/2015 FINAL  Final  Urine culture  Status: None (Preliminary result)   Collection Time: 07/08/15  7:01 AM  Result Value Ref Range Status   Specimen Description URINE, RANDOM  Final   Special Requests Normal  Final   Culture INSIGNIFICANT GROWTH  Final   Report Status PENDING  Incomplete     Studies: Ct Abdomen Pelvis Wo Contrast  07/08/2015  CLINICAL DATA:  Fall and lethargic. Complains of abdominal pain and cough. History of bladder cancer. EXAM: CT CHEST, ABDOMEN AND PELVIS WITHOUT CONTRAST TECHNIQUE: Multidetector CT imaging of the chest, abdomen and pelvis was performed following the standard protocol without IV contrast. COMPARISON:  04/05/2015 and 12/26/2014 FINDINGS: CT CHEST FINDINGS Few coronary artery calcifications. No pericardial or pleural  fluid. Atherosclerotic calcifications in the thoracic aorta without significant enlargement. No significant chest lymphadenopathy. The trachea and mainstem bronchi are patent. However, there appears to be narrowing of the right lower lobe bronchus which may have been present on the CT from 04/05/2015. Peripheral and dependent densities in the right upper lobe and right lower lobe may represent a combination of chronic changes and atelectasis. There is mild dependent atelectasis in the left lower lobe. Overall, no significant airspace disease or consolidation. There is a left subclavian Port-A-Cath with the tip in the lower SVC. CT ABDOMEN AND PELVIS FINDINGS No evidence for free air. Normal appearance of the liver. The gallbladder appears to be absent. No gross abnormality to the pancreas or spleen. Stable 2.5 cm low-density nodule the left adrenal gland is compatible with an adenoma. There is a smaller right adenoma which has minimally changed. No acute abnormality to either kidney. Negative for kidney stones or hydronephrosis. Again noted is wall thickening in the bladder. There is asymmetric thickening along the left side of bladder with bladder wall calcifications. Chronic dilatation of the common bile duct measuring close to 1.3 cm and minimally changed from the prior examination. The abdominal aorta near the renal arteries measures 3.2 cm and stable. The distal abdominal aorta measures up to 3.2 cm and stable. There is no significant abdominal or pelvic lymphadenopathy. No acute abnormality to the stomach, small bowel or colon. Uterus appears to be surgically absent. Evidence for left ovarian tissue and no gross abnormality. No significant free fluid. 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 No acute bone abnormality. IMPRESSION: No acute abnormality in the chest, abdomen or pelvis. Dependent densities in the right lung  most likely represent a combination of chronic changes and atelectasis. Again noted is some collapse or narrowing of the right lower lobe bronchus of unknown etiology. Chronic wall thickening with wall calcifications in the urinary bladder. Findings probably related to the known bladder cancer. Stable abdominal aortic aneurysm measuring up to 3.2 cm. Electronically Signed   By: Markus Daft M.D.   On: 07/08/2015 10:01   Ct Head Wo Contrast  07/08/2015  CLINICAL DATA:  64 year old female status post fall striking the back of her head. Complains of 10/10 back pain EXAM: CT HEAD WITHOUT CONTRAST CT CERVICAL SPINE WITHOUT CONTRAST TECHNIQUE: Multidetector CT imaging of the head and cervical spine was performed following the standard protocol without intravenous contrast. Multiplanar CT image reconstructions of the cervical spine were also generated. COMPARISON:  Concurrently obtained CT scans of the chest, abdomen and pelvis; prior CT scan of the head 12/26/2014 FINDINGS: CT HEAD FINDINGS Negative for acute intracranial hemorrhage, acute infarction, mass, mass effect, hydrocephalus or midline shift. Gray-white differentiation is  preserved throughout. No focal scalp contusion or hematoma. No evidence of calvarial fracture. Visualized portion of the globes and orbits are intact and unremarkable. Partial left mastoid effusion with an osteoma within the air cells unchanged compared to prior imaging. The right mastoid air cells and visualized paranasal sinuses are within normal limits. CT CERVICAL SPINE FINDINGS No acute fracture, malalignment or prevertebral soft tissue swelling. Prior surgical changes of anterior cervical discectomy and fusion at C5-C6 with vertebral body screw and plate construct an interbody graft with successful ankylosis. Right-sided facet arthropathy at C3-C4 and C4-C5. More advanced left-sided facet arthropathy at C3-C4. Mild spondylosis at C4-C5. Unremarkable CT appearance of the thyroid gland. No  acute soft tissue abnormality. Centrilobular emphysema in the upper lungs. Few scattered patchy foci of ground-glass attenuation airspace opacity in the left upper lung. IMPRESSION: CT HEAD 1. No acute intracranial process. 2. 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 These findings are unchanged compared to prior imaging. CT CSPINE 1. No evidence of acute fracture or malalignment. 2. Surgical changes of prior C5-C6 ACDF with successful ankylosis and no evidence of hardware complication. 3. Left greater than right facet arthropathy most notable at C3-C4. 4. Mild focal cervical degenerative disc disease at C4-C5. 5. Centrilobular pulmonary emphysema. 6. Nonspecific scattered patchy foci of ground-glass attenuation opacity in the left upper lung. Differential considerations include an active infectious/inflammatory process or chronic areas of parenchymal scarring. Similar appearance of the lung noted on prior chest CT 12/26/2014. Electronically Signed   By: Jacqulynn Cadet M.D.   On: 07/08/2015 09:27   Ct Chest Wo Contrast  07/08/2015  CLINICAL DATA:  Fall and lethargic. Complains of abdominal pain and cough. History of bladder cancer. EXAM: CT CHEST, ABDOMEN AND PELVIS WITHOUT CONTRAST TECHNIQUE: Multidetector CT imaging of the chest, abdomen and pelvis was performed following the standard protocol without IV contrast. COMPARISON:  04/05/2015 and 12/26/2014 FINDINGS: CT CHEST FINDINGS Few coronary artery calcifications. No pericardial or pleural fluid. Atherosclerotic calcifications in the thoracic aorta without significant enlargement. No significant chest lymphadenopathy. The trachea and mainstem bronchi are patent. However, there appears to be narrowing of the right lower lobe bronchus which may have been present on the CT from 04/05/2015. Peripheral and dependent densities in the right upper lobe and right lower lobe may represent a combination of chronic  changes and atelectasis. There is mild dependent atelectasis in the left lower lobe. Overall, no significant airspace disease or consolidation. There is a left subclavian Port-A-Cath with the tip in the lower SVC. CT ABDOMEN AND PELVIS FINDINGS No evidence for free air. Normal appearance of the liver. The gallbladder appears to be absent. No gross abnormality to the pancreas or spleen. Stable 2.5 cm low-density nodule the left adrenal gland is compatible with an adenoma. There is a smaller right adenoma which has minimally changed. No acute abnormality to either kidney. Negative for kidney stones or hydronephrosis. Again noted is wall thickening in the bladder. There is asymmetric thickening along the left side of bladder with bladder wall calcifications. Chronic dilatation of the common bile duct measuring close to 1.3 cm and minimally changed from the prior examination. The abdominal aorta near the renal arteries measures 3.2 cm and stable. The distal abdominal aorta measures up to 3.2 cm and stable. There is no significant abdominal or pelvic lymphadenopathy. No acute abnormality to the stomach, small bowel or colon. Uterus appears to be surgically absent. Evidence for left ovarian tissue and no gross  abnormality. No significant free fluid. 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 No acute bone abnormality. IMPRESSION: No acute abnormality in the chest, abdomen or pelvis. Dependent densities in the right lung most likely represent a combination of chronic changes and atelectasis. Again noted is some collapse or narrowing of the right lower lobe bronchus of unknown etiology. Chronic wall thickening with wall calcifications in the urinary bladder. Findings probably related to the known bladder cancer. Stable abdominal aortic aneurysm measuring up to 3.2 cm. Electronically Signed   By: Markus Daft M.D.   On: 07/08/2015 10:01    Ct Cervical Spine Wo Contrast  07/08/2015  CLINICAL DATA:  64 year old female status post fall striking the back of her head. Complains of 10/10 back pain EXAM: CT HEAD WITHOUT CONTRAST CT CERVICAL SPINE WITHOUT CONTRAST TECHNIQUE: Multidetector CT imaging of the head and cervical spine was performed following the standard protocol without intravenous contrast. Multiplanar CT image reconstructions of the cervical spine were also generated. COMPARISON:  Concurrently obtained CT scans of the chest, abdomen and pelvis; prior CT scan of the head 12/26/2014 FINDINGS: CT HEAD FINDINGS Negative for acute intracranial hemorrhage, acute infarction, mass, mass effect, hydrocephalus or midline shift. Gray-white differentiation is preserved throughout. No focal scalp contusion or hematoma. No evidence of calvarial fracture. Visualized portion of the globes and orbits are intact and unremarkable. Partial left mastoid effusion with an osteoma within the air cells unchanged compared to prior imaging. The right mastoid air cells and visualized paranasal sinuses are within normal limits. CT CERVICAL SPINE FINDINGS No acute fracture, malalignment or prevertebral soft tissue swelling. Prior surgical changes of anterior cervical discectomy and fusion at C5-C6 with vertebral body screw and plate construct an interbody graft with successful ankylosis. Right-sided facet arthropathy at C3-C4 and C4-C5. More advanced left-sided facet arthropathy at C3-C4. Mild spondylosis at C4-C5. Unremarkable CT appearance of the thyroid gland. No acute soft tissue abnormality. Centrilobular emphysema in the upper lungs. Few scattered patchy foci of ground-glass attenuation airspace opacity in the left upper lung. IMPRESSION: CT HEAD 1. No acute intracranial process. 2. 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 These findings are unchanged compared to prior imaging. CT CSPINE 1. No evidence of acute  fracture or malalignment. 2. Surgical changes of prior C5-C6 ACDF with successful ankylosis and no evidence of hardware complication. 3. Left greater than right facet arthropathy most notable at C3-C4. 4. Mild focal cervical degenerative disc disease at C4-C5. 5. Centrilobular pulmonary emphysema. 6. Nonspecific scattered patchy foci of ground-glass attenuation opacity in the left upper lung. Differential considerations include an active infectious/inflammatory process or chronic areas of parenchymal scarring. Similar appearance of the lung noted on prior chest CT 12/26/2014. Electronically Signed   By: Jacqulynn Cadet M.D.   On: 07/08/2015 09:27    Scheduled Meds: . acidophilus   Oral Daily  . arformoterol  15 mcg Nebulization BID  . azithromycin  500 mg Intravenous Q24H  . budesonide  0.25 mg Nebulization BID  . calcium-vitamin D  2 tablet Oral Daily  . cefTRIAXone (ROCEPHIN)  IV  1 g Intravenous Q24H  . cholecalciferol  800 Units Oral Daily  . diazepam  5 mg Oral QID  . docusate sodium  100 mg Oral BID  . DULoxetine  20 mg Oral Daily  . famotidine  10 mg Oral Daily  . loratadine  10 mg  Oral Daily  . oxycodone  30 mg Oral 6 times per day  . potassium chloride  20 mEq Oral BID  . tiotropium  18 mcg Inhalation Daily   Continuous Infusions: . 0.9 % NaCl with KCl 20 mEq / L 50 mL/hr at 07/09/15 0745    Assessment/Plan:  1. Acute encephalopathy. Good be secondary to urinary infection or even pain medications. Patient was started on IV Rocephin for suspected urinary tract infection. 2. Acute cystitis without hematuria- so far urine culture is negative. Patient may have partially treated urinary tract infection with Cipro. The last 2 urine cultures showed multiple organisms and could have contamination. 3. Hypokalemia and hypomagnesemia- replace magnesium IV, replace potassium orally and then IV fluids and recheck tomorrow. 4. Weakness physical therapy evaluation 5. History of bladder  cancer undergoing chemotherapy and radiation 6. History of constipation 7. Chronic pain syndrome continue outpatient medications 8. Tobacco abuse 9. Cough- patient already on Rocephin and Zithromax was added but I doubt pneumonia. 10. Chronic respiratory failure and COPD- respiratory status stable on oxygen  Code Status:     Code Status Orders        Start     Ordered   07/08/15 1216  Do not attempt resuscitation (DNR)   Continuous    Question Answer Comment  In the event of cardiac or respiratory ARREST Do not call a "code blue"   In the event of cardiac or respiratory ARREST Do not perform Intubation, CPR, defibrillation or ACLS   In the event of cardiac or respiratory ARREST Use medication by any route, position, wound care, and other measures to relive pain and suffering. May use oxygen, suction and manual treatment of airway obstruction as needed for comfort.      07/08/15 1215    Advance Directive Documentation        Most Recent Value   Type of Advance Directive  Living will   Pre-existing out of facility DNR order (yellow form or pink MOST form)     "MOST" Form in Place?       Family Communication: Husband at the bedside Disposition Plan: Home soon  Consultants:  Oncology  Pulmonary  Antibiotics:  Rocephin and Zithromax  Time spent: 30 minutes  Loletha Grayer  [facility]

## 2015-07-09 NOTE — Progress Notes (Signed)
TC with Dr. Leslye Peer and advised of recent K+ po and IV supplment orders that have been given/started.. States no changes in new orders and proceed as ordered.

## 2015-07-09 NOTE — Plan of Care (Signed)
Problem: Education: Goal: Knowledge of treatment and prevention of UTI/Pyleonephritis will improve Outcome: Progressing Continues IV fluids PRN pain meds given for pain with improvement. Incontinent at times. Daughter at bedside.

## 2015-07-10 ENCOUNTER — Inpatient Hospital Stay: Payer: Medicare Other

## 2015-07-10 ENCOUNTER — Other Ambulatory Visit: Payer: Self-pay | Admitting: Hematology and Oncology

## 2015-07-10 ENCOUNTER — Ambulatory Visit: Payer: Medicare Other

## 2015-07-10 ENCOUNTER — Encounter: Payer: Self-pay | Admitting: Hematology and Oncology

## 2015-07-10 ENCOUNTER — Telehealth: Payer: Self-pay | Admitting: *Deleted

## 2015-07-10 DIAGNOSIS — E538 Deficiency of other specified B group vitamins: Secondary | ICD-10-CM | POA: Insufficient documentation

## 2015-07-10 LAB — BASIC METABOLIC PANEL
Anion gap: 3 — ABNORMAL LOW (ref 5–15)
BUN: 15 mg/dL (ref 6–20)
CO2: 37 mmol/L — ABNORMAL HIGH (ref 22–32)
Calcium: 8.8 mg/dL — ABNORMAL LOW (ref 8.9–10.3)
Chloride: 95 mmol/L — ABNORMAL LOW (ref 101–111)
Creatinine, Ser: 0.61 mg/dL (ref 0.44–1.00)
GFR calc Af Amer: >60 mL/min (ref >60)
GFR calc non Af Amer: >60 mL/min (ref >60)
Glucose, Bld: 93 mg/dL (ref 65–99)
Potassium: 3.9 mmol/L (ref 3.5–5.1)
Sodium: 135 mmol/L (ref 135–145)

## 2015-07-10 LAB — URINE CULTURE: Special Requests: NORMAL

## 2015-07-10 LAB — MAGNESIUM: Magnesium: 1.9 mg/dL (ref 1.7–2.4)

## 2015-07-10 MED ORDER — UMECLIDINIUM-VILANTEROL 62.5-25 MCG/INH IN AEPB: 1.0000 | INHALATION_SPRAY | Freq: Every day | RESPIRATORY_TRACT | Status: DC

## 2015-07-10 MED ORDER — POTASSIUM CHLORIDE CRYS ER 20 MEQ PO TBCR
20.0000 meq | EXTENDED_RELEASE_TABLET | Freq: Every day | ORAL | Status: DC
  Administered 2015-07-11: 20 meq via ORAL
  Filled 2015-07-10: qty 1

## 2015-07-10 MED ORDER — AZITHROMYCIN 250 MG PO TABS
500.0000 mg | ORAL_TABLET | Freq: Every day | ORAL | Status: DC
  Administered 2015-07-10 – 2015-07-11 (×2): 500 mg via ORAL
  Filled 2015-07-10 (×2): qty 2

## 2015-07-10 MED ORDER — MAGNESIUM OXIDE 400 (241.3 MG) MG PO TABS
400.0000 mg | ORAL_TABLET | Freq: Every day | ORAL | Status: DC
  Administered 2015-07-11: 10:00:00 400 mg via ORAL
  Filled 2015-07-10: qty 1

## 2015-07-10 MED ORDER — MAGNESIUM SULFATE 2 GM/50ML IV SOLN
2.0000 g | Freq: Once | INTRAVENOUS | Status: AC
  Administered 2015-07-10: 2 g via INTRAVENOUS
  Filled 2015-07-10 (×2): qty 50

## 2015-07-10 NOTE — Telephone Encounter (Signed)
-----   Message from Vilinda Boehringer, MD sent at 07/09/2015  8:17 PM EST ----- Regarding: meds assistance paperwork Patient currently in the hospital, would paper work for med [facility] [place]) assistance.  Call husband to pick up paper work. For some reason does not have an Rx for Anorro per husband.  Please double check this.  Thanks -VM

## 2015-07-10 NOTE — Progress Notes (Signed)
Physical Therapy Treatment Patient Details Name: Rebecca Holland MRN: [id number] DOB: 01/13/1951 Today's Date: 07/10/2015    History of Present Illness Pt here with AMS, likely from UTI.  She generally has weakness after her chemo for bladder cancer, per daughter she typically regains her strength quickly in these situations, but her confusion is not typical.  Pt has been having increasing frequency of falls recently.     PT Comments    Pt tolerating treatment session well, motivated and able to complete entire PT sesssion as planned. Pt continues to make progress toward goals as evidenced by improved ambulation distance, quality, and level of indep. Pt's greatest limitation continues to be impaired safety awareness and impulsivity which continues to limit ability to perform all mobility safely and independently at baseline level. Patient presenting with impairment of strength, balance, O2 perfusion, and activity tolerance, limiting ability to perform ADL and mobility tasks at  baseline level of function. Patient will benefit from skilled intervention to address the above impairments and limitations, in order to restore to prior level of function, improve patient safety upon discharge, and to decrease caregiver burden. Pt and husband attest that 24 hour supervision is available while impulsivity persists, and under these circumstances patient is appropriate for safe DC to home.     Follow Up Recommendations  Home health PT;Supervision/Assistance - 24 hour;Supervision for mobility/OOB     Equipment Recommendations  None recommended by PT    Recommendations for Other Services       Precautions / Restrictions Precautions Precautions: Fall Restrictions Weight Bearing Restrictions: No    Mobility  Bed Mobility               General bed mobility comments: received standing up.   Transfers Overall transfer level: Needs assistance Equipment used: None Transfers: Sit to/from  Stand Sit to Stand: Supervision         General transfer comment: requiring verbal cues to get back onto bed without crossing O2 line.   Ambulation/Gait Ambulation/Gait assistance: Supervision Ambulation Distance (Feet): 600 Feet Assistive device: None       General Gait Details: Some mild weaving, but otherwise stable; stops without warning to read signs on the wall in the unit; asked to ascend 3 stairs limited by O2 tube length, but ascends 6 without warning.    Stairs Stairs: Yes Stairs assistance: Supervision Stair Management: One rail Right Number of Stairs: 9 General stair comments: Requires supervision for safety; pt demonstrating mild impulsivitywith limited awareness of safe practices with mobility.   Wheelchair Mobility    Modified Rankin (Stroke Patients Only)       Balance Overall balance assessment: Modified Independent                                  Cognition Arousal/Alertness: Awake/alert Behavior During Therapy: WFL for tasks assessed/performed;Impulsive (Pt still mildly distractible and impulsive, but is improving per husband. ) Overall Cognitive Status: Impaired/Different from baseline Area of Impairment: Attention;Following commands;Safety/judgement         Safety/Judgement: Decreased awareness of safety;Decreased awareness of deficits     General Comments: Pt trying to climb into bed forward over O2 tubing; trying to ascend stairs while PT is helping to adjust undergarment; removing O2 without warning or explanation; exiting room without warning or explanation while physician is at bedside.     Exercises      General Comments  Pertinent Vitals/Pain Pain Assessment: No/denies pain    Home Living                      Prior Function            PT Goals (current goals can now be found in the care plan section) Acute Rehab PT Goals Patient Stated Goal: get stronger PT Goal Formulation: With  patient/family Time For Goal Achievement: 07/22/15 Potential to Achieve Goals: Good Progress towards PT goals: Progressing toward goals    Frequency  Min 2X/week    PT Plan Discharge plan needs to be updated    Co-evaluation             End of Session Equipment Utilized During Treatment: Oxygen;Gait belt Activity Tolerance: Patient tolerated treatment well Patient left: in bed;with bed alarm set;with family/visitor present;with call bell/phone within reach     Time: 1716-1740 PT Time Calculation (min) (ACUTE ONLY): 24 min  Charges:  $Therapeutic Activity: 23-37 mins                    G Codes:      Selam Pietsch C 2015/07/17, 5:52 PM 5:54 PM  Etta Grandchild, PT, DPT [facility] License # [id number]

## 2015-07-10 NOTE — Plan of Care (Signed)
Problem: Activity: Goal: Risk for activity intolerance will decrease Outcome: Progressing Pt requires minimal assist to bsc .  Occasionally is impulsive in getting oob to use bsc.

## 2015-07-10 NOTE — Care Management (Signed)
Admitted to [facility] with the diagnosis of urinary tract infection. Lives with husband, Nicki Reaper [phone]). Daughter is Darl Householder [phone]). "She works as a Education officer, museum for [employer]." Sees Dr. Doy Hutching every 3 months. Uses a rolling walker and cane to aid in ambulation. Home oxygen thru [facility] x 1.5 years 2-3 liters per nasal cannula continous. Last fall use last Friday. If she is feeling good, appetite is good. No skilled facility. 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 Home Health thru [facility] in the past. Physical therapy evaluation completed. Recommends skilled nursing. Wants to go home with home health.  Shelbie Ammons RN MSN CCM Care Management [phone]

## 2015-07-10 NOTE — Plan of Care (Signed)
Problem: Pain Managment: Goal: General experience of comfort will improve Outcome: Progressing Pt had no PRN pain meds today only scheduled.  Pain consistantly between 6 and 10

## 2015-07-10 NOTE — Progress Notes (Signed)
Patient ID: Rebecca Holland, female   DOB: 1950/09/19, 64 y.o.   MRN: [id number] [facility] PROGRESS NOTE  PCP: Idelle Crouch, MD  HPI/Subjective: Patient still feeling weak. She refuses to go to rehabilitation and once to go home with home health. Her breathing is okay.  Objective: Filed Vitals:   07/09/15 2058 07/10/15 0509  BP: 138/71 124/68  Pulse: 83 79  Temp: 98.4 F (36.9 C) 98.3 F (36.8 C)  Resp: 18 18    Filed Weights   07/08/15 1316  Weight: 67.359 kg (148 lb 8 oz)    ROS: Review of Systems  Constitutional: Negative for fever and chills.  Eyes: Negative for blurred vision.  Respiratory: Negative for cough and shortness of breath.   Cardiovascular: Negative for chest pain.  Gastrointestinal: Negative for nausea, vomiting, abdominal pain, diarrhea and constipation.  Genitourinary: Negative for dysuria.  Musculoskeletal: Negative for joint pain.  Neurological: Positive for weakness. Negative for dizziness and headaches.   Exam: Physical Exam  HENT:  Nose: No mucosal edema.  Mouth/Throat: No oropharyngeal exudate or posterior oropharyngeal edema.  Eyes: Conjunctivae, EOM and lids are normal. Pupils are equal, round, and reactive to light.  Neck: No JVD present. Carotid bruit is not present. No edema present. No thyroid mass and no thyromegaly present.  Cardiovascular: S1 normal and S2 normal.  Exam reveals no gallop.   Murmur heard.  Systolic murmur is present with a grade of 2/6  Pulses:      Dorsalis pedis pulses are 2+ on the right side, and 2+ on the left side.  Respiratory: No respiratory distress. She has no wheezes. She has no rhonchi. She has no rales.  GI: Soft. Bowel sounds are normal. There is no tenderness.  Musculoskeletal:       Right ankle: She exhibits no swelling.       Left ankle: She exhibits no swelling.  Lymphadenopathy:    She has no cervical adenopathy.  Neurological: She is alert. No cranial nerve deficit.   Skin: Skin is warm. No rash noted. Nails show no clubbing.  Psychiatric: She has a normal mood and affect.    Data Reviewed: Basic Metabolic Panel:  Recent Labs Lab 07/07/15 0937 07/08/15 0701 07/09/15 0451 07/09/15 1756 07/10/15 0430  NA 137 134* 135  --  135  K 4.2 4.0 2.8* 3.4* 3.9  CL 91* 86* 87*  --  95*  CO2 41* 44* 42*  --  37*  GLUCOSE 121* 97 99  --  93  BUN 14 10 8   --  15  CREATININE 0.65 0.55 0.51  --  0.61  CALCIUM 9.4 10.4* 9.7  --  8.8*  MG 1.4*  --  1.2* 2.2 1.9   Liver Function Tests:  Recent Labs Lab 07/08/15 0701  AST 19  ALT 8*  ALKPHOS 62  BILITOT 0.9  PROT 6.8  ALBUMIN 3.3*    Recent Labs Lab 07/08/15 0701  LIPASE 15   CBC:  Recent Labs Lab 07/07/15 0937 07/08/15 0701 07/09/15 0451  WBC 2.9* 6.1 3.4*  NEUTROABS 2.2 3.8  --   HGB 11.9* 12.1 12.9  HCT 35.4 36.6 38.0  MCV 102.6* 102.7* 99.9  PLT 83* 96* 75*     Recent Results (from the past 240 hour(s))  Urine culture     Status: None   Collection Time: 07/07/15  9:37 AM  Result Value Ref Range Status   Specimen Description URINE, RANDOM  Final   Special Requests NONE  Final   Culture MULTIPLE SPECIES PRESENT, SUGGEST RECOLLECTION  Final   Report Status 07/09/2015 FINAL  Final  Urine culture     Status: None   Collection Time: 07/08/15  7:01 AM  Result Value Ref Range Status   Specimen Description URINE, RANDOM  Final   Special Requests Normal  Final   Culture MULTIPLE SPECIES PRESENT, SUGGEST RECOLLECTION  Final   Report Status 07/10/2015 FINAL  Final      Scheduled Meds: . acidophilus   Oral Daily  . arformoterol  15 mcg Nebulization BID  . azithromycin  500 mg Oral Daily  . budesonide  0.25 mg Nebulization BID  . calcium-vitamin D  2 tablet Oral Daily  . cefTRIAXone (ROCEPHIN)  IV  1 g Intravenous Q24H  . cholecalciferol  800 Units Oral Daily  . diazepam  5 mg Oral QID  . docusate sodium  100 mg Oral BID  . DULoxetine  20 mg Oral Daily  . famotidine  10 mg  Oral Daily  . loratadine  10 mg Oral Daily  . oxycodone  30 mg Oral 6 times per day  . potassium chloride  20 mEq Oral BID  . tiotropium  18 mcg Inhalation Daily   Continuous Infusions: . 0.9 % NaCl with KCl 20 mEq / L 50 mL/hr at 07/10/15 1131    Assessment/Plan:  1. Acute encephalopathy. Improved 2. Acute cystitis without hematuria- so far urine culture is negative. Patient may have partially treated urinary tract infection with Cipro. The last 2 urine cultures showed multiple organisms and could have contamination. 3. Hypokalemia and hypomagnesemia- replace magnesium IV. We'll DC other IV fluids. 4. Weakness - PT recommended rehabilitation the patient wants to go home with home health 5. History of bladder cancer undergoing chemotherapy and radiation 6. History of constipation 7. Chronic pain syndrome continue outpatient medications 8. Tobacco abuse 9. Cough- patient already on Rocephin and Zithromax was added but I doubt pneumonia. 10. Chronic respiratory failure and COPD- respiratory status stable on oxygen  Code Status:     Code Status Orders        Start     Ordered   07/08/15 1216  Do not attempt resuscitation (DNR)   Continuous    Question Answer Comment  In the event of cardiac or respiratory ARREST Do not call a "code blue"   In the event of cardiac or respiratory ARREST Do not perform Intubation, CPR, defibrillation or ACLS   In the event of cardiac or respiratory ARREST Use medication by any route, position, wound care, and other measures to relive pain and suffering. May use oxygen, suction and manual treatment of airway obstruction as needed for comfort.      07/08/15 1215    [facility] Directive Documentation        Most Recent Value   Type of [facility] Directive  Living will   Pre-existing out of facility DNR order (yellow form or pink MOST form)     "MOST" Form in Place?       Family Communication: Family at bedside Disposition Plan: Home tomorrow  morning  Consultants:  Oncology  Pulmonary  Antibiotics:  Rocephin and Zithromax  Time spent: 20 minutes  [place], [place]

## 2015-07-10 NOTE — Telephone Encounter (Signed)
Spoke with husband and informed Anoro has been sent to pharmacy. Offered the patient assistance forms for Spiriva and Anoro per VM. Pt's husband refused stating that they have tried it once before and that they make to much money. Informed to call me back if he changed his mind. Nothing further needed.

## 2015-07-10 NOTE — Plan of Care (Signed)
Problem: Safety: Goal: Ability to remain free from injury will improve Outcome: Progressing Pt states understanding of calling with needs.  Bed in lowest position, call bell and phone within reach.

## 2015-07-10 NOTE — Plan of Care (Signed)
Problem: Physical Regulation: Goal: Will remain free from infection Outcome: Progressing Continues on IV antibiotics.

## 2015-07-10 NOTE — Care Management Important Message (Signed)
Important Message  Patient Details  Name: Rebecca Holland MRN: [id number] Date of Birth: 1951/07/29   Medicare Important Message Given:  Yes    [name] 07/10/2015, 11:18 AM

## 2015-07-10 NOTE — Plan of Care (Signed)
Problem: Education: Goal: Knowledge of treatment and prevention of UTI/Pyleonephritis will improve Outcome: Completed/Met Date Met:  07/10/15 Pt has a general understanding of condition and treatment of UTI

## 2015-07-11 ENCOUNTER — Inpatient Hospital Stay: Payer: Medicare Other

## 2015-07-11 ENCOUNTER — Other Ambulatory Visit: Payer: Self-pay

## 2015-07-11 ENCOUNTER — Other Ambulatory Visit: Payer: Self-pay | Admitting: Hematology and Oncology

## 2015-07-11 ENCOUNTER — Ambulatory Visit: Payer: Medicare Other

## 2015-07-11 DIAGNOSIS — C679 Malignant neoplasm of bladder, unspecified: Secondary | ICD-10-CM

## 2015-07-11 DIAGNOSIS — E876 Hypokalemia: Secondary | ICD-10-CM

## 2015-07-11 DIAGNOSIS — E871 Hypo-osmolality and hyponatremia: Secondary | ICD-10-CM

## 2015-07-11 LAB — MAGNESIUM: MAGNESIUM: 1.6 mg/dL — AB (ref 1.7–2.4)

## 2015-07-11 LAB — POTASSIUM: Potassium: 3.9 mmol/L (ref 3.5–5.1)

## 2015-07-11 MED ORDER — MAGNESIUM OXIDE 400 (241.3 MG) MG PO TABS: 400.0000 mg | ORAL_TABLET | Freq: Every day | ORAL | Status: DC

## 2015-07-11 MED ORDER — CYANOCOBALAMIN 1000 MCG PO TABS: 1000.0000 ug | ORAL_TABLET | Freq: Every day | ORAL | Status: DC

## 2015-07-11 MED ORDER — CYANOCOBALAMIN 1000 MCG/ML IJ SOLN
1000.0000 ug | Freq: Once | INTRAMUSCULAR | Status: AC
  Administered 2015-07-11: 1000 ug via INTRAMUSCULAR
  Filled 2015-07-11: qty 1

## 2015-07-11 MED ORDER — CEPHALEXIN 500 MG PO CAPS: 500.0000 mg | ORAL_CAPSULE | Freq: Three times a day (TID) | ORAL | Status: DC

## 2015-07-11 MED ORDER — VITAMIN B-12 1000 MCG PO TABS: 1000.0000 ug | ORAL_TABLET | Freq: Every day | ORAL | Status: DC

## 2015-07-11 MED ORDER — MAGNESIUM SULFATE 2 GM/50ML IV SOLN
2.0000 g | Freq: Once | INTRAVENOUS | Status: AC
  Administered 2015-07-11: 2 g via INTRAVENOUS
  Filled 2015-07-11: qty 50

## 2015-07-11 MED ORDER — POTASSIUM CHLORIDE CRYS ER 20 MEQ PO TBCR: 20.0000 meq | EXTENDED_RELEASE_TABLET | Freq: Every day | ORAL | Status: DC

## 2015-07-11 NOTE — Care Management (Signed)
Discharge to home today per Dr. Leslye Peer. Will be followed by [facility]. Famly/friend will transport home. Shelbie Ammons RN MSN CCM Care Management [phone]

## 2015-07-11 NOTE — Progress Notes (Signed)
Patient pain was well relieved by scheduled med  VSS MD order in to discharge patient to home, reviewed discharge instructions, prescriptions and home meds with patient and husband at both verbalized understanding, discharged to home followed with [place] at [facility]
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
## 2015-07-11 NOTE — Progress Notes (Signed)
Patient ID: Rebecca Holland, female   DOB: 19-Dec-1950, 64 y.o.   MRN: [id number]  I got a call from the daughter [name] at [phone] that she wanted me to workup the patient further with a neurology consultation speech therapy consultation for her altered mental status and speech issues.  I advised her that the patient had refused rehabilitation and wants to go home. That I have to touch base with her before canceling the discharge.  When I spoke with the patient she answered all questions appropriately over the phone. The patient stated that "my daughter is not to do this to me I'm going home today".  The patient does not want any further workup in the hospital and wants to go home.  Case discussed with the care manager.  Dr. Loletha Grayer.

## 2015-07-11 NOTE — Plan of Care (Signed)
Problem: Pain Managment: Goal: General experience of comfort will improve Outcome: Progressing Pt has chronic back pain and receives scheduled oxycodone and PRN dilaudid.
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
## 2015-07-12 ENCOUNTER — Inpatient Hospital Stay: Payer: Medicare Other

## 2015-07-12 ENCOUNTER — Ambulatory Visit: Payer: Medicare Other

## 2015-07-13 ENCOUNTER — Inpatient Hospital Stay: Payer: Medicare Other | Attending: Family Medicine

## 2015-07-13 ENCOUNTER — Ambulatory Visit: Payer: Medicare Other

## 2015-07-14 ENCOUNTER — Inpatient Hospital Stay: Payer: Medicare Other

## 2015-07-14 ENCOUNTER — Ambulatory Visit: Payer: Medicare Other

## 2015-07-17 ENCOUNTER — Encounter: Payer: Self-pay | Admitting: Emergency Medicine

## 2015-07-17 ENCOUNTER — Emergency Department: Payer: Medicare Other

## 2015-07-17 ENCOUNTER — Ambulatory Visit: Payer: Medicare Other

## 2015-07-17 ENCOUNTER — Other Ambulatory Visit: Payer: Self-pay | Admitting: *Deleted

## 2015-07-17 ENCOUNTER — Inpatient Hospital Stay
Admission: EM | Admit: 2015-07-17 | Discharge: 2015-07-21 | DRG: 193 | Disposition: A | Discharge status: Home Health | Payer: Medicare Other | Attending: Specialist | Admitting: Specialist

## 2015-07-17 ENCOUNTER — Telehealth: Payer: Self-pay

## 2015-07-17 DIAGNOSIS — Z91040 Latex allergy status: Secondary | ICD-10-CM

## 2015-07-17 DIAGNOSIS — J441 Chronic obstructive pulmonary disease with (acute) exacerbation: Secondary | ICD-10-CM | POA: Diagnosis present

## 2015-07-17 DIAGNOSIS — R400 Somnolence: Secondary | ICD-10-CM

## 2015-07-17 DIAGNOSIS — Y95 Nosocomial condition: Secondary | ICD-10-CM

## 2015-07-17 DIAGNOSIS — N390 Urinary tract infection, site not specified: Secondary | ICD-10-CM | POA: Diagnosis present

## 2015-07-17 DIAGNOSIS — F17210 Nicotine dependence, cigarettes, uncomplicated: Secondary | ICD-10-CM | POA: Diagnosis present

## 2015-07-17 DIAGNOSIS — J9622 Acute and chronic respiratory failure with hypercapnia: Secondary | ICD-10-CM | POA: Diagnosis present

## 2015-07-17 DIAGNOSIS — G9341 Metabolic encephalopathy: Secondary | ICD-10-CM | POA: Diagnosis present

## 2015-07-17 DIAGNOSIS — D72819 Decreased white blood cell count, unspecified: Secondary | ICD-10-CM | POA: Diagnosis present

## 2015-07-17 DIAGNOSIS — R0689 Other abnormalities of breathing: Secondary | ICD-10-CM

## 2015-07-17 DIAGNOSIS — Z79899 Other long term (current) drug therapy: Secondary | ICD-10-CM

## 2015-07-17 DIAGNOSIS — G8929 Other chronic pain: Secondary | ICD-10-CM | POA: Diagnosis present

## 2015-07-17 DIAGNOSIS — I129 Hypertensive chronic kidney disease with stage 1 through stage 4 chronic kidney disease, or unspecified chronic kidney disease: Secondary | ICD-10-CM | POA: Diagnosis present

## 2015-07-17 DIAGNOSIS — C679 Malignant neoplasm of bladder, unspecified: Secondary | ICD-10-CM | POA: Diagnosis present

## 2015-07-17 DIAGNOSIS — E7220 Disorder of urea cycle metabolism, unspecified: Secondary | ICD-10-CM

## 2015-07-17 DIAGNOSIS — M1990 Unspecified osteoarthritis, unspecified site: Secondary | ICD-10-CM | POA: Diagnosis present

## 2015-07-17 DIAGNOSIS — N189 Chronic kidney disease, unspecified: Secondary | ICD-10-CM | POA: Diagnosis present

## 2015-07-17 DIAGNOSIS — Z79891 Long term (current) use of opiate analgesic: Secondary | ICD-10-CM

## 2015-07-17 DIAGNOSIS — J9621 Acute and chronic respiratory failure with hypoxia: Secondary | ICD-10-CM | POA: Diagnosis present

## 2015-07-17 DIAGNOSIS — R410 Disorientation, unspecified: Secondary | ICD-10-CM | POA: Diagnosis present

## 2015-07-17 DIAGNOSIS — M797 Fibromyalgia: Secondary | ICD-10-CM | POA: Diagnosis present

## 2015-07-17 DIAGNOSIS — Z9981 Dependence on supplemental oxygen: Secondary | ICD-10-CM

## 2015-07-17 DIAGNOSIS — E876 Hypokalemia: Secondary | ICD-10-CM | POA: Diagnosis present

## 2015-07-17 DIAGNOSIS — Z882 Allergy status to sulfonamides status: Secondary | ICD-10-CM

## 2015-07-17 DIAGNOSIS — F329 Major depressive disorder, single episode, unspecified: Secondary | ICD-10-CM | POA: Diagnosis present

## 2015-07-17 DIAGNOSIS — J189 Pneumonia, unspecified organism: Secondary | ICD-10-CM | POA: Diagnosis not present

## 2015-07-17 DIAGNOSIS — J45909 Unspecified asthma, uncomplicated: Secondary | ICD-10-CM | POA: Diagnosis present

## 2015-07-17 DIAGNOSIS — W010XXA Fall on same level from slipping, tripping and stumbling without subsequent striking against object, initial encounter: Secondary | ICD-10-CM | POA: Diagnosis present

## 2015-07-17 DIAGNOSIS — K219 Gastro-esophageal reflux disease without esophagitis: Secondary | ICD-10-CM | POA: Diagnosis present

## 2015-07-17 DIAGNOSIS — Z888 Allergy status to other drugs, medicaments and biological substances status: Secondary | ICD-10-CM

## 2015-07-17 MED ORDER — SODIUM CHLORIDE 0.9 % IV BOLUS (SEPSIS)
1000.0000 mL | Freq: Once | INTRAVENOUS | Status: AC
  Administered 2015-07-18: 1000 mL via INTRAVENOUS

## 2015-07-17 MED ORDER — FLUCONAZOLE 100 MG PO TABS: 100.0000 mg | ORAL_TABLET | Freq: Every day | ORAL | Status: DC

## 2015-07-17 NOTE — Telephone Encounter (Signed)
Pt's husband called today to give [place] an update on pt.  Per husband pt did well after recent stay in hospital and was feeling better up until yesterday which was Sunday 12/4.  Per husband pt reports having diarreah on 12/5 and per husband she took something but does not know what.  I asked husband if there was anything we could do and per husband no.  I asked husband if they planned to keep appt for 12/9 and he stated they were going to try.  He reported that pt just states "I just feel tired".  I told husband to please call and let us know if they need anything.  He verbalized an understanding and expressed a thanks.
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
## 2015-07-17 NOTE — ED Notes (Signed)
Pt to ED via EMS from home c/o fall.  Per EMS pt had witnessed fall tonight around 2115 by husband.

## 2015-07-18 ENCOUNTER — Telehealth: Payer: Self-pay | Admitting: *Deleted

## 2015-07-18 ENCOUNTER — Ambulatory Visit: Payer: Medicare Other

## 2015-07-18 ENCOUNTER — Emergency Department: Payer: Medicare Other

## 2015-07-18 DIAGNOSIS — M1990 Unspecified osteoarthritis, unspecified site: Secondary | ICD-10-CM | POA: Diagnosis present

## 2015-07-18 DIAGNOSIS — J9622 Acute and chronic respiratory failure with hypercapnia: Secondary | ICD-10-CM | POA: Diagnosis present

## 2015-07-18 DIAGNOSIS — Z79899 Other long term (current) drug therapy: Secondary | ICD-10-CM | POA: Diagnosis not present

## 2015-07-18 DIAGNOSIS — W010XXA Fall on same level from slipping, tripping and stumbling without subsequent striking against object, initial encounter: Secondary | ICD-10-CM | POA: Diagnosis present

## 2015-07-18 DIAGNOSIS — Z882 Allergy status to sulfonamides status: Secondary | ICD-10-CM | POA: Diagnosis not present

## 2015-07-18 DIAGNOSIS — N390 Urinary tract infection, site not specified: Secondary | ICD-10-CM | POA: Diagnosis present

## 2015-07-18 DIAGNOSIS — F17210 Nicotine dependence, cigarettes, uncomplicated: Secondary | ICD-10-CM | POA: Diagnosis present

## 2015-07-18 DIAGNOSIS — D72819 Decreased white blood cell count, unspecified: Secondary | ICD-10-CM | POA: Diagnosis present

## 2015-07-18 DIAGNOSIS — J189 Pneumonia, unspecified organism: Secondary | ICD-10-CM | POA: Diagnosis present

## 2015-07-18 DIAGNOSIS — Z79891 Long term (current) use of opiate analgesic: Secondary | ICD-10-CM | POA: Diagnosis not present

## 2015-07-18 DIAGNOSIS — G9341 Metabolic encephalopathy: Secondary | ICD-10-CM | POA: Diagnosis present

## 2015-07-18 DIAGNOSIS — M797 Fibromyalgia: Secondary | ICD-10-CM | POA: Diagnosis present

## 2015-07-18 DIAGNOSIS — G8929 Other chronic pain: Secondary | ICD-10-CM | POA: Diagnosis present

## 2015-07-18 DIAGNOSIS — C679 Malignant neoplasm of bladder, unspecified: Secondary | ICD-10-CM | POA: Diagnosis present

## 2015-07-18 DIAGNOSIS — E876 Hypokalemia: Secondary | ICD-10-CM | POA: Diagnosis present

## 2015-07-18 DIAGNOSIS — J45909 Unspecified asthma, uncomplicated: Secondary | ICD-10-CM | POA: Diagnosis present

## 2015-07-18 DIAGNOSIS — Z888 Allergy status to other drugs, medicaments and biological substances status: Secondary | ICD-10-CM | POA: Diagnosis not present

## 2015-07-18 DIAGNOSIS — Z9981 Dependence on supplemental oxygen: Secondary | ICD-10-CM | POA: Diagnosis not present

## 2015-07-18 DIAGNOSIS — I129 Hypertensive chronic kidney disease with stage 1 through stage 4 chronic kidney disease, or unspecified chronic kidney disease: Secondary | ICD-10-CM | POA: Diagnosis present

## 2015-07-18 DIAGNOSIS — R410 Disorientation, unspecified: Secondary | ICD-10-CM | POA: Diagnosis present

## 2015-07-18 DIAGNOSIS — K219 Gastro-esophageal reflux disease without esophagitis: Secondary | ICD-10-CM | POA: Diagnosis present

## 2015-07-18 DIAGNOSIS — J441 Chronic obstructive pulmonary disease with (acute) exacerbation: Secondary | ICD-10-CM | POA: Diagnosis present

## 2015-07-18 DIAGNOSIS — N189 Chronic kidney disease, unspecified: Secondary | ICD-10-CM | POA: Diagnosis present

## 2015-07-18 DIAGNOSIS — E7220 Disorder of urea cycle metabolism, unspecified: Secondary | ICD-10-CM | POA: Diagnosis present

## 2015-07-18 DIAGNOSIS — Z91040 Latex allergy status: Secondary | ICD-10-CM | POA: Diagnosis not present

## 2015-07-18 DIAGNOSIS — Y95 Nosocomial condition: Secondary | ICD-10-CM | POA: Diagnosis present

## 2015-07-18 DIAGNOSIS — F329 Major depressive disorder, single episode, unspecified: Secondary | ICD-10-CM | POA: Diagnosis present

## 2015-07-18 DIAGNOSIS — J9621 Acute and chronic respiratory failure with hypoxia: Secondary | ICD-10-CM | POA: Diagnosis present

## 2015-07-18 LAB — COMPREHENSIVE METABOLIC PANEL
ALBUMIN: 3.3 g/dL — AB (ref 3.5–5.0)
ALK PHOS: 68 U/L (ref 38–126)
ALT: 9 U/L — ABNORMAL LOW (ref 14–54)
ANION GAP: 3 — AB (ref 5–15)
AST: 13 U/L — AB (ref 15–41)
BUN: 13 mg/dL (ref 6–20)
CALCIUM: 9.8 mg/dL (ref 8.9–10.3)
CO2: 42 mmol/L — AB (ref 22–32)
Chloride: 92 mmol/L — ABNORMAL LOW (ref 101–111)
Creatinine, Ser: 0.72 mg/dL (ref 0.44–1.00)
GFR calc Af Amer: >60 mL/min (ref >60)
GFR calc non Af Amer: >60 mL/min (ref >60)
GLUCOSE: 119 mg/dL — AB (ref 65–99)
Potassium: 4.6 mmol/L (ref 3.5–5.1)
SODIUM: 137 mmol/L (ref 135–145)
Total Bilirubin: 0.4 mg/dL (ref 0.3–1.2)
Total Protein: 6.4 g/dL — ABNORMAL LOW (ref 6.5–8.1)

## 2015-07-18 LAB — URINALYSIS COMPLETE WITH MICROSCOPIC (ARMC ONLY)
BACTERIA UA: NONE SEEN
BILIRUBIN URINE: NEGATIVE
Bacteria, UA: NONE SEEN
Bilirubin Urine: NEGATIVE
GLUCOSE, UA: NEGATIVE mg/dL
Glucose, UA: 50 mg/dL — AB
Hgb urine dipstick: NEGATIVE
Ketones, ur: NEGATIVE mg/dL
Ketones, ur: NEGATIVE mg/dL
Nitrite: NEGATIVE
Nitrite: NEGATIVE
PH: 7 (ref 5.0–8.0)
PROTEIN: 100 mg/dL — AB
Protein, ur: NEGATIVE mg/dL
SPECIFIC GRAVITY, URINE: 1.017 (ref 1.005–1.030)
Specific Gravity, Urine: 1.006 (ref 1.005–1.030)
pH: 6 (ref 5.0–8.0)

## 2015-07-18 LAB — CBC WITH DIFFERENTIAL/PLATELET
BASOS ABS: 0 10*3/uL (ref 0–0.1)
EOS ABS: 0 10*3/uL (ref 0–0.7)
Eosinophils Relative: 1 %
HCT: 32.5 % — ABNORMAL LOW (ref 35.0–47.0)
HEMOGLOBIN: 11 g/dL — AB (ref 12.0–16.0)
Lymphocytes Relative: 15 %
Lymphs Abs: 0.5 10*3/uL — ABNORMAL LOW (ref 1.0–3.6)
MCH: 34.5 pg — ABNORMAL HIGH (ref 26.0–34.0)
MCHC: 33.8 g/dL (ref 32.0–36.0)
MCV: 101.8 fL — ABNORMAL HIGH (ref 80.0–100.0)
Monocytes Absolute: 0.4 10*3/uL (ref 0.2–0.9)
Monocytes Relative: 13 %
NEUTROS ABS: 2.3 10*3/uL (ref 1.4–6.5)
Platelets: 59 10*3/uL — ABNORMAL LOW (ref 150–440)
RBC: 3.19 MIL/uL — AB (ref 3.80–5.20)
RDW: 19.2 % — ABNORMAL HIGH (ref 11.5–14.5)
WBC: 3.3 10*3/uL — AB (ref 3.6–11.0)

## 2015-07-18 LAB — BLOOD GAS, ARTERIAL
ALLENS TEST (PASS/FAIL): POSITIVE — AB
Acid-Base Excess: 10.9 mmol/L — ABNORMAL HIGH (ref 0.0–3.0)
Acid-Base Excess: 8.8 mmol/L — ABNORMAL HIGH (ref 0.0–3.0)
Allens test (pass/fail): POSITIVE — AB
BICARBONATE: 41.5 meq/L — AB (ref 21.0–28.0)
Bicarbonate: 36.4 mEq/L — ABNORMAL HIGH (ref 21.0–28.0)
FIO2: 0.28
FIO2: 0.36
O2 SAT: 83.4 %
O2 Saturation: 85.2 %
PATIENT TEMPERATURE: 37
PCO2 ART: 99 mmHg — AB (ref 32.0–48.0)
PO2 ART: 57 mmHg — AB (ref 83.0–108.0)
Patient temperature: 37
pCO2 arterial: 69 mmHg — ABNORMAL HIGH (ref 32.0–48.0)
pH, Arterial: 7.23 — ABNORMAL LOW (ref 7.350–7.450)
pH, Arterial: 7.33 — ABNORMAL LOW (ref 7.350–7.450)
pO2, Arterial: 54 mmHg — ABNORMAL LOW (ref 83.0–108.0)

## 2015-07-18 LAB — TROPONIN I
TROPONIN I: 0.05 ng/mL — AB (ref <0.031)
Troponin I: 0.08 ng/mL — ABNORMAL HIGH (ref <0.031)
Troponin I: 0.07 ng/mL — ABNORMAL HIGH (ref <0.031)
Troponin I: 0.04 ng/mL — ABNORMAL HIGH (ref <0.031)

## 2015-07-18 LAB — TSH: TSH: 0.53 u[IU]/mL (ref 0.350–4.500)

## 2015-07-18 LAB — AMMONIA: AMMONIA: 48 umol/L — AB (ref 9–35)

## 2015-07-18 LAB — MRSA PCR SCREENING: MRSA by PCR: NEGATIVE

## 2015-07-18 LAB — LACTIC ACID, PLASMA
LACTIC ACID, VENOUS: 0.5 mmol/L (ref 0.5–2.0)
LACTIC ACID, VENOUS: 0.5 mmol/L (ref 0.5–2.0)

## 2015-07-18 MED ORDER — ALBUTEROL SULFATE (2.5 MG/3ML) 0.083% IN NEBU: 3.0000 mL | INHALATION_SOLUTION | Freq: Four times a day (QID) | RESPIRATORY_TRACT | Status: DC | PRN

## 2015-07-18 MED ORDER — URIBEL 118 MG PO CAPS: 1.0000 | ORAL_CAPSULE | Freq: Four times a day (QID) | ORAL | Status: DC | PRN

## 2015-07-18 MED ORDER — METHYLPREDNISOLONE SODIUM SUCC 125 MG IJ SOLR
125.0000 mg | Freq: Once | INTRAMUSCULAR | Status: AC
  Administered 2015-07-18: 125 mg via INTRAVENOUS
  Filled 2015-07-18: qty 2

## 2015-07-18 MED ORDER — IPRATROPIUM-ALBUTEROL 0.5-2.5 (3) MG/3ML IN SOLN
3.0000 mL | Freq: Four times a day (QID) | RESPIRATORY_TRACT | Status: DC
  Administered 2015-07-18 – 2015-07-20 (×7): 3 mL via RESPIRATORY_TRACT
  Filled 2015-07-18 (×8): qty 3

## 2015-07-18 MED ORDER — PREDNISONE 10 MG PO TABS: 10.0000 mg | ORAL_TABLET | Freq: Once | ORAL | Status: DC

## 2015-07-18 MED ORDER — TIOTROPIUM BROMIDE MONOHYDRATE 18 MCG IN CAPS
18.0000 ug | ORAL_CAPSULE | Freq: Every day | RESPIRATORY_TRACT | Status: DC
  Administered 2015-07-18 – 2015-07-21 (×4): 18 ug via RESPIRATORY_TRACT
  Filled 2015-07-18: qty 5

## 2015-07-18 MED ORDER — LIDOCAINE-PRILOCAINE 2.5-2.5 % EX CREA
1.0000 "application " | TOPICAL_CREAM | CUTANEOUS | Status: DC | PRN
  Filled 2015-07-18: qty 5

## 2015-07-18 MED ORDER — FAMOTIDINE 20 MG PO TABS
20.0000 mg | ORAL_TABLET | Freq: Every day | ORAL | Status: DC
  Administered 2015-07-18 – 2015-07-21 (×4): 20 mg via ORAL
  Filled 2015-07-18 (×4): qty 1

## 2015-07-18 MED ORDER — MORPHINE SULFATE (PF) 2 MG/ML IV SOLN
2.0000 mg | INTRAVENOUS | Status: DC | PRN
  Administered 2015-07-18: 2 mg via INTRAVENOUS
  Filled 2015-07-18 (×2): qty 1

## 2015-07-18 MED ORDER — OXYCODONE HCL 5 MG PO TABS
30.0000 mg | ORAL_TABLET | ORAL | Status: DC | PRN
  Administered 2015-07-18 – 2015-07-21 (×12): 30 mg via ORAL
  Filled 2015-07-18 (×12): qty 6

## 2015-07-18 MED ORDER — ACETAMINOPHEN 650 MG RE SUPP: 650.0000 mg | Freq: Four times a day (QID) | RECTAL | Status: DC | PRN

## 2015-07-18 MED ORDER — DIAZEPAM 5 MG PO TABS
5.0000 mg | ORAL_TABLET | Freq: Three times a day (TID) | ORAL | Status: DC | PRN
  Administered 2015-07-18 – 2015-07-21 (×4): 5 mg via ORAL
  Filled 2015-07-18: qty 3
  Filled 2015-07-18 (×3): qty 1

## 2015-07-18 MED ORDER — VANCOMYCIN HCL IN DEXTROSE 1-5 GM/200ML-% IV SOLN
1000.0000 mg | Freq: Two times a day (BID) | INTRAVENOUS | Status: DC
  Administered 2015-07-18 – 2015-07-20 (×5): 1000 mg via INTRAVENOUS
  Filled 2015-07-18 (×5): qty 200

## 2015-07-18 MED ORDER — SODIUM CHLORIDE 0.9 % IV SOLN
INTRAVENOUS | Status: DC
  Administered 2015-07-18: 125 mL/h via INTRAVENOUS
  Administered 2015-07-18 – 2015-07-19 (×2): via INTRAVENOUS

## 2015-07-18 MED ORDER — POTASSIUM CHLORIDE CRYS ER 20 MEQ PO TBCR
20.0000 meq | EXTENDED_RELEASE_TABLET | Freq: Every day | ORAL | Status: DC
  Administered 2015-07-18 – 2015-07-21 (×4): 20 meq via ORAL
  Filled 2015-07-18 (×4): qty 1

## 2015-07-18 MED ORDER — DULOXETINE HCL 20 MG PO CPEP
20.0000 mg | ORAL_CAPSULE | Freq: Every day | ORAL | Status: DC
  Administered 2015-07-18: 20 mg via ORAL
  Filled 2015-07-18: qty 1

## 2015-07-18 MED ORDER — ACETAMINOPHEN 325 MG PO TABS
650.0000 mg | ORAL_TABLET | Freq: Four times a day (QID) | ORAL | Status: DC | PRN
  Administered 2015-07-19: 650 mg via ORAL
  Filled 2015-07-18: qty 2

## 2015-07-18 MED ORDER — PROMETHAZINE HCL 25 MG PO TABS: 12.5000 mg | ORAL_TABLET | Freq: Four times a day (QID) | ORAL | Status: DC | PRN

## 2015-07-18 MED ORDER — FLUCONAZOLE 100 MG PO TABS
100.0000 mg | ORAL_TABLET | Freq: Every day | ORAL | Status: DC
  Administered 2015-07-18 – 2015-07-21 (×4): 100 mg via ORAL
  Filled 2015-07-18 (×4): qty 1

## 2015-07-18 MED ORDER — MAGIC MOUTHWASH: 5.0000 mL | Freq: Four times a day (QID) | ORAL | Status: DC | PRN

## 2015-07-18 MED ORDER — MOMETASONE FURO-FORMOTEROL FUM 200-5 MCG/ACT IN AERO
2.0000 | INHALATION_SPRAY | Freq: Two times a day (BID) | RESPIRATORY_TRACT | Status: DC
  Administered 2015-07-18 – 2015-07-21 (×7): 2 via RESPIRATORY_TRACT
  Filled 2015-07-18: qty 8.8

## 2015-07-18 MED ORDER — DOCUSATE SODIUM 100 MG PO CAPS
100.0000 mg | ORAL_CAPSULE | Freq: Two times a day (BID) | ORAL | Status: DC
  Administered 2015-07-18 – 2015-07-21 (×6): 100 mg via ORAL
  Filled 2015-07-18 (×7): qty 1

## 2015-07-18 MED ORDER — RISAQUAD PO CAPS
1.0000 | ORAL_CAPSULE | Freq: Every day | ORAL | Status: DC
  Administered 2015-07-18 – 2015-07-21 (×4): 1 via ORAL
  Filled 2015-07-18 (×4): qty 1

## 2015-07-18 MED ORDER — LEVOFLOXACIN IN D5W 750 MG/150ML IV SOLN
750.0000 mg | INTRAVENOUS | Status: DC
  Administered 2015-07-18: 750 mg via INTRAVENOUS
  Filled 2015-07-18 (×2): qty 150

## 2015-07-18 MED ORDER — CYCLOBENZAPRINE HCL 10 MG PO TABS
10.0000 mg | ORAL_TABLET | Freq: Two times a day (BID) | ORAL | Status: DC
  Administered 2015-07-18 – 2015-07-21 (×7): 10 mg via ORAL
  Filled 2015-07-18 (×7): qty 1

## 2015-07-18 MED ORDER — DOCUSATE SODIUM 50 MG PO CAPS
50.0000 mg | ORAL_CAPSULE | Freq: Two times a day (BID) | ORAL | Status: DC
  Filled 2015-07-18 (×2): qty 1

## 2015-07-18 MED ORDER — LEVOFLOXACIN IN D5W 750 MG/150ML IV SOLN
750.0000 mg | Freq: Once | INTRAVENOUS | Status: AC
  Administered 2015-07-18: 750 mg via INTRAVENOUS
  Filled 2015-07-18: qty 150

## 2015-07-18 MED ORDER — URELLE 81 MG PO TABS
1.0000 | ORAL_TABLET | Freq: Four times a day (QID) | ORAL | Status: DC | PRN
  Filled 2015-07-18: qty 1

## 2015-07-18 MED ORDER — PREDNISONE 20 MG PO TABS
50.0000 mg | ORAL_TABLET | Freq: Once | ORAL | Status: AC
  Administered 2015-07-18: 50 mg via ORAL
  Filled 2015-07-18: qty 2

## 2015-07-18 MED ORDER — PREDNISONE 5 MG PO TABS: 5.0000 mg | ORAL_TABLET | Freq: Once | ORAL | Status: DC

## 2015-07-18 MED ORDER — POLYETHYLENE GLYCOL 3350 17 G PO PACK
1.0000 | PACK | Freq: Two times a day (BID) | ORAL | Status: DC
  Administered 2015-07-18: 17 g via ORAL
  Filled 2015-07-18 (×7): qty 1

## 2015-07-18 MED ORDER — CALCIUM CARBONATE-VITAMIN D 500-200 MG-UNIT PO TABS
1.0000 | ORAL_TABLET | Freq: Every day | ORAL | Status: DC
  Administered 2015-07-18 – 2015-07-21 (×4): 1 via ORAL
  Filled 2015-07-18 (×4): qty 1

## 2015-07-18 MED ORDER — VITAMIN B-12 1000 MCG PO TABS
1000.0000 ug | ORAL_TABLET | Freq: Every day | ORAL | Status: DC
  Administered 2015-07-18 – 2015-07-21 (×4): 1000 ug via ORAL
  Filled 2015-07-18 (×4): qty 1

## 2015-07-18 MED ORDER — CHOLECALCIFEROL 10 MCG (400 UNIT) PO TABS
800.0000 [IU] | ORAL_TABLET | Freq: Every day | ORAL | Status: DC
  Administered 2015-07-18 – 2015-07-21 (×4): 800 [IU] via ORAL
  Filled 2015-07-18 (×4): qty 2

## 2015-07-18 MED ORDER — HEPARIN SODIUM (PORCINE) 5000 UNIT/ML IJ SOLN
5000.0000 [IU] | Freq: Three times a day (TID) | INTRAMUSCULAR | Status: DC
  Administered 2015-07-18 – 2015-07-19 (×4): 5000 [IU] via SUBCUTANEOUS
  Filled 2015-07-18 (×4): qty 1

## 2015-07-18 MED ORDER — MAGNESIUM OXIDE 400 (241.3 MG) MG PO TABS
400.0000 mg | ORAL_TABLET | Freq: Every day | ORAL | Status: DC
  Administered 2015-07-18 – 2015-07-21 (×4): 400 mg via ORAL
  Filled 2015-07-18 (×4): qty 1

## 2015-07-18 MED ORDER — HYOSCYAMINE SULFATE 0.125 MG PO TABS
0.1250 mg | ORAL_TABLET | ORAL | Status: DC | PRN
  Filled 2015-07-18: qty 1

## 2015-07-18 MED ORDER — PREDNISONE 20 MG PO TABS
20.0000 mg | ORAL_TABLET | Freq: Once | ORAL | Status: AC
  Administered 2015-07-21: 20 mg via ORAL
  Filled 2015-07-18: qty 1

## 2015-07-18 MED ORDER — VANCOMYCIN HCL IN DEXTROSE 1-5 GM/200ML-% IV SOLN
1000.0000 mg | Freq: Once | INTRAVENOUS | Status: AC
  Administered 2015-07-18: 1000 mg via INTRAVENOUS
  Filled 2015-07-18: qty 200

## 2015-07-18 MED ORDER — PREDNISONE 20 MG PO TABS
40.0000 mg | ORAL_TABLET | Freq: Once | ORAL | Status: AC
  Administered 2015-07-19: 40 mg via ORAL
  Filled 2015-07-18: qty 2

## 2015-07-18 MED ORDER — SODIUM CHLORIDE 0.9 % IJ SOLN
3.0000 mL | Freq: Two times a day (BID) | INTRAMUSCULAR | Status: DC
  Administered 2015-07-19 – 2015-07-21 (×5): 3 mL via INTRAVENOUS

## 2015-07-18 MED ORDER — CETYLPYRIDINIUM CHLORIDE 0.05 % MT LIQD
7.0000 mL | Freq: Two times a day (BID) | OROMUCOSAL | Status: DC
Start: 2015-07-18 — End: 2015-07-21
  Administered 2015-07-18 – 2015-07-21 (×4): 7 mL via OROMUCOSAL

## 2015-07-18 MED ORDER — PREDNISONE 10 MG PO TABS
30.0000 mg | ORAL_TABLET | Freq: Once | ORAL | Status: AC
  Administered 2015-07-20: 30 mg via ORAL
  Filled 2015-07-18: qty 1

## 2015-07-18 MED ORDER — LORATADINE 10 MG PO TABS
10.0000 mg | ORAL_TABLET | Freq: Every day | ORAL | Status: DC
  Administered 2015-07-18 – 2015-07-21 (×4): 10 mg via ORAL
  Filled 2015-07-18 (×4): qty 1

## 2015-07-18 MED ORDER — DIPHENOXYLATE-ATROPINE 2.5-0.025 MG PO TABS: 1.0000 | ORAL_TABLET | Freq: Four times a day (QID) | ORAL | Status: DC | PRN

## 2015-07-18 MED ORDER — PSYLLIUM 95 % PO PACK
1.0000 | PACK | ORAL | Status: DC
  Filled 2015-07-18 (×2): qty 1
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
## 2015-07-18 NOTE — Progress Notes (Signed)
Pt wore BiPAP most of the day, continues to be confused and c/o pain. Pt receiving PO Oxycodone 30mg , PRN and reports relief, VSS, PAC accessed, and foley placed. Pts family at bedside most of the day and updaed by MD. No additional questions or concerns at this time. Will continue to monitor.

## 2015-07-18 NOTE — Progress Notes (Signed)
[facility]  Patient Status: active  [facility] is providing the following services: SN/PT, never seen for ST or HHA  If patient discharges after hours, please call [phone].   Rebecca Holland 07/18/2015, 11:52 AM
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
## 2015-07-18 NOTE — Care Management (Signed)
Patient admitted from home..  Placed on bipapp and admitted to icu for close monitoring.  Patient with recent discharge home from [facility] 11/29 [facility] Services that have opened are nursing and physical therapy.  has not been seen by aide or ST.     Patient does have chronic home 02.  Unable to determine if patient/family adjusting 02 iter flow.  Her PC02 was 99 on admission. It had been recommended that patient goes to skilled nursing facility at last discharge but patient wanted to go home.  She remains on bipapp

## 2015-07-18 NOTE — Progress Notes (Signed)
eLink Physician-Brief Progress Note Patient Name: Rebecca Holland DOB: June 06, 1951 MRN: [id number]   Date of Service  07/18/2015  HPI/Events of Note  Hypercarbic respiratory failure BIPAP, FiO2 50% comfortable  eICU Interventions  No eICU intervention     Intervention Category Evaluation Type: New Patient Evaluation  Rebecca Holland 07/18/2015, 3:29 AM

## 2015-07-18 NOTE — Telephone Encounter (Signed)
Called to report that patient is in ICU at [facility]
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
## 2015-07-18 NOTE — Progress Notes (Signed)
Pt confused and very agitated. Pt pulling off BiPAP and attempting to pull out lines. Pt reoriented and reassured of safety. Nasal cannula at 4L placed on pt and RT notified of ABG needed. Will continue to monitor co2 and mental status.

## 2015-07-19 ENCOUNTER — Ambulatory Visit: Payer: Medicare Other

## 2015-07-19 LAB — BLOOD GAS, ARTERIAL
ACID-BASE EXCESS: 10.4 mmol/L — AB (ref 0.0–3.0)
BICARBONATE: 37.4 meq/L — AB (ref 21.0–28.0)
FIO2: 0.4
O2 Saturation: 96 %
PATIENT TEMPERATURE: 37
pCO2 arterial: 59 mmHg — ABNORMAL HIGH (ref 32.0–48.0)
pH, Arterial: 7.41 (ref 7.350–7.450)
pO2, Arterial: 81 mmHg — ABNORMAL LOW (ref 83.0–108.0)

## 2015-07-19 LAB — BASIC METABOLIC PANEL
ANION GAP: 1 — AB (ref 5–15)
BUN: 12 mg/dL (ref 6–20)
CALCIUM: 8.7 mg/dL — AB (ref 8.9–10.3)
CO2: 35 mmol/L — AB (ref 22–32)
Chloride: 101 mmol/L (ref 101–111)
Creatinine, Ser: 0.61 mg/dL (ref 0.44–1.00)
Glucose, Bld: 94 mg/dL (ref 65–99)
Potassium: 4 mmol/L (ref 3.5–5.1)
Sodium: 137 mmol/L (ref 135–145)

## 2015-07-19 LAB — CBC
HEMATOCRIT: 27.2 % — AB (ref 35.0–47.0)
Hemoglobin: 9.4 g/dL — ABNORMAL LOW (ref 12.0–16.0)
MCH: 35 pg — ABNORMAL HIGH (ref 26.0–34.0)
MCHC: 34.4 g/dL (ref 32.0–36.0)
MCV: 101.9 fL — ABNORMAL HIGH (ref 80.0–100.0)
Platelets: 47 10*3/uL — ABNORMAL LOW (ref 150–440)
RBC: 2.67 MIL/uL — AB (ref 3.80–5.20)
RDW: 19.5 % — AB (ref 11.5–14.5)
WBC: 3 10*3/uL — AB (ref 3.6–11.0)

## 2015-07-19 LAB — AMMONIA: Ammonia: 22 umol/L (ref 9–35)

## 2015-07-19 MED ORDER — LEVOFLOXACIN 750 MG PO TABS
750.0000 mg | ORAL_TABLET | Freq: Every day | ORAL | Status: DC
  Administered 2015-07-19 – 2015-07-21 (×3): 750 mg via ORAL
  Filled 2015-07-19: qty 1
  Filled 2015-07-19: qty 2
  Filled 2015-07-19: qty 1

## 2015-07-19 MED ORDER — DULOXETINE HCL 20 MG PO CPEP
20.0000 mg | ORAL_CAPSULE | Freq: Every day | ORAL | Status: DC
  Administered 2015-07-19 – 2015-07-20 (×2): 20 mg via ORAL
  Filled 2015-07-19 (×2): qty 1
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
## 2015-07-19 NOTE — Progress Notes (Signed)
Patient transferred to floor. A@O . No c/o pain no distress noted. 02 at 3l, lungs diminished. Telemetry reading nsr 87. Bed alarm in use, side rails up x2. Will continue to monitor
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
## 2015-07-19 NOTE — Clinical Documentation Improvement (Signed)
Internal Medicine  Can the diagnosis of altered mental status, acute delirium be further specified? Please update your documentation within the medical record (Progress Note) to reflect your response to this query. Do not document in BPA drop down box. Thank you!   Transient altered status  Acute delirium as documented  Drug induced altered mental status  Metabolic Encephalopathy  Other  Clinically Undetermined  Document any associated diagnoses/conditions.  Supporting Information:  ABG's reveal hypercarbia (99, 69) and hypoxia (54, 57)  Ammonia level was 48 on admission  Please exercise your independent, professional judgment when responding. A specific answer is not anticipated or expected.  Thank You,  Zoila Shutter RN, BSN, [place] [phone]; Cell: [phone]

## 2015-07-19 NOTE — Progress Notes (Signed)
PHARMACIST - PHYSICIAN COMMUNICATION DR:   Volanda Napoleon CONCERNING: Antibiotic IV to Oral Route Change Policy  RECOMMENDATION: This patient is receiving Levaquin by the intravenous route.  Based on criteria approved by the Pharmacy and Therapeutics Committee, the antibiotic(s) is/are being converted to the equivalent oral dose form(s).   DESCRIPTION: These criteria include:  Patient being treated for a respiratory tract infection, urinary tract infection, cellulitis or clostridium difficile associated diarrhea if on metronidazole  The patient is not neutropenic and does not exhibit a GI malabsorption state  The patient is eating (either orally or via tube) and/or has been taking other orally administered medications for a least 24 hours  The patient is improving clinically and has a Tmax < 100.5  If you have questions about this conversion, please contact the Pharmacy Department  []   [phone] )  [facility] [x]   [phone] )  [facility] []   [phone] )  [facility] []   [phone] )  [facility] []   [phone] )  [place], PharmD

## 2015-07-19 NOTE — Progress Notes (Signed)
Pt doesn't wish to wear Bipap for sleep at this time. Pt will notify if she changes her mind

## 2015-07-19 NOTE — Progress Notes (Signed)
Patient reported off to lance rn

## 2015-07-20 ENCOUNTER — Ambulatory Visit: Payer: Medicare Other

## 2015-07-20 LAB — CBC
HCT: 32 % — ABNORMAL LOW (ref 35.0–47.0)
Hemoglobin: 10.9 g/dL — ABNORMAL LOW (ref 12.0–16.0)
MCH: 34.3 pg — ABNORMAL HIGH (ref 26.0–34.0)
MCHC: 34 g/dL (ref 32.0–36.0)
MCV: 100.7 fL — ABNORMAL HIGH (ref 80.0–100.0)
PLATELETS: 60 10*3/uL — AB (ref 150–440)
RBC: 3.17 MIL/uL — AB (ref 3.80–5.20)
RDW: 19.4 % — AB (ref 11.5–14.5)
WBC: 3.6 10*3/uL (ref 3.6–11.0)

## 2015-07-20 LAB — BASIC METABOLIC PANEL
ANION GAP: 3 — AB (ref 5–15)
BUN: 10 mg/dL (ref 6–20)
CALCIUM: 9.2 mg/dL (ref 8.9–10.3)
CO2: 38 mmol/L — ABNORMAL HIGH (ref 22–32)
CREATININE: 0.56 mg/dL (ref 0.44–1.00)
Chloride: 96 mmol/L — ABNORMAL LOW (ref 101–111)
GFR calc non Af Amer: >60 mL/min (ref >60)
GLUCOSE: 85 mg/dL (ref 65–99)
POTASSIUM: 3.2 mmol/L — AB (ref 3.5–5.1)
SODIUM: 137 mmol/L (ref 135–145)

## 2015-07-20 LAB — VANCOMYCIN, TROUGH: Vancomycin Tr: 16 ug/mL (ref 10–20)

## 2015-07-20 MED ORDER — SODIUM CHLORIDE 0.9 % IJ SOLN
10.0000 mL | Freq: Two times a day (BID) | INTRAMUSCULAR | Status: DC
  Administered 2015-07-20 – 2015-07-21 (×2): 10 mL

## 2015-07-20 MED ORDER — SODIUM CHLORIDE 0.9 % IJ SOLN
10.0000 mL | INTRAMUSCULAR | Status: DC | PRN
  Administered 2015-07-21: 10 mL
  Filled 2015-07-20: qty 10

## 2015-07-20 MED ORDER — POTASSIUM CHLORIDE CRYS ER 20 MEQ PO TBCR
40.0000 meq | EXTENDED_RELEASE_TABLET | Freq: Once | ORAL | Status: AC
  Administered 2015-07-20: 40 meq via ORAL
  Filled 2015-07-20: qty 2

## 2015-07-20 MED ORDER — IPRATROPIUM-ALBUTEROL 0.5-2.5 (3) MG/3ML IN SOLN
3.0000 mL | Freq: Four times a day (QID) | RESPIRATORY_TRACT | Status: DC | PRN
Start: 2015-07-20 — End: 2015-07-21

## 2015-07-20 NOTE — Care Management (Signed)
Spoke with patient's daughter karen.  Patient is having some memory issues and does not always remember what she is told or what she has said.  She doe not remember declining skilled nursing facility at last discharge.  She has verbalized to [name]  that she does want to pursue skilled nursing placement at discharge.  Physical therapy consult is pending.  CM spoke with patient.  She does not immediately agree with CM when discussing skilled nursing placement.  She says she has a lot of machines for breathing that she can use.  She says that she does not want aide service because the one visit they made was at 10:30 at night.  Said she said okay to that but would not agree to that again.  Spoke with Floydene Flock from [facility] and asked if this could be confirmed.  [name] asks that she or her father be called with any updates.  She relays that patient is easily frightened and staff need to handle and speak in a very gentle manner

## 2015-07-20 NOTE — Care Management Important Message (Signed)
Important Message  Patient Details  Name: Rebecca Holland MRN: [id number] Date of Birth: 1951-06-03   Medicare Important Message Given:  Yes    Juliann Pulse A Daissy Yerian 07/20/2015, 10:16 AM
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
## 2015-07-20 NOTE — Progress Notes (Signed)
Pt requested breathing treatments to stop. I offered to change treatments to as needed and pt was agreeable. Pt continues to refuse Bipap. Discuss with pt the reasons for refusing Bipap and not wanting breathing treatments and she states that she doesn't need anything but oxygen. She states that she is going to rehab and will be fine without those things.

## 2015-07-21 ENCOUNTER — Ambulatory Visit: Payer: Medicare Other

## 2015-07-21 ENCOUNTER — Inpatient Hospital Stay: Payer: Medicare Other | Admitting: Hematology and Oncology

## 2015-07-21 LAB — BASIC METABOLIC PANEL
Anion gap: 4 — ABNORMAL LOW (ref 5–15)
BUN: 14 mg/dL (ref 6–20)
CALCIUM: 9.4 mg/dL (ref 8.9–10.3)
CHLORIDE: 97 mmol/L — AB (ref 101–111)
CO2: 35 mmol/L — AB (ref 22–32)
Creatinine, Ser: 0.78 mg/dL (ref 0.44–1.00)
GFR calc Af Amer: >60 mL/min (ref >60)
GFR calc non Af Amer: >60 mL/min (ref >60)
Glucose, Bld: 95 mg/dL (ref 65–99)
Potassium: 3.9 mmol/L (ref 3.5–5.1)
Sodium: 136 mmol/L (ref 135–145)

## 2015-07-21 MED ORDER — LEVOFLOXACIN 750 MG PO TABS: 750.0000 mg | ORAL_TABLET | Freq: Every day | ORAL | Status: DC

## 2015-07-21 MED ORDER — PREDNISONE 10 MG PO TABS: ORAL_TABLET | ORAL | Status: DC

## 2015-07-21 NOTE — Evaluation (Signed)
Physical Therapy Evaluation Patient Details Name: Rebecca Holland MRN: [id number] DOB: 1950/12/21 Today's Date: 07/21/2015   History of Present Illness  The patient presents emergency department via EMS after suffering a mechanical fall. This was subsequent to the patient being more easily fatigued and groggy as the day wore on. Her husband states that initially she was fidgety and had trouble finding words area then he noticed that she seemed to be very weak. He denies that the patient never complained of chest pain but he admits that she appeared to be short of breath at times. He also reports that she has had a nonproductive cough. Notably the patient was just discharged from the hospital approximately one week ago. The husband states that she appeared to improve for a few days but gradually started "not acting herself". In the emergency department the patient was found to be very somnolent. ABG showed significant hypercapnia which prompted emergency department staff to initiate BiPAP. An area of infiltrate was seen on chest x-ray concerning for pneumonia. Due to her respiratory failure emergency department staff called for admission  Clinical Impression  Pt demonstrates good bed mobility, transfers, and ambulation on this date. Overall she appears to be close to her baseline mobility. She does present with some balance deficits but is able to ambulate short distances without rolling walker without overt LOB. Pt denies DOE and SaO2 remains >90% on 3L/min O2 during ambulation. Pt would benefit [facility] for general conditioning, balance, and to improve cardiopulmonary endurance. Pt will benefit from skilled PT services to address deficits in strength, balance, and mobility in order to return to full function at home.    Follow Up Recommendations Home health PT;Supervision - Intermittent    Equipment Recommendations  None recommended by PT    Recommendations for Other Services        Precautions / Restrictions Precautions Precautions: Fall Restrictions Weight Bearing Restrictions: No      Mobility  Bed Mobility Overal bed mobility: Modified Independent             General bed mobility comments: HOB elevated and use of bed rails. Overall very functional  Transfers Overall transfer level: Needs assistance Equipment used: Rolling walker (2 wheeled) Transfers: Sit to/from Stand Sit to Stand: Supervision         General transfer comment: Pt demonstrates good LE strength come to standing. Good stability and balance in standing without UE support  Ambulation/Gait Ambulation/Gait assistance: Min guard (Progressing to supervision only) Ambulation Distance (Feet): 300 Feet Assistive device: Rolling walker (2 wheeled)   Gait velocity: Acceptable for full household ambulation but decreased   General Gait Details: Pt demonstrates reasonable speed for full household ambulation. Good stability with use of rolling walker. SaO2 remains >90% on 3L/min supplemental O2. Vitals and fatigue continuously monitored. Pt denies DOE. Good step length. Ability to perform horizontal and vertical head turns without over LOB  Stairs            Wheelchair Mobility    Modified Rankin (Stroke Patients Only)       Balance Overall balance assessment: Needs assistance   Sitting balance-Leahy Scale: Good       Standing balance-Leahy Scale: Fair Standing balance comment: Positive Rhomberg with eyes closed. Single leg stance 2-3 seconds on both LEs                             Pertinent Vitals/Pain Pain Assessment: 0-10 Pain  Score:  (Unwilling to rate) Pain Location: Pt complains of pain "all over." Unable to provide number on NPRS. Pt states, "I've been having pain for over 30 years." No reported acute onset pain Pain Intervention(s): Monitored during session;Premedicated before session    Home Living Family/patient expects to be discharged to::  Private residence Living Arrangements: Spouse/significant other Available Help at Discharge: Family Type of Home: House Home Access: Stairs to enter Entrance Stairs-Rails: None Entrance Stairs-Number of Steps: 1 Home Layout: One level Home Equipment: Environmental consultant - 2 wheels;Cane - single point;Bedside commode;Tub bench Additional Comments: Family is able to provide 24/7 assist when she has episodes of weakness, husband works full time    Prior Function Level of Independence: Needs assistance   Gait / Transfers Assistance Needed: Independent with walker  ADL's / Homemaking Assistance Needed: Assist with bathing, getting into/out of bath tub  Comments: Often needs AD, though on good days is functional w/o them     Hand Dominance   Dominant Hand: Right    Extremity/Trunk Assessment   Upper Extremity Assessment: Overall WFL for tasks assessed           Lower Extremity Assessment: Overall WFL for tasks assessed         Communication   Communication: No difficulties  Cognition Arousal/Alertness: Awake/alert Behavior During Therapy: WFL for tasks assessed/performed Overall Cognitive Status: Within Functional Limits for tasks assessed                      General Comments      Exercises        Assessment/Plan    PT Assessment Patient needs continued PT services  PT Diagnosis Difficulty walking;Generalized weakness;Abnormality of gait   PT Problem List Decreased strength;Decreased activity tolerance;Decreased balance;Decreased mobility;Decreased safety awareness;Pain  PT Treatment Interventions Gait training;DME instruction;Stair training;Therapeutic activities;Therapeutic exercise;Balance training;Neuromuscular re-education;Patient/family education   PT Goals (Current goals can be found in the Care Plan section) Acute Rehab PT Goals Patient Stated Goal: Return to prior level of function PT Goal Formulation: With patient Time For Goal Achievement:  08/04/15 Potential to Achieve Goals: Good    Frequency Min 2X/week   Barriers to discharge        Co-evaluation               End of Session Equipment Utilized During Treatment: Oxygen;Gait belt Activity Tolerance: Patient tolerated treatment well Patient left:  (On commode with CNA) Nurse Communication: Mobility status         Time: [id number] PT Time Calculation (min) (ACUTE ONLY): 25 min   Charges:   PT Evaluation $Initial PT Evaluation Tier I: 1 Procedure PT Treatments $Gait Training: 8-22 mins   PT G Codes:       Lyndel Safe Huprich PT, DPT   Huprich,Jason 07/21/2015, 10:41 AM

## 2015-07-21 NOTE — Plan of Care (Signed)
Problem: Phase II Progression Outcomes Goal: O2 sats > equal to 90% on RA or at baseline Outcome: Progressing Patient wears 3L chronically and maintains sats >90% at rest and while ambulating.

## 2015-07-21 NOTE — Discharge Instructions (Signed)
Chronic Obstructive Pulmonary Disease Chronic obstructive pulmonary disease (COPD) is a common lung condition in which airflow from the lungs is limited. COPD is a general term that can be used to describe many different lung problems that limit airflow, including both chronic bronchitis and emphysema. If you have COPD, your lung function will probably never return to normal, but there are measures you can take to improve lung function and make yourself feel better. CAUSES   Smoking (common).  Exposure to secondhand smoke.  Genetic problems.  Chronic inflammatory lung diseases or recurrent infections. SYMPTOMS  Shortness of breath, especially with physical activity.  Deep, persistent (chronic) cough with a large amount of thick mucus.  Wheezing.  Rapid breaths (tachypnea).  Gray or bluish discoloration (cyanosis) of the skin, especially in your fingers, toes, or lips.  Fatigue.  Weight loss.  Frequent infections or episodes when breathing symptoms become much worse (exacerbations).  Chest tightness. DIAGNOSIS Your health care provider will take a medical history and perform a physical examination to diagnose COPD. Additional tests for COPD may include:  Lung (pulmonary) function tests.  Chest X-ray.  CT scan.  Blood tests. TREATMENT  Treatment for COPD may include:  Inhaler and nebulizer medicines. These help manage the symptoms of COPD and make your breathing more comfortable.  Supplemental oxygen. Supplemental oxygen is only helpful if you have a low oxygen level in your blood.  Exercise and physical activity. These are beneficial for nearly all people with COPD.  Lung surgery or transplant.  Nutrition therapy to gain weight, if you are underweight.  Pulmonary rehabilitation. This may involve working with a team of health care providers and specialists, such as respiratory, occupational, and physical therapists. HOME CARE INSTRUCTIONS  Take all medicines  (inhaled or pills) as directed by your health care provider.  Avoid over-the-counter medicines or cough syrups that dry up your airway (such as antihistamines) and slow down the elimination of secretions unless instructed otherwise by your health care provider.  If you are a smoker, the most important thing that you can do is stop smoking. Continuing to smoke will cause further lung damage and breathing trouble. Ask your health care provider for help with quitting smoking. He or she can direct you to community resources or hospitals that provide support.  Avoid exposure to irritants such as smoke, chemicals, and fumes that aggravate your breathing.  Use oxygen therapy and pulmonary rehabilitation if directed by your health care provider. If you require home oxygen therapy, ask your health care provider whether you should purchase a pulse oximeter to measure your oxygen level at home.  Avoid contact with individuals who have a contagious illness.  Avoid extreme temperature and humidity changes.  Eat healthy foods. Eating smaller, more frequent meals and resting before meals may help you maintain your strength.  Stay active, but balance activity with periods of rest. Exercise and physical activity will help you maintain your ability to do things you want to do.  Preventing infection and hospitalization is very important when you have COPD. Make sure to receive all the vaccines your health care provider recommends, especially the pneumococcal and influenza vaccines. Ask your health care provider whether you need a pneumonia vaccine.  Learn and use relaxation techniques to manage stress.  Learn and use controlled breathing techniques as directed by your health care provider. Controlled breathing techniques include:  Pursed lip breathing. Start by breathing in (inhaling) through your nose for 1 second. Then, purse your lips as if you were  going to whistle and breathe out (exhale) through the  pursed lips for 2 seconds.  Diaphragmatic breathing. Start by putting one hand on your abdomen just above your waist. Inhale slowly through your nose. The hand on your abdomen should move out. Then purse your lips and exhale slowly. You should be able to feel the hand on your abdomen moving in as you exhale.  Learn and use controlled coughing to clear mucus from your lungs. Controlled coughing is a series of short, progressive coughs. The steps of controlled coughing are: 1. Lean your head slightly forward. 2. Breathe in deeply using diaphragmatic breathing. 3. Try to hold your breath for 3 seconds. 4. Keep your mouth slightly open while coughing twice. 5. Spit any mucus out into a tissue. 6. Rest and repeat the steps once or twice as needed. SEEK MEDICAL CARE IF:  You are coughing up more mucus than usual.  There is a change in the color or thickness of your mucus.  Your breathing is more labored than usual.  Your breathing is faster than usual. SEEK IMMEDIATE MEDICAL CARE IF:  You have shortness of breath while you are resting.  You have shortness of breath that prevents you from:  Being able to talk.  Performing your usual physical activities.  You have chest pain lasting longer than 5 minutes.  Your skin color is more cyanotic than usual.  You measure low oxygen saturations for longer than 5 minutes with a pulse oximeter. MAKE SURE YOU:  Understand these instructions.  Will watch your condition.  Will get help right away if you are not doing well or get worse.   This information is not intended to replace advice given to you by your health care provider. Make sure you discuss any questions you have with your health care provider.   Document Released: 05/08/2005 Document Revised: 08/19/2014 Document Reviewed: 03/25/2013 Elsevier Interactive Patient Education 2016 [employer]  Prednisone tablets What is this medicine? PREDNISONE (PRED ni sone) is a  corticosteroid. It is commonly used to treat inflammation of the skin, joints, lungs, and other organs. Common conditions treated include asthma, allergies, and arthritis. It is also used for other conditions, such as blood disorders and diseases of the adrenal glands. This medicine may be used for other purposes; ask your health care provider or pharmacist if you have questions. What should I tell my health care provider before I take this medicine? They need to know if you have any of these conditions: -Cushing's syndrome -diabetes -glaucoma -heart disease -high blood pressure -infection (especially a virus infection such as chickenpox, cold sores, or herpes) -kidney disease -liver disease -mental illness -myasthenia gravis -osteoporosis -seizures -stomach or intestine problems -thyroid disease -an unusual or allergic reaction to lactose, prednisone, other medicines, foods, dyes, or preservatives -pregnant or trying to get pregnant -breast-feeding How should I use this medicine? Take this medicine by mouth with a glass of water. Follow the directions on the prescription label. Take this medicine with food. If you are taking this medicine once a day, take it in the morning. Do not take more medicine than you are told to take. Do not suddenly stop taking your medicine because you may develop a severe reaction. Your doctor will tell you how much medicine to take. If your doctor wants you to stop the medicine, the dose may be slowly lowered over time to avoid any side effects. Talk to your pediatrician regarding the use of this medicine in children. Special care may  be needed. Overdosage: If you think you have taken too much of this medicine contact a poison control center or emergency room at once. NOTE: This medicine is only for you. Do not share this medicine with others. What if I miss a dose? If you miss a dose, take it as soon as you can. If it is almost time for your next dose, talk  to your doctor or health care professional. You may need to miss a dose or take an extra dose. Do not take double or extra doses without advice. What may interact with this medicine? Do not take this medicine with any of the following medications: -metyrapone -mifepristone This medicine may also interact with the following medications: -aminoglutethimide -amphotericin B -aspirin and aspirin-like medicines -barbiturates -certain medicines for diabetes, like glipizide or glyburide -cholestyramine -cholinesterase inhibitors -cyclosporine -digoxin -diuretics -ephedrine -female hormones, like estrogens and birth control pills -isoniazid -ketoconazole -NSAIDS, medicines for pain and inflammation, like ibuprofen or naproxen -phenytoin -rifampin -toxoids -vaccines -warfarin This list may not describe all possible interactions. Give your health care provider a list of all the medicines, herbs, non-prescription drugs, or dietary supplements you use. Also tell them if you smoke, drink alcohol, or use illegal drugs. Some items may interact with your medicine. What should I watch for while using this medicine? Visit your doctor or health care professional for regular checks on your progress. If you are taking this medicine over a prolonged period, carry an identification card with your name and address, the type and dose of your medicine, and your doctor's name and address. This medicine may increase your risk of getting an infection. Tell your doctor or health care professional if you are around anyone with measles or chickenpox, or if you develop sores or blisters that do not heal properly. If you are going to have surgery, tell your doctor or health care professional that you have taken this medicine within the last twelve months. Ask your doctor or health care professional about your diet. You may need to lower the amount of salt you eat. This medicine may affect blood sugar levels. If you  have diabetes, check with your doctor or health care professional before you change your diet or the dose of your diabetic medicine. What side effects may I notice from receiving this medicine? Side effects that you should report to your doctor or health care professional as soon as possible: -allergic reactions like skin rash, itching or hives, swelling of the face, lips, or tongue -changes in emotions or moods -changes in vision -depressed mood -eye pain -fever or chills, cough, sore throat, pain or difficulty passing urine -increased thirst -swelling of ankles, feet Side effects that usually do not require medical attention (report to your doctor or health care professional if they continue or are bothersome): -confusion, excitement, restlessness -headache -nausea, vomiting -skin problems, acne, thin and shiny skin -trouble sleeping -weight gain This list may not describe all possible side effects. Call your doctor for medical advice about side effects. You may report side effects to FDA at 1-800-FDA-1088. Where should I keep my medicine? Keep out of the reach of children. Store at room temperature between 15 and 30 degrees C (59 and 86 degrees F). Protect from light. Keep container tightly closed. Throw away any unused medicine after the expiration date. NOTE: This sheet is a summary. It may not cover all possible information. If you have questions about this medicine, talk to your doctor, pharmacist, or health care provider.  2016, Elsevier/Gold Standard. (2011-03-14 10:57:14)  Levofloxacin tablets What is this medicine? LEVOFLOXACIN (lee voe FLOX a sin) is a quinolone antibiotic. It is used to treat certain kinds of bacterial infections. It will not work for colds, flu, or other viral infections. This medicine may be used for other purposes; ask your health care provider or pharmacist if you have questions. What should I tell my health care provider before I take this  medicine? They need to know if you have any of these conditions: -bone problems -cerebral disease -history of low levels of potassium in the blood -irregular heartbeat -joint problems -kidney disease -myasthenia gravis -seizures -tendon problems -tingling of the fingers or toes, or other nerve disorder -an unusual or allergic reaction to levofloxacin, other quinolone antibiotics, foods, dyes, or preservatives -pregnant or trying to get pregnant -breast-feeding How should I use this medicine? Take this medicine by mouth with a full glass of water. Follow the directions on the prescription label. This medicine can be taken with or without food. Take your medicine at regular intervals. Do not take your medicine more often than directed. Do not skip doses or stop your medicine early even if you feel better. Do not stop taking except on your doctor's advice. A special MedGuide will be given to you by the pharmacist with each prescription and refill. Be sure to read this information carefully each time. Talk to your pediatrician regarding the use of this medicine in children. While this drug may be prescribed for children as young as 6 months for selected conditions, precautions do apply. Overdosage: If you think you have taken too much of this medicine contact a poison control center or emergency room at once. NOTE: This medicine is only for you. Do not share this medicine with others. What if I miss a dose? If you miss a dose, take it as soon as you remember. If it is almost time for your next dose, take only that dose. Do not take double or extra doses. What may interact with this medicine? Do not take this medicine with any of the following medications: -arsenic trioxide -chloroquine -droperidol -medicines for irregular heart rhythm like amiodarone, disopyramide, dofetilide, flecainide, quinidine, procainamide, sotalol -some medicines for depression or mental problems like phenothiazines,  pimozide, and ziprasidone This medicine may also interact with the following medications: -amoxapine -antacids -birth control pills -cisapride -dairy products -didanosine (ddI) buffered tablets or powder -haloperidol -multivitamins -NSAIDS, medicines for pain and inflammation, like ibuprofen or naproxen -retinoid products like tretinoin or isotretinoin -risperidone -some other antibiotics like clarithromycin or erythromycin -sucralfate -theophylline -warfarin This list may not describe all possible interactions. Give your health care provider a list of all the medicines, herbs, non-prescription drugs, or dietary supplements you use. Also tell them if you smoke, drink alcohol, or use illegal drugs. Some items may interact with your medicine. What should I watch for while using this medicine? Tell your doctor or health care professional if your symptoms do not improve or if they get worse. Drink several glasses of water a day and cut down on drinks that contain caffeine. You must not get dehydrated while taking this medicine. You may get drowsy or dizzy. Do not drive, use machinery, or do anything that needs mental alertness until you know how this medicine affects you. Do not sit or stand up quickly, especially if you are an older patient. This reduces the risk of dizzy or fainting spells. This medicine can make you more sensitive to the sun. Keep out  of the sun. If you cannot avoid being in the sun, wear protective clothing and use a sunscreen. Do not use sun lamps or tanning beds/booths. Contact your doctor if you get a sunburn. If you are a diabetic monitor your blood glucose carefully. If you get an unusual reading stop taking this medicine and call your doctor right away. Do not treat diarrhea with over-the-counter products. Contact your doctor if you have diarrhea that lasts more than 2 days or if the diarrhea is severe and watery. Avoid antacids, calcium, iron, and zinc products for 2  hours before and 2 hours after taking a dose of this medicine. What side effects may I notice from receiving this medicine? Side effects that you should report to your doctor or health care professional as soon as possible: -allergic reactions like skin rash or hives, swelling of the face, lips, or tongue -anxious -confusion -depressed mood -diarrhea -fast, irregular heartbeat -hallucination, loss of contact with reality -joint, muscle, or tendon pain or swelling -pain, tingling, numbness in the hands or feet -suicidal thoughts or other mood changes -sunburn -unusually weak or tired Side effects that usually do not require medical attention (report to your doctor or health care professional if they continue or are bothersome): -dry mouth -headache -nausea -trouble sleeping This list may not describe all possible side effects. Call your doctor for medical advice about side effects. You may report side effects to FDA at 1-800-FDA-1088. Where should I keep my medicine? Keep out of the reach of children. Store at room temperature between 15 and 30 degrees C (59 and 86 degrees F). Keep in a tightly closed container. Throw away any unused medicine after the expiration date. NOTE: This sheet is a summary. It may not cover all possible information. If you have questions about this medicine, talk to your doctor, pharmacist, or health care provider.    2016, Elsevier/Gold Standard. (2015-03-09 12:40:18)

## 2015-07-21 NOTE — Care Management (Signed)
Patient ambulated 300 feet with physical therapy and maintained her 02 sats.  She is on chronic 02 at home.  She does not qualify for skilled nursing.   Patient is afebrile, vital signs stable, no elevated white count.  Assessed by attending and deemed medically stable for discharge.  Notified [facility] to resume SN PT Aide and ST.  Patient say ST was ordered because her daughter thought patient was having issues with word finding.  Patient says in the presence of her sister that she will have supervision in the home.  Her sister confirms.   Informed that patient's daughter is on a plane for a wedding anniversary and does not want her disturbed.  Attending spoke with patient's husband who verbalized concern that patient needs continued IV antibiotics.   Concern that husband may appeal the discharge.  CM will speak with husband when he arrives on unit

## 2015-07-21 NOTE — Plan of Care (Signed)
Problem: Phase I Progression Outcomes Goal: Pain controlled Outcome: Not Met (add Reason) Pt has chronic pain that rates  a 7/10 after pain medication is administered.

## 2015-07-21 NOTE — Plan of Care (Signed)
Problem: Phase I Progression Outcomes Goal: Progress activity as tolerated unless otherwise ordered Outcome: Progressing Patient ambulated with physical therapy today with gait belt and walker and is currently sitting up in the chair (alarm in place).

## 2015-07-21 NOTE — Plan of Care (Signed)
Problem: Phase II Progression Outcomes Goal: Pain controlled on oral analgesia Outcome: Progressing Oral pain medication routine is helping, per pt. Report.
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
## 2015-07-21 NOTE — Progress Notes (Signed)
Pt. Discharged to home with [facility] PT via wc. Discharge instructions and medication regimen reviewed at bedside with patient and spouse. Both verbalize understanding of instructions and medication regimen. Prescriptions included with discharge papers. Patient assessment unchanged from this morning. TELE and IV discontinued per policy, port deaccessed.
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
## 2015-07-22 LAB — URINE CULTURE: Culture: NO GROWTH

## 2015-07-23 LAB — CULTURE, BLOOD (ROUTINE X 2)
CULTURE: NO GROWTH
CULTURE: NO GROWTH

## 2015-07-24 ENCOUNTER — Ambulatory Visit: Payer: Medicare Other

## 2015-07-25 ENCOUNTER — Ambulatory Visit: Payer: Medicare Other

## 2015-07-26 ENCOUNTER — Ambulatory Visit: Payer: Medicare Other

## 2015-07-27 ENCOUNTER — Ambulatory Visit: Payer: Medicare Other

## 2015-07-28 ENCOUNTER — Ambulatory Visit: Payer: Medicare Other

## 2015-07-28 LAB — GLUCOSE, CAPILLARY: Glucose-Capillary: 110 mg/dL — ABNORMAL HIGH (ref 65–99)

## 2015-07-31 ENCOUNTER — Ambulatory Visit: Payer: Medicare Other

## 2015-08-01 ENCOUNTER — Ambulatory Visit: Payer: Medicare Other

## 2015-08-08 ENCOUNTER — Encounter: Payer: Self-pay | Admitting: Internal Medicine

## 2015-08-08 ENCOUNTER — Ambulatory Visit (INDEPENDENT_AMBULATORY_CARE_PROVIDER_SITE_OTHER): Payer: Medicare Other | Admitting: Internal Medicine

## 2015-08-08 VITALS — BP 104/66 | HR 83 | Ht 67.0 in | Wt 149.0 lb

## 2015-08-08 DIAGNOSIS — Z23 Encounter for immunization: Secondary | ICD-10-CM

## 2015-08-08 DIAGNOSIS — J9602 Acute respiratory failure with hypercapnia: Secondary | ICD-10-CM | POA: Diagnosis not present

## 2015-08-08 DIAGNOSIS — J9612 Chronic respiratory failure with hypercapnia: Secondary | ICD-10-CM

## 2015-08-08 MED ORDER — PREDNISONE 5 MG PO TABS: 5.0000 mg | ORAL_TABLET | Freq: Every day | ORAL | Status: DC

## 2015-08-08 MED ORDER — UMECLIDINIUM-VILANTEROL 62.5-25 MCG/INH IN AEPB: 1.0000 | INHALATION_SPRAY | Freq: Every day | RESPIRATORY_TRACT | Status: DC

## 2015-08-08 NOTE — Progress Notes (Signed)
* [facility]     Assessment and Plan:  64 yo female with Bladder Ca (high grade) on chemo/rads, COPD on O2.  Acute on Chronic hypercapnic respiratory failure. Chronic hypoxic respiratory failure.   --She has had 3 separate admissions to the hospital with respiratory issues, pneumonia, cystitis over the past 3 months.  --She was last admitted with altered mental status with hypercapnia.  --Can continue prednisone.  --Will put in for possible noninvasive ventilator at night 12/6 qhs. She's had multiple admissions for COPD exacerbation and needs noninvasive ventilatory therapy. She thus requires access. 2. Ventilator therapy 24 hours a day, and we'll need to use it at least every day at night and for most of the day. She is at high risk for hospital readmission and death due to COPD exacerbation, respiratory failure from increased levels of carbon monoxide if she has not started on noninvasive ventilation. -Patient has had life-threatening hospitalizations resulting from her COPD.  Adrenal insuffiency.  --Notes lethargy now that she is off prednisone, which improves with 5 mg of prednisone daily. Will continue prednisone at 5 mg daily.   Polypharmacy --May be contributing to her lethargy.  --Try to cut back on flexeril and valium as tolerated.   COPD/Asthma -  - FEV1 50%, previously on Symbicort and Spriva, now switched to anoro.  - maintain sats >88%, on 2-3L continuous as an outpatient - has chronic RLL atlectasis and scarring, most likely from previous infections - recommend Incentive spirometry and tobacco cessation  -Nicotine Abuse.  -Recommend that she set a quite date, start chantix 1 week before, quit date.. Greater than 5 min spent in counseling.   Bladder Cancer - stage II (T2NxM0) invasive high grade bladder cancer s/p 3 cycles of gemcitabine and carboplatin (01/11/2015 - 03/08/2015). She is currently undergoing concurrent weekly chemotherapy  (carboplatin AUC 2) with daily radiation (began 05/02/2015; last 06/30/2015).  She was not able to complete her most recent cycle of chemo. She has developed bad UTI/cystitis with radiation.  This has contributed to her COPD exacerbations due to reduced immunity related to chemotherapy and radiation.  She confirms today that her code status is DNR, she does not want to be on life support.    Date: 08/08/2015  MRN# [id number] IRINI DOROTHY [birth year]   LOLITHA FRANA is a 64 y.o. old female seen in follow up for chief complaint of  Chief Complaint  Patient presents with  . Follow-up    pt. states brerathing has wrosen. occ. SOB. prod. cough brownish in color. occ. wheezing. denies chest pain/tightness. on 2L 02. in ED on 12/6     HPI:  Patient is a 64 year old female smoker with a past medical history of COPD, bladder cancer on chemotherapy. Her COPD is managed with 3 L of continuous oxygen.  At her last visit,  inhalers were switched to [facility] and she feels that this has worked better for her.  Currently she is using her albuterol/ipratropium nebulizer twice per day. She has continued to do PT at home, and has home visiting nurse at home.   Daughter is here today and provides much of the history for the patient. She notes that the patient is sleepy much of the day.   She has continued to smoke 6 cigs per day, she has been prescribed chantix but has not yet started it. It is very difficult to get a history from as she is very tangetial in her speech.   Blood gas review from  07/19/15. PH 7.41; CO2 59. Summary: chronic hypercapnic respiratory failure.   Medication:   Current Outpatient Rx  Name  Route  Sig  Dispense  Refill  . albuterol (PROVENTIL HFA;VENTOLIN HFA) 108 (90 BASE) MCG/ACT inhaler   Inhalation   Inhale 2 puffs into the lungs every 6 (six) hours as needed for wheezing or shortness of breath.         . Calcium Carbonate-Vit D-Min (CALCIUM 1200 PO)   Oral    Take 1 tablet by mouth daily.         . cyclobenzaprine (FLEXERIL) 10 MG tablet   Oral   Take 10 mg by mouth 2 (two) times daily.          . diazepam (VALIUM) 5 MG tablet   Oral   Take 5 mg by mouth See admin instructions. Take 1 tablet orally in the morning and take 2 tablets (10mg ) orally once a day at bedtime.         . Diphenhyd-Hydrocort-Nystatin (FIRST-DUKES MOUTHWASH) SUSP   Mouth/Throat   Use as directed 5 mLs in the mouth or throat 4 (four) times daily as needed.   1 Bottle   1   . diphenoxylate-atropine (LOMOTIL) 2.5-0.025 MG tablet   Oral   Take 1 tablet by mouth 4 (four) times daily as needed for diarrhea or loose stools.   30 tablet   0   . docusate sodium (COLACE) 50 MG capsule   Oral   Take 50 mg by mouth 2 (two) times daily.         . DULoxetine (CYMBALTA) 20 MG capsule   Oral   Take 20 mg by mouth daily.          . fexofenadine (ALLEGRA) 180 MG tablet   Oral   Take 180 mg by mouth daily.         . fluconazole (DIFLUCAN) 100 MG tablet   Oral   Take 1 tablet (100 mg total) by mouth daily.   5 tablet   0   . hyoscyamine (LEVSIN, ANASPAZ) 0.125 MG tablet   Oral   Take 1 tablet (0.125 mg total) by mouth every 4 (four) hours as needed.   30 tablet   1   . levofloxacin (LEVAQUIN) 750 MG tablet   Oral   Take 1 tablet (750 mg total) by mouth daily.   10 tablet   0   . lidocaine-prilocaine (EMLA) cream   Topical   Apply 1 application topically as needed (for application over port site 1 hour before treatment).         . magnesium oxide (MAG-OX) 400 (241.3 MG) MG tablet   Oral   Take 1 tablet (400 mg total) by mouth daily.   30 tablet   0   . Meth-Hyo-M Bl-Na Phos-Ph Sal (URIBEL PO)   Oral   Take 1 capsule by mouth every 6 (six) hours as needed (for bladder spasm.).          Marland Kitchen Misc Natural Products (COLON CARE PO)   Oral   Take 1 capsule by mouth daily.         Marland Kitchen oxycodone (ROXICODONE) 30 MG immediate release tablet    Oral   Take 30 mg by mouth every 4 (four) hours as needed for pain.          . Polyethylene Glycol 3350 (MIRALAX PO)   Oral   Take 1 packet by mouth 2 (two) times daily.         Marland Kitchen  potassium chloride SA (K-DUR,KLOR-CON) 20 MEQ tablet   Oral   Take 1 tablet (20 mEq total) by mouth daily.   5 tablet   0   . predniSONE (DELTASONE) 10 MG tablet      Label  & dispense according to the schedule below. 5 Pills PO for 1 day then, 4 Pills PO for 1 day, 3 Pills PO for 1 day, 2 Pills PO for 1 day, 1 Pill PO for 1 days then STOP.   15 tablet   0   . Probiotic Product (PROBIOTIC DAILY PO)   Oral   Take 1 capsule by mouth daily.          . Psyllium (METAMUCIL) WAFR   Oral   Take 1 Wafer by mouth every morning.         . ranitidine (ZANTAC) 150 MG tablet   Oral   Take 150 mg by mouth See admin instructions. Take 1 tablet orally once a day in the morning and take 2 tablets (300mg ) orally once a day at bedtime.         Marland Kitchen tiotropium (SPIRIVA) 18 MCG inhalation capsule   Inhalation   Place 18 mcg into inhaler and inhale daily.         . varenicline (CHANTIX CONTINUING MONTH PAK) 1 MG tablet      Starting on week 2 with new dose of chantix 1 mg bid Patient not taking: Reported on 07/18/2015   60 tablet   2   . vitamin B-12 1000 MCG tablet   Oral   Take 1 tablet (1,000 mcg total) by mouth daily.   30 tablet   0   . Vitamin D, Cholecalciferol, 400 UNITS CAPS   Oral   Take 2 tablets by mouth daily.            Allergies:  Iohexol; Betadine; Nsaids; Ondansetron; Tape; Latex; and Sulfa antibiotics  Review of Systems: Gen:  Denies  fever, sweats. HEENT: Denies blurred vision. Cvc:  No dizziness, chest pain or heaviness Resp:   Denies cough or sputum porduction. Gi: Denies swallowing difficulty, stomach pain.  Gu:  Denies bladder incontinence, burning urine Ext:   No Joint pain, stiffness. Skin: No skin rash, easy bruising. Endoc:  No polyuria, polydipsia. Psych: No  depression, insomnia. Other:  All other systems were reviewed and found to be negative other than what is mentioned in the HPI.   Physical Examination:   VS: BP 104/66 mmHg  Pulse 83  Ht 5\' 7"  (1.702 m)  Wt 149 lb (67.586 kg)  BMI 23.33 kg/m2  SpO2 93%  General Appearance: No distress  Neuro:without focal findings,  speech normal,  HEENT: PERRLA, EOM intact. Pulmonary: normal breath sounds, No wheezing.   CardiovascularNormal S1,S2.  No m/r/g.   Abdomen: Benign, Soft, non-tender. Renal:  No costovertebral tenderness  GU:  Not performed at this time. Endoc: No evident thyromegaly, no signs of acromegaly. Skin:   warm, no rash. Extremities: normal, no cyanosis, clubbing.   LABORATORY PANEL:   CBC No results for input(s): WBC, HGB, HCT, PLT in the last 168 hours. ------------------------------------------------------------------------------------------------------------------  Chemistries  No results for input(s): NA, K, CL, CO2, GLUCOSE, BUN, CREATININE, CALCIUM, MG, AST, ALT, ALKPHOS, BILITOT in the last 168 hours.  Invalid input(s): GFRCGP ------------------------------------------------------------------------------------------------------------------  Cardiac Enzymes No results for input(s): TROPONINI in the last 168 hours. ------------------------------------------------------------  RADIOLOGY:   No results found for this or any previous visit. Results for orders placed during the hospital  encounter of 03/08/15  DG Chest 2 View   Narrative CLINICAL DATA:  Fall over the weekend. Pain with deep inspiration and cough. Bruising to left side of ribs.  EXAM: CHEST  2 VIEW  COMPARISON:  01/05/2015  FINDINGS: Mild hyperinflation of the lungs. Left Port-A-Cath remains in place with the tip in the SVC, unchanged. Heart is normal size. Tortuosity of the thoracic aorta. Chronic interstitial prominence throughout the lungs. Increasing bibasilar linear densities,  likely atelectasis. No visible rib fractures. No effusions or pneumothorax.  IMPRESSION: COPD/chronic interstitial prominence.  Increasing bibasilar atelectasis.  No visible rib fracture or pneumothorax.   Electronically Signed   By: Rolm Baptise M.D.   On: 03/08/2015 14:13    ------------------------------------------------------------------------------------------------------------------  Thank  you for allowing [facility] to assist in the care of your patient. Our recommendations are noted above.  Please contact us if we can be of further service.   Marda Stalker, MD.  [facility]  Patricia Pesa, M.D.  Vilinda Boehringer, M.D.  Merton Border, M.D

## 2015-08-08 NOTE — Addendum Note (Signed)
Addended by: Oscar La R on: 08/08/2015 01:08 PM   Modules accepted: Orders

## 2015-08-08 NOTE — Patient Instructions (Addendum)
-  Take prednisone 5 mg daily. -Prevnar 13. Pneumococcal vaccine today. -We'll start noninvasive ventilation every night. This is to try to reduce the episodes of COPD exacerbation. -Try to reduce the amount of Flexeril and Valium. -Start Anoro in place of Spiriva. -Okay to start Chantix, 1 week before, quit date.

## 2015-08-15 ENCOUNTER — Telehealth: Payer: Self-pay

## 2015-08-15 NOTE — Telephone Encounter (Signed)
Spoke with spouse, Nicki Reaper and he states he has spoken with [facility]. Did inform him to give [place] a call if needed. Nothing further needed at this time.

## 2015-08-15 NOTE — Telephone Encounter (Signed)
Nurse with [facility] is requesting an order for BiPAP. Please call.

## 2015-08-15 NOTE — Telephone Encounter (Signed)
Patient's spouse called regarding the cpap maching. Please call Scott.

## 2015-08-28 ENCOUNTER — Ambulatory Visit
Admission: RE | Admit: 2015-08-28 | Discharge: 2015-08-28 | Disposition: A | Discharge status: Home / Self Care | Payer: Medicare Other | Source: Ambulatory Visit | Attending: Radiation Oncology | Admitting: Radiation Oncology

## 2015-08-28 ENCOUNTER — Inpatient Hospital Stay: Payer: Medicare Other | Attending: Hematology and Oncology | Admitting: Hematology and Oncology

## 2015-08-28 ENCOUNTER — Inpatient Hospital Stay: Payer: Medicare Other

## 2015-08-28 ENCOUNTER — Encounter: Payer: Self-pay | Admitting: Radiation Oncology

## 2015-08-28 VITALS — BP 114/72 | HR 86 | Temp 96.8°F | Resp 18 | Wt 140.2 lb

## 2015-08-28 VITALS — BP 114/72 | HR 86 | Temp 96.8°F | Resp 20 | Wt 140.2 lb

## 2015-08-28 DIAGNOSIS — K219 Gastro-esophageal reflux disease without esophagitis: Secondary | ICD-10-CM | POA: Diagnosis not present

## 2015-08-28 DIAGNOSIS — I129 Hypertensive chronic kidney disease with stage 1 through stage 4 chronic kidney disease, or unspecified chronic kidney disease: Secondary | ICD-10-CM | POA: Diagnosis not present

## 2015-08-28 DIAGNOSIS — M1990 Unspecified osteoarthritis, unspecified site: Secondary | ICD-10-CM | POA: Diagnosis not present

## 2015-08-28 DIAGNOSIS — Z7952 Long term (current) use of systemic steroids: Secondary | ICD-10-CM | POA: Insufficient documentation

## 2015-08-28 DIAGNOSIS — Z803 Family history of malignant neoplasm of breast: Secondary | ICD-10-CM | POA: Diagnosis not present

## 2015-08-28 DIAGNOSIS — N3040 Irradiation cystitis without hematuria: Secondary | ICD-10-CM | POA: Diagnosis not present

## 2015-08-28 DIAGNOSIS — R5383 Other fatigue: Secondary | ICD-10-CM | POA: Diagnosis not present

## 2015-08-28 DIAGNOSIS — C7919 Secondary malignant neoplasm of other urinary organs: Secondary | ICD-10-CM | POA: Diagnosis not present

## 2015-08-28 DIAGNOSIS — Z9989 Dependence on other enabling machines and devices: Secondary | ICD-10-CM | POA: Diagnosis not present

## 2015-08-28 DIAGNOSIS — R32 Unspecified urinary incontinence: Secondary | ICD-10-CM | POA: Diagnosis not present

## 2015-08-28 DIAGNOSIS — F17210 Nicotine dependence, cigarettes, uncomplicated: Secondary | ICD-10-CM | POA: Diagnosis not present

## 2015-08-28 DIAGNOSIS — Z923 Personal history of irradiation: Secondary | ICD-10-CM | POA: Diagnosis not present

## 2015-08-28 DIAGNOSIS — D751 Secondary polycythemia: Secondary | ICD-10-CM | POA: Insufficient documentation

## 2015-08-28 DIAGNOSIS — C679 Malignant neoplasm of bladder, unspecified: Secondary | ICD-10-CM | POA: Diagnosis present

## 2015-08-28 DIAGNOSIS — J449 Chronic obstructive pulmonary disease, unspecified: Secondary | ICD-10-CM | POA: Insufficient documentation

## 2015-08-28 DIAGNOSIS — D72829 Elevated white blood cell count, unspecified: Secondary | ICD-10-CM | POA: Insufficient documentation

## 2015-08-28 DIAGNOSIS — Z9981 Dependence on supplemental oxygen: Secondary | ICD-10-CM | POA: Diagnosis not present

## 2015-08-28 DIAGNOSIS — Z79899 Other long term (current) drug therapy: Secondary | ICD-10-CM | POA: Insufficient documentation

## 2015-08-28 DIAGNOSIS — M797 Fibromyalgia: Secondary | ICD-10-CM | POA: Diagnosis not present

## 2015-08-28 DIAGNOSIS — Z801 Family history of malignant neoplasm of trachea, bronchus and lung: Secondary | ICD-10-CM | POA: Diagnosis not present

## 2015-08-28 DIAGNOSIS — N189 Chronic kidney disease, unspecified: Secondary | ICD-10-CM

## 2015-08-28 LAB — BASIC METABOLIC PANEL
Anion gap: 4 — ABNORMAL LOW (ref 5–15)
BUN: 9 mg/dL (ref 6–20)
CO2: 35 mmol/L — ABNORMAL HIGH (ref 22–32)
Calcium: 9 mg/dL (ref 8.9–10.3)
Chloride: 90 mmol/L — ABNORMAL LOW (ref 101–111)
Creatinine, Ser: 0.61 mg/dL (ref 0.44–1.00)
GFR calc Af Amer: >60 mL/min (ref >60)
GFR calc non Af Amer: >60 mL/min (ref >60)
Glucose, Bld: 109 mg/dL — ABNORMAL HIGH (ref 65–99)
Potassium: 4.2 mmol/L (ref 3.5–5.1)
Sodium: 129 mmol/L — ABNORMAL LOW (ref 135–145)

## 2015-08-28 LAB — CBC WITH DIFFERENTIAL/PLATELET
Basophils Absolute: 0 10*3/uL (ref 0–0.1)
Basophils Relative: 0 %
Eosinophils Absolute: 0.1 10*3/uL (ref 0–0.7)
Eosinophils Relative: 1 %
HCT: 35.6 % (ref 35.0–47.0)
Hemoglobin: 12.1 g/dL (ref 12.0–16.0)
Lymphocytes Relative: 14 %
Lymphs Abs: 1 10*3/uL (ref 1.0–3.6)
MCH: 35.8 pg — ABNORMAL HIGH (ref 26.0–34.0)
MCHC: 34 g/dL (ref 32.0–36.0)
MCV: 105.4 fL — ABNORMAL HIGH (ref 80.0–100.0)
Monocytes Absolute: 0.7 10*3/uL (ref 0.2–0.9)
Monocytes Relative: 9 %
Neutro Abs: 5.9 10*3/uL (ref 1.4–6.5)
Neutrophils Relative %: 76 %
Platelets: 220 10*3/uL (ref 150–440)
RBC: 3.38 MIL/uL — ABNORMAL LOW (ref 3.80–5.20)
RDW: 17.6 % — ABNORMAL HIGH (ref 11.5–14.5)
WBC: 7.7 10*3/uL (ref 3.6–11.0)

## 2015-08-28 LAB — MAGNESIUM: Magnesium: 2 mg/dL (ref 1.7–2.4)

## 2015-08-28 MED ORDER — SODIUM CHLORIDE 0.9 % IJ SOLN
10.0000 mL | INTRAMUSCULAR | Status: DC | PRN
  Administered 2015-08-28: 10 mL via INTRAVENOUS
  Filled 2015-08-28: qty 10

## 2015-08-28 MED ORDER — HEPARIN SOD (PORK) LOCK FLUSH 100 UNIT/ML IV SOLN
500.0000 [IU] | Freq: Once | INTRAVENOUS | Status: AC
  Administered 2015-08-28: 500 [IU] via INTRAVENOUS
  Filled 2015-08-28: qty 5

## 2015-08-28 NOTE — Progress Notes (Addendum)
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 MD  08/28/2015, 11:50 AM

## 2015-08-28 NOTE — Progress Notes (Signed)
Radiation Oncology Follow up Note  Name: Rebecca Holland   Date:   08/28/2015 MRN:  [id number] DOB: 1951-02-01    This 65 y.o. female presents to the clinic today for follow-up bladder cancer.  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 She is had significant difficult time with her radiation requiring multiple treatment stops as well as poor patient compliance. We did not finish her previous prescription for radiation. She's had 3 hospitalizations for both pneumonia and radiation cystitis over the past month. She also has problems with hyponatremia hypokalemia hypomagnesemia. She recently had a cystoscopy showing according to the patient and her daughter no evidence of disease.  COMPLICATIONS OF TREATMENT: none  FOLLOW UP COMPLIANCE: keeps appointments   PHYSICAL EXAM:  BP 114/72 mmHg  Pulse 86  Temp(Src) 96.8 F (36 C)  Resp 20  Wt 140 lb 3.4 oz (63.6 kg) Elderly wheelchair-bound female in NAD on nasal oxygen. No suprapubic tendon tenderness is noted. Well-developed well-nourished patient in NAD. HEENT reveals PERLA, EOMI, discs not visualized.  Oral cavity is clear. No oral mucosal lesions are identified. Neck is clear without evidence of cervical or supraclavicular adenopathy. Lungs are clear to A&P. Cardiac examination is essentially unremarkable with regular rate and rhythm without murmur rub or thrill. Abdomen is benign with no organomegaly or masses noted. Motor sensory and DTR levels are equal and symmetric in the upper and lower extremities. Cranial nerves II through XII are grossly intact. Proprioception is intact. No peripheral adenopathy or edema is identified. No motor or sensory levels are noted. Crude visual fields are within normal range.  RADIOLOGY  RESULTS: No current films for review  PLAN: At the present time she is doing fairly well am please were just started her cystoscopy report. She continues close follow-up care with medical oncology. I've asked to see her back in 4-5 months for follow-up. She seems to be prone extremely frequently at this time with both pneumonia and cystitis. Those areas will have to be closely watched. She is seeing medical oncology this morning. They have requested a urinalysis which I'll defer to medical oncology. Patient and daughter know to call with any concerns.  I would like to take this opportunity for allowing me to participate in the care of your patient.Armstead Peaks., MD

## 2015-09-19 ENCOUNTER — Encounter: Payer: Self-pay | Admitting: Hematology and Oncology

## 2015-09-22 ENCOUNTER — Ambulatory Visit (INDEPENDENT_AMBULATORY_CARE_PROVIDER_SITE_OTHER): Payer: Medicare Other | Admitting: Internal Medicine

## 2015-09-22 ENCOUNTER — Telehealth: Payer: Self-pay | Admitting: *Deleted

## 2015-09-22 ENCOUNTER — Encounter: Payer: Self-pay | Admitting: Internal Medicine

## 2015-09-22 ENCOUNTER — Ambulatory Visit
Admission: RE | Admit: 2015-09-22 | Discharge: 2015-09-22 | Disposition: A | Discharge status: Home / Self Care | Payer: Medicare Other | Source: Ambulatory Visit | Attending: Internal Medicine | Admitting: Internal Medicine

## 2015-09-22 VITALS — BP 122/72 | HR 85 | Temp 98.6°F | Ht 67.0 in | Wt 144.1 lb

## 2015-09-22 DIAGNOSIS — J9611 Chronic respiratory failure with hypoxia: Secondary | ICD-10-CM | POA: Diagnosis not present

## 2015-09-22 DIAGNOSIS — C679 Malignant neoplasm of bladder, unspecified: Secondary | ICD-10-CM | POA: Insufficient documentation

## 2015-09-22 DIAGNOSIS — J449 Chronic obstructive pulmonary disease, unspecified: Secondary | ICD-10-CM | POA: Diagnosis not present

## 2015-09-22 DIAGNOSIS — J9612 Chronic respiratory failure with hypercapnia: Secondary | ICD-10-CM

## 2015-09-22 DIAGNOSIS — Z720 Tobacco use: Secondary | ICD-10-CM | POA: Diagnosis not present

## 2015-09-22 DIAGNOSIS — R0600 Dyspnea, unspecified: Secondary | ICD-10-CM

## 2015-09-22 MED ORDER — PREDNISONE 10 MG (21) PO TBPK
10.0000 mg | ORAL_TABLET | Freq: Every day | ORAL | Status: DC
Start: 2015-09-22 — End: 2015-10-02

## 2015-09-22 MED ORDER — AZITHROMYCIN 250 MG PO TABS: ORAL_TABLET | ORAL | Status: AC

## 2015-09-22 MED ORDER — PREDNISONE 5 MG PO TABS: 5.0000 mg | ORAL_TABLET | Freq: Every day | ORAL | Status: DC

## 2015-09-22 NOTE — Telephone Encounter (Signed)
Pt is coming in today at 1120

## 2015-09-22 NOTE — Addendum Note (Signed)
Addended by: Maryanna Shape A on: 09/22/2015 12:23 PM   Modules accepted: Orders

## 2015-09-22 NOTE — Patient Instructions (Addendum)
-  2 view chest x-ray today.  Z-pack.  Prednisone 10 mg tabs x 42. Take 6 tabs for 2 days, then 5-4-3-2-1-stop. Then go back to 5 mg daily.   -If you're feeling excessively sleepy, disoriented or your breathing worsens. You should call EMS. -Continue to use BiPAP every night when you're sleeping to try to reduce the episodes of COPD exacerbation. -Try to reduce Valium to once per day. -Try to reduce Flexeril to once or twice a day only. -Take the prednisone and Z-Pak as prescribed. After you're finished with the prednisone Dosepak go back to the prednisone 5 mg daily. -Continue Spiriva. -Cut down on her smoking.

## 2015-09-22 NOTE — Telephone Encounter (Signed)
Pt daughter calling stating that she is worried for patient  Worried pt may have pneumonia Pt is very weak and hands shaking Oxygen stats are around 80-82 and is wearing her oxygen   Not sure if they can come in to see [place] or does patient need to go to ED  Last time she was her CO level was high  Please advise.

## 2015-09-22 NOTE — Progress Notes (Signed)
* [facility]     Assessment and Plan:   Acute on Chronic hypercapnic respiratory failure. Chronic hypoxic respiratory failure.  --She has had 3 separate admissions to the hospital with respiratory issues, pneumonia, cystitis over the past 3 months.  --She is feeling more tired today, possibly due to hypercapnia or adrenal insufficiency.   We discussed that she is not doing well and she may need to go to the ER. I think that her CO2 may be elevated and she need to go on Bipap as well as starting on steroids for possible adrenal insufficiency.   AECOPD.  -Will give prednisone taper and Zpack. Advised to go to ER but she declines.  -CXR today.  Adrenal insuffiency.  --Notes lethargy now on prednisone 5 mg daily. We'll give her a prednisone burst and taper.  Polypharmacy --May be contributing to her lethargy.  --Continue to use flexeril and valium sparingly as they can reduce respiration.   COPD/Asthma -  - FEV1 50%, previously on Symbicort and Spriva, now switched to anoro.  - maintain sats >88%, on 2-3L continuous as an outpatient - has chronic RLL atlectasis and scarring, most likely from previous infections - recommend Incentive spirometry and tobacco cessation  -Nicotine Abuse.  -Discussed smoking cessation.  Bladder Cancer - stage II (T2NxM0) invasive high grade bladder cancer s/p 3 cycles of gemcitabine and carboplatin (01/11/2015 - 03/08/2015). She is currently undergoing concurrent weekly chemotherapy (carboplatin AUC 2) with daily radiation (began 05/02/2015; last 06/30/2015). She was not able to complete her most recent cycle of chemo. She has developed bad UTI/cystitis with radiation.  This has contributed to her COPD exacerbations due to reduced immunity related to chemotherapy and radiation.     Date: 09/22/2015  MRN# [id number] Rebecca Holland [birth year]   Rebecca Holland is a 65 y.o. old female seen in follow up for chief  complaint of  Chief Complaint  Patient presents with  . Acute Visit    pt. c/o fatigued. chills. prod. cough grey in color. denies wheezing or chest pain/tightness X1wk.      HPI:   Patient is a 65 year old female smoker with a past medical history of COPD, bladder cancer on chemotherapy. Her COPD is managed with 3 L pulse dose oxygen. She is present with her daughter today, she has been weak and tired over the past week. She has been having trouble holding her coffee up. Her daughter notes that she is sleeping a lot and even falls asleep during during nebs.  She was prescribed bipap, but has not been using it because of claustrophobia.   She thinks she is taking the prednisone 5 mg daily.   Last week she got a course of ceftin because of possible UTI, she started feeling bad when she stopped it. She also got a course of diflucan.   She was taking Anoro but stopped it due to cost, she went back to spiriva daily. Currently she is using her albuterol/ipratropium nebulizer three times per day. She has continued to do PT at home, and has home visiting nurse at home.   Daughter is here today and provides much of the history for the patient. She notes that the patient is sleepy much of the day.   She has continued to smoke 6 cigs per day, she has been prescribed chantix but has not yet started it. It is very difficult to get a history from as she is very tangetial in her speech.   Blood gas  07/19/15. PH 7.41; CO2 59.    Medication:   Outpatient Encounter Prescriptions as of 09/22/2015  Medication Sig  . albuterol (PROVENTIL HFA;VENTOLIN HFA) 108 (90 BASE) MCG/ACT inhaler Inhale 2 puffs into the lungs every 6 (six) hours as needed for wheezing or shortness of breath.  . Calcium Carbonate-Vit D-Min (CALCIUM 1200 PO) Take 1 tablet by mouth daily.  . cyclobenzaprine (FLEXERIL) 10 MG tablet Take 10 mg by mouth 2 (two) times daily.   . diazepam (VALIUM) 5 MG tablet Take 5 mg by mouth See admin  instructions. Take 1 tablet orally in the morning and take 2 tablets (10mg ) orally once a day at bedtime.  . diazepam (VALIUM) 5 MG tablet   . Diphenhyd-Hydrocort-Nystatin (FIRST-DUKES MOUTHWASH) SUSP Use as directed 5 mLs in the mouth or throat 4 (four) times daily as needed.  . diphenoxylate-atropine (LOMOTIL) 2.5-0.025 MG tablet Take 1 tablet by mouth 4 (four) times daily as needed for diarrhea or loose stools.  . docusate sodium (COLACE) 50 MG capsule Take 50 mg by mouth 2 (two) times daily.  . DULoxetine (CYMBALTA) 20 MG capsule Take 20 mg by mouth daily.   . DULoxetine (CYMBALTA) 20 MG capsule   . fexofenadine (ALLEGRA) 180 MG tablet Take 180 mg by mouth daily.  . fluconazole (DIFLUCAN) 100 MG tablet Take 1 tablet (100 mg total) by mouth daily.  . hyoscyamine (LEVSIN, ANASPAZ) 0.125 MG tablet Take 1 tablet (0.125 mg total) by mouth every 4 (four) hours as needed.  Marland Kitchen ipratropium-albuterol (DUONEB) 0.5-2.5 (3) MG/3ML SOLN Inhale into the lungs.  Marland Kitchen ipratropium-albuterol (DUONEB) 0.5-2.5 (3) MG/3ML SOLN Inhale into the lungs.  Marland Kitchen levofloxacin (LEVAQUIN) 750 MG tablet Take 1 tablet (750 mg total) by mouth daily.  Marland Kitchen lidocaine-prilocaine (EMLA) cream Apply 1 application topically as needed (for application over port site 1 hour before treatment).  . magnesium oxide (MAG-OX) 400 (241.3 MG) MG tablet Take 1 tablet (400 mg total) by mouth daily.  . Meth-Hyo-M Bl-Na Phos-Ph Sal (URIBEL PO) Take 1 capsule by mouth every 6 (six) hours as needed (for bladder spasm.).   Marland Kitchen Misc Natural Products (COLON CARE PO) Take 1 capsule by mouth daily.  Marland Kitchen oxycodone (ROXICODONE) 30 MG immediate release tablet Take 30 mg by mouth every 4 (four) hours as needed for pain.   . Polyethylene Glycol 3350 (MIRALAX PO) Take 1 packet by mouth 2 (two) times daily.  . potassium chloride SA (K-DUR,KLOR-CON) 20 MEQ tablet Take 1 tablet (20 mEq total) by mouth daily.  . Probiotic Product (PROBIOTIC DAILY PO) Take 1 capsule by mouth  daily.   . Psyllium (METAMUCIL) WAFR Take 1 Wafer by mouth every morning.  . ranitidine (ZANTAC) 150 MG tablet Take 150 mg by mouth See admin instructions. Take 1 tablet orally once a day in the morning and take 2 tablets (300mg ) orally once a day at bedtime.  Marland Kitchen tiotropium (SPIRIVA) 18 MCG inhalation capsule Place 18 mcg into inhaler and inhale daily.  Marland Kitchen Umeclidinium-Vilanterol 62.5-25 MCG/INH AEPB Inhale 1 puff into the lungs daily.  . varenicline (CHANTIX CONTINUING MONTH PAK) 1 MG tablet Starting on week 2 with new dose of chantix 1 mg bid  . vitamin B-12 1000 MCG tablet Take 1 tablet (1,000 mcg total) by mouth daily.  . Vitamin D, Cholecalciferol, 400 UNITS CAPS Take 2 tablets by mouth daily.  . [DISCONTINUED] predniSONE (DELTASONE) 10 MG tablet Label  & dispense according to the schedule below. 5 Pills PO for 1 day then, 4 Pills  PO for 1 day, 3 Pills PO for 1 day, 2 Pills PO for 1 day, 1 Pill PO for 1 days then STOP. (Patient not taking: Reported on 09/22/2015)  . [DISCONTINUED] predniSONE (DELTASONE) 5 MG tablet Take 1 tablet (5 mg total) by mouth daily with breakfast. (Patient not taking: Reported on 09/22/2015)   No facility-administered encounter medications on file as of 09/22/2015.     Allergies:  Iohexol; Betadine; Nsaids; Ondansetron; Tape; Latex; and Sulfa antibiotics  Review of Systems: Gen:  Denies  fever, sweats. HEENT: Denies blurred vision. Cvc:  No dizziness, chest pain or heaviness Resp:   Denies cough or sputum porduction. Gi: Denies swallowing difficulty, stomach pain. constipation, bowel incontinence Gu:  Denies bladder incontinence, burning urine Ext:   No Joint pain, stiffness. Skin: No skin rash, easy bruising. Endoc:  No polyuria, polydipsia. Psych: No depression, insomnia. Other:  All other systems were reviewed and found to be negative other than what is mentioned in the HPI.   Physical Examination:   VS: BP 122/72 mmHg  Pulse 85  Ht 5\' 7"  (1.702 m)  Wt  144 lb 1.6 oz (65.363 kg)  BMI 22.56 kg/m2  SpO2 90%  General Appearance: No distress  Neuro:without focal findings,  speech normal,  HEENT: PERRLA, EOM intact. Pulmonary: normal breath sounds, No wheezing.   CardiovascularNormal S1,S2.  No m/r/g.   Abdomen: Benign, Soft, non-tender. Renal:  No costovertebral tenderness  GU:  Not performed at this time. Endoc: No evident thyromegaly, no signs of acromegaly. Skin:   warm, no rash. Extremities: normal, no cyanosis, clubbing.   LABORATORY PANEL:   CBC No results for input(s): WBC, HGB, HCT, PLT in the last 168 hours. ------------------------------------------------------------------------------------------------------------------  Chemistries  No results for input(s): NA, K, CL, CO2, GLUCOSE, BUN, CREATININE, CALCIUM, MG, AST, ALT, ALKPHOS, BILITOT in the last 168 hours.  Invalid input(s): GFRCGP ------------------------------------------------------------------------------------------------------------------  Cardiac Enzymes No results for input(s): TROPONINI in the last 168 hours. ------------------------------------------------------------  RADIOLOGY:   No results found for this or any previous visit. Results for orders placed during the hospital encounter of 06/12/15  DG Chest 2 View   Narrative CLINICAL DATA:  Weakness  EXAM: CHEST  2 VIEW  COMPARISON:  03/08/2015  FINDINGS: Cardiomediastinal silhouette is stable. Mild hyperinflation again noted. Left Port-A-Cath is unchanged in position. No infiltrate or pulmonary edema.  IMPRESSION: No active cardiopulmonary disease.   Electronically Signed   By: Lahoma Crocker M.D.   On: 06/12/2015 14:08    ------------------------------------------------------------------------------------------------------------------  Thank  you for allowing [facility] to assist in the care of your patient. Our recommendations are noted above.  Please contact  us if we can be of further service.   Marda Stalker, MD.  [facility]  Patricia Pesa, M.D.  Vilinda Boehringer, M.D.  Merton Border, M.D

## 2015-09-22 NOTE — Addendum Note (Signed)
Addended by: Maryanna Shape A on: 09/22/2015 12:16 PM   Modules accepted: Orders

## 2015-10-02 ENCOUNTER — Emergency Department: Payer: Medicare Other

## 2015-10-02 ENCOUNTER — Inpatient Hospital Stay: Payer: Medicare Other

## 2015-10-02 ENCOUNTER — Inpatient Hospital Stay
Admission: EM | Admit: 2015-10-02 | Discharge: 2015-10-04 | DRG: 190 | Disposition: A | Discharge status: Home / Self Care | Payer: Medicare Other | Attending: Internal Medicine | Admitting: Internal Medicine

## 2015-10-02 ENCOUNTER — Encounter: Payer: Self-pay | Admitting: Emergency Medicine

## 2015-10-02 DIAGNOSIS — Z79899 Other long term (current) drug therapy: Secondary | ICD-10-CM

## 2015-10-02 DIAGNOSIS — C679 Malignant neoplasm of bladder, unspecified: Secondary | ICD-10-CM | POA: Diagnosis present

## 2015-10-02 DIAGNOSIS — Z9049 Acquired absence of other specified parts of digestive tract: Secondary | ICD-10-CM | POA: Diagnosis not present

## 2015-10-02 DIAGNOSIS — Z9889 Other specified postprocedural states: Secondary | ICD-10-CM | POA: Diagnosis not present

## 2015-10-02 DIAGNOSIS — Z9981 Dependence on supplemental oxygen: Secondary | ICD-10-CM | POA: Diagnosis not present

## 2015-10-02 DIAGNOSIS — N183 Chronic kidney disease, stage 3 (moderate): Secondary | ICD-10-CM | POA: Diagnosis present

## 2015-10-02 DIAGNOSIS — Z90710 Acquired absence of both cervix and uterus: Secondary | ICD-10-CM | POA: Diagnosis not present

## 2015-10-02 DIAGNOSIS — Z882 Allergy status to sulfonamides status: Secondary | ICD-10-CM | POA: Diagnosis not present

## 2015-10-02 DIAGNOSIS — K219 Gastro-esophageal reflux disease without esophagitis: Secondary | ICD-10-CM | POA: Diagnosis present

## 2015-10-02 DIAGNOSIS — J9622 Acute and chronic respiratory failure with hypercapnia: Secondary | ICD-10-CM | POA: Diagnosis present

## 2015-10-02 DIAGNOSIS — R0602 Shortness of breath: Secondary | ICD-10-CM

## 2015-10-02 DIAGNOSIS — M1990 Unspecified osteoarthritis, unspecified site: Secondary | ICD-10-CM | POA: Diagnosis present

## 2015-10-02 DIAGNOSIS — R0902 Hypoxemia: Secondary | ICD-10-CM | POA: Diagnosis present

## 2015-10-02 DIAGNOSIS — I129 Hypertensive chronic kidney disease with stage 1 through stage 4 chronic kidney disease, or unspecified chronic kidney disease: Secondary | ICD-10-CM | POA: Diagnosis present

## 2015-10-02 DIAGNOSIS — J45909 Unspecified asthma, uncomplicated: Secondary | ICD-10-CM | POA: Diagnosis present

## 2015-10-02 DIAGNOSIS — Z91040 Latex allergy status: Secondary | ICD-10-CM

## 2015-10-02 DIAGNOSIS — Z66 Do not resuscitate: Secondary | ICD-10-CM | POA: Diagnosis present

## 2015-10-02 DIAGNOSIS — Z888 Allergy status to other drugs, medicaments and biological substances status: Secondary | ICD-10-CM

## 2015-10-02 DIAGNOSIS — Y95 Nosocomial condition: Secondary | ICD-10-CM | POA: Diagnosis present

## 2015-10-02 DIAGNOSIS — R0689 Other abnormalities of breathing: Secondary | ICD-10-CM | POA: Diagnosis not present

## 2015-10-02 DIAGNOSIS — J440 Chronic obstructive pulmonary disease with acute lower respiratory infection: Secondary | ICD-10-CM | POA: Diagnosis present

## 2015-10-02 DIAGNOSIS — Z8701 Personal history of pneumonia (recurrent): Secondary | ICD-10-CM | POA: Diagnosis not present

## 2015-10-02 DIAGNOSIS — F329 Major depressive disorder, single episode, unspecified: Secondary | ICD-10-CM | POA: Diagnosis present

## 2015-10-02 DIAGNOSIS — J189 Pneumonia, unspecified organism: Secondary | ICD-10-CM | POA: Diagnosis present

## 2015-10-02 DIAGNOSIS — G9341 Metabolic encephalopathy: Secondary | ICD-10-CM | POA: Diagnosis present

## 2015-10-02 DIAGNOSIS — R251 Tremor, unspecified: Secondary | ICD-10-CM

## 2015-10-02 DIAGNOSIS — F419 Anxiety disorder, unspecified: Secondary | ICD-10-CM | POA: Diagnosis present

## 2015-10-02 DIAGNOSIS — E2740 Unspecified adrenocortical insufficiency: Secondary | ICD-10-CM | POA: Diagnosis present

## 2015-10-02 DIAGNOSIS — M797 Fibromyalgia: Secondary | ICD-10-CM | POA: Diagnosis present

## 2015-10-02 DIAGNOSIS — J9621 Acute and chronic respiratory failure with hypoxia: Secondary | ICD-10-CM | POA: Diagnosis present

## 2015-10-02 DIAGNOSIS — Z803 Family history of malignant neoplasm of breast: Secondary | ICD-10-CM | POA: Diagnosis not present

## 2015-10-02 DIAGNOSIS — F17210 Nicotine dependence, cigarettes, uncomplicated: Secondary | ICD-10-CM | POA: Diagnosis present

## 2015-10-02 DIAGNOSIS — J9811 Atelectasis: Secondary | ICD-10-CM | POA: Diagnosis not present

## 2015-10-02 DIAGNOSIS — Z91041 Radiographic dye allergy status: Secondary | ICD-10-CM | POA: Diagnosis not present

## 2015-10-02 DIAGNOSIS — J441 Chronic obstructive pulmonary disease with (acute) exacerbation: Secondary | ICD-10-CM | POA: Diagnosis present

## 2015-10-02 DIAGNOSIS — Z801 Family history of malignant neoplasm of trachea, bronchus and lung: Secondary | ICD-10-CM

## 2015-10-02 DIAGNOSIS — J449 Chronic obstructive pulmonary disease, unspecified: Secondary | ICD-10-CM

## 2015-10-02 LAB — URINALYSIS COMPLETE WITH MICROSCOPIC (ARMC ONLY)
BACTERIA UA: NONE SEEN
BILIRUBIN URINE: NEGATIVE
BILIRUBIN URINE: NEGATIVE
Bacteria, UA: NONE SEEN
GLUCOSE, UA: NEGATIVE mg/dL
GLUCOSE, UA: NEGATIVE mg/dL
Hgb urine dipstick: NEGATIVE
Hgb urine dipstick: NEGATIVE
KETONES UR: NEGATIVE mg/dL
Ketones, ur: NEGATIVE mg/dL
Leukocytes, UA: NEGATIVE
Nitrite: NEGATIVE
Nitrite: NEGATIVE
PH: 6 (ref 5.0–8.0)
PH: 7 (ref 5.0–8.0)
Protein, ur: 30 mg/dL — AB
Protein, ur: 30 mg/dL — AB
Specific Gravity, Urine: 1.017 (ref 1.005–1.030)
Specific Gravity, Urine: 1.011 (ref 1.005–1.030)

## 2015-10-02 LAB — CBC
HCT: 38.5 % (ref 35.0–47.0)
Hemoglobin: 12.8 g/dL (ref 12.0–16.0)
MCH: 35.5 pg — AB (ref 26.0–34.0)
MCHC: 33.2 g/dL (ref 32.0–36.0)
MCV: 107.1 fL — AB (ref 80.0–100.0)
PLATELETS: 196 10*3/uL (ref 150–440)
RBC: 3.6 MIL/uL — ABNORMAL LOW (ref 3.80–5.20)
RDW: 14.5 % (ref 11.5–14.5)
WBC: 8.8 10*3/uL (ref 3.6–11.0)

## 2015-10-02 LAB — TSH: TSH: 1.164 u[IU]/mL (ref 0.350–4.500)

## 2015-10-02 LAB — BLOOD GAS, ARTERIAL
ACID-BASE EXCESS: 11.2 mmol/L — AB (ref 0.0–3.0)
ALLENS TEST (PASS/FAIL): POSITIVE — AB
Bicarbonate: 41.8 mEq/L — ABNORMAL HIGH (ref 21.0–28.0)
FIO2: 0.36
O2 Saturation: 82.5 %
PCO2 ART: 85 mmHg — AB (ref 32.0–48.0)
PH ART: 7.3 — AB (ref 7.350–7.450)
Patient temperature: 37
pO2, Arterial: 52 mmHg — ABNORMAL LOW (ref 83.0–108.0)

## 2015-10-02 LAB — COMPREHENSIVE METABOLIC PANEL
ALBUMIN: 3.3 g/dL — AB (ref 3.5–5.0)
ALT: 14 U/L (ref 14–54)
AST: 15 U/L (ref 15–41)
Alkaline Phosphatase: 57 U/L (ref 38–126)
Anion gap: 5 (ref 5–15)
BILIRUBIN TOTAL: 0.3 mg/dL (ref 0.3–1.2)
BUN: 21 mg/dL — AB (ref 6–20)
CO2: 37 mmol/L — ABNORMAL HIGH (ref 22–32)
CREATININE: 0.69 mg/dL (ref 0.44–1.00)
Calcium: 8.9 mg/dL (ref 8.9–10.3)
Chloride: 94 mmol/L — ABNORMAL LOW (ref 101–111)
GFR calc Af Amer: >60 mL/min (ref >60)
GFR calc non Af Amer: >60 mL/min (ref >60)
GLUCOSE: 100 mg/dL — AB (ref 65–99)
POTASSIUM: 4.3 mmol/L (ref 3.5–5.1)
Sodium: 136 mmol/L (ref 135–145)
TOTAL PROTEIN: 6.4 g/dL — AB (ref 6.5–8.1)

## 2015-10-02 LAB — TROPONIN I
Troponin I: 0.06 ng/mL — ABNORMAL HIGH (ref <0.031)
Troponin I: <0.03 ng/mL (ref <0.031)

## 2015-10-02 LAB — AMMONIA: AMMONIA: 19 umol/L (ref 9–35)

## 2015-10-02 MED ORDER — VITAMIN B-12 1000 MCG PO TABS
1000.0000 ug | ORAL_TABLET | Freq: Every day | ORAL | Status: DC
  Administered 2015-10-03 – 2015-10-04 (×2): 1000 ug via ORAL
  Filled 2015-10-02 (×2): qty 1

## 2015-10-02 MED ORDER — TECHNETIUM TC 99M DIETHYLENETRIAME-PENTAACETIC ACID
33.0000 | Freq: Once | INTRAVENOUS | Status: AC | PRN
Start: 2015-10-02 — End: 2015-10-02
  Administered 2015-10-02: 33 via RESPIRATORY_TRACT

## 2015-10-02 MED ORDER — VANCOMYCIN HCL IN DEXTROSE 1-5 GM/200ML-% IV SOLN
1000.0000 mg | Freq: Once | INTRAVENOUS | Status: AC
  Administered 2015-10-03: 1000 mg via INTRAVENOUS
  Filled 2015-10-02: qty 200

## 2015-10-02 MED ORDER — ALBUTEROL SULFATE (2.5 MG/3ML) 0.083% IN NEBU
3.0000 mL | INHALATION_SOLUTION | Freq: Four times a day (QID) | RESPIRATORY_TRACT | Status: DC | PRN
  Filled 2015-10-02: qty 3

## 2015-10-02 MED ORDER — FLUCONAZOLE 100 MG PO TABS
100.0000 mg | ORAL_TABLET | Freq: Every day | ORAL | Status: DC
  Administered 2015-10-03: 09:00:00 100 mg via ORAL
  Filled 2015-10-02: qty 1

## 2015-10-02 MED ORDER — CHOLECALCIFEROL 10 MCG (400 UNIT) PO TABS
800.0000 [IU] | ORAL_TABLET | Freq: Every day | ORAL | Status: DC
  Administered 2015-10-03 – 2015-10-04 (×2): 800 [IU] via ORAL
  Filled 2015-10-02: qty 2
  Filled 2015-10-02: qty 1
  Filled 2015-10-02: qty 2

## 2015-10-02 MED ORDER — SENNOSIDES-DOCUSATE SODIUM 8.6-50 MG PO TABS
1.0000 | ORAL_TABLET | Freq: Every evening | ORAL | Status: DC | PRN
Start: 2015-10-02 — End: 2015-10-04

## 2015-10-02 MED ORDER — UMECLIDINIUM-VILANTEROL 62.5-25 MCG/INH IN AEPB: 1.0000 | INHALATION_SPRAY | Freq: Every day | RESPIRATORY_TRACT | Status: DC

## 2015-10-02 MED ORDER — COSYNTROPIN 0.25 MG IJ SOLR: 0.2500 mg | Freq: Once | INTRAMUSCULAR | Status: DC

## 2015-10-02 MED ORDER — METHYLPREDNISOLONE SODIUM SUCC 125 MG IJ SOLR: 60.0000 mg | Freq: Three times a day (TID) | INTRAMUSCULAR | Status: DC

## 2015-10-02 MED ORDER — DIPHENHYDRAMINE HCL 50 MG/ML IJ SOLN
50.0000 mg | Freq: Once | INTRAMUSCULAR | Status: AC
  Administered 2015-10-02: 50 mg via INTRAVENOUS
  Filled 2015-10-02: qty 1

## 2015-10-02 MED ORDER — RISAQUAD PO CAPS
1.0000 | ORAL_CAPSULE | Freq: Every day | ORAL | Status: DC
  Administered 2015-10-03 – 2015-10-04 (×2): 1 via ORAL
  Filled 2015-10-02 (×2): qty 1

## 2015-10-02 MED ORDER — DOCUSATE SODIUM 100 MG PO CAPS
100.0000 mg | ORAL_CAPSULE | Freq: Two times a day (BID) | ORAL | Status: DC
  Administered 2015-10-03 – 2015-10-04 (×3): 100 mg via ORAL
  Filled 2015-10-02 (×3): qty 1

## 2015-10-02 MED ORDER — CYCLOBENZAPRINE HCL 10 MG PO TABS
10.0000 mg | ORAL_TABLET | Freq: Two times a day (BID) | ORAL | Status: DC
  Administered 2015-10-03 – 2015-10-04 (×4): 10 mg via ORAL
  Filled 2015-10-02 (×4): qty 1

## 2015-10-02 MED ORDER — POTASSIUM CHLORIDE CRYS ER 20 MEQ PO TBCR
20.0000 meq | EXTENDED_RELEASE_TABLET | Freq: Every day | ORAL | Status: DC
  Administered 2015-10-03 – 2015-10-04 (×2): 20 meq via ORAL
  Filled 2015-10-02 (×2): qty 1

## 2015-10-02 MED ORDER — DIAZEPAM 2 MG PO TABS
2.0000 mg | ORAL_TABLET | Freq: Four times a day (QID) | ORAL | Status: DC | PRN
  Administered 2015-10-03: 22:00:00 2 mg via ORAL
  Filled 2015-10-02: qty 1

## 2015-10-02 MED ORDER — OXYCODONE HCL 5 MG PO TABS
30.0000 mg | ORAL_TABLET | ORAL | Status: DC | PRN
  Administered 2015-10-03 – 2015-10-04 (×4): 30 mg via ORAL
  Filled 2015-10-02 (×4): qty 6

## 2015-10-02 MED ORDER — ACETAMINOPHEN 650 MG RE SUPP: 650.0000 mg | Freq: Four times a day (QID) | RECTAL | Status: DC | PRN

## 2015-10-02 MED ORDER — MORPHINE SULFATE (PF) 4 MG/ML IV SOLN: 4.0000 mg | Freq: Once | INTRAVENOUS | Status: DC

## 2015-10-02 MED ORDER — METAMUCIL PO WAFR: 1.0000 | WAFER | ORAL | Status: DC

## 2015-10-02 MED ORDER — ENOXAPARIN SODIUM 40 MG/0.4ML ~~LOC~~ SOLN
40.0000 mg | SUBCUTANEOUS | Status: DC
  Administered 2015-10-03 (×2): 40 mg via SUBCUTANEOUS
  Filled 2015-10-02 (×2): qty 0.4

## 2015-10-02 MED ORDER — DULOXETINE HCL 20 MG PO CPEP: 20.0000 mg | ORAL_CAPSULE | Freq: Every day | ORAL | Status: DC

## 2015-10-02 MED ORDER — ALUM & MAG HYDROXIDE-SIMETH 200-200-20 MG/5ML PO SUSP: 30.0000 mL | Freq: Four times a day (QID) | ORAL | Status: DC | PRN

## 2015-10-02 MED ORDER — LEVOFLOXACIN 750 MG PO TABS
750.0000 mg | ORAL_TABLET | Freq: Every day | ORAL | Status: DC
  Administered 2015-10-03: 750 mg via ORAL
  Filled 2015-10-02: qty 1

## 2015-10-02 MED ORDER — MAGNESIUM OXIDE 400 (241.3 MG) MG PO TABS
400.0000 mg | ORAL_TABLET | Freq: Every day | ORAL | Status: DC
  Administered 2015-10-03 – 2015-10-04 (×2): 400 mg via ORAL
  Filled 2015-10-02 (×2): qty 1

## 2015-10-02 MED ORDER — LORATADINE 10 MG PO TABS
10.0000 mg | ORAL_TABLET | Freq: Every day | ORAL | Status: DC
  Administered 2015-10-03 – 2015-10-04 (×2): 10 mg via ORAL
  Filled 2015-10-02 (×2): qty 1

## 2015-10-02 MED ORDER — PREDNISONE 10 MG (21) PO TBPK: 10.0000 mg | ORAL_TABLET | Freq: Every day | ORAL | Status: DC

## 2015-10-02 MED ORDER — IPRATROPIUM-ALBUTEROL 0.5-2.5 (3) MG/3ML IN SOLN: 3.0000 mL | Freq: Four times a day (QID) | RESPIRATORY_TRACT | Status: DC | PRN

## 2015-10-02 MED ORDER — URIBEL 118 MG PO CAPS: ORAL_CAPSULE | Freq: Four times a day (QID) | ORAL | Status: DC | PRN

## 2015-10-02 MED ORDER — METHYLPREDNISOLONE SODIUM SUCC 125 MG IJ SOLR
60.0000 mg | Freq: Three times a day (TID) | INTRAMUSCULAR | Status: DC
  Administered 2015-10-03 – 2015-10-04 (×6): 60 mg via INTRAVENOUS
  Filled 2015-10-02 (×6): qty 2

## 2015-10-02 MED ORDER — LIDOCAINE-PRILOCAINE 2.5-2.5 % EX KIT: 1.0000 "application " | PACK | CUTANEOUS | Status: DC | PRN

## 2015-10-02 MED ORDER — TECHNETIUM TO 99M ALBUMIN AGGREGATED
3.7800 | Freq: Once | INTRAVENOUS | Status: AC | PRN
  Administered 2015-10-02: 3.78 via INTRAVENOUS

## 2015-10-02 MED ORDER — DIPHENOXYLATE-ATROPINE 2.5-0.025 MG PO TABS: 1.0000 | ORAL_TABLET | Freq: Four times a day (QID) | ORAL | Status: DC | PRN

## 2015-10-02 MED ORDER — DIAZEPAM 5 MG PO TABS
5.0000 mg | ORAL_TABLET | ORAL | Status: DC
  Administered 2015-10-03: 5 mg via ORAL
  Filled 2015-10-02: qty 1

## 2015-10-02 MED ORDER — DOCUSATE SODIUM 50 MG PO CAPS: 50.0000 mg | ORAL_CAPSULE | Freq: Two times a day (BID) | ORAL | Status: DC

## 2015-10-02 MED ORDER — PIPERACILLIN-TAZOBACTAM 4.5 G IVPB
4.5000 g | Freq: Three times a day (TID) | INTRAVENOUS | Status: DC
  Administered 2015-10-03 (×2): 4.5 g via INTRAVENOUS
  Filled 2015-10-02 (×4): qty 100

## 2015-10-02 MED ORDER — ACETAMINOPHEN 325 MG PO TABS: 650.0000 mg | ORAL_TABLET | Freq: Four times a day (QID) | ORAL | Status: DC | PRN

## 2015-10-02 MED ORDER — DULOXETINE HCL 20 MG PO CPEP
20.0000 mg | ORAL_CAPSULE | Freq: Every day | ORAL | Status: DC
  Administered 2015-10-03 – 2015-10-04 (×2): 20 mg via ORAL
  Filled 2015-10-02 (×2): qty 1

## 2015-10-02 MED ORDER — METHYLPREDNISOLONE SODIUM SUCC 125 MG IJ SOLR
125.0000 mg | Freq: Once | INTRAMUSCULAR | Status: AC
  Administered 2015-10-02: 125 mg via INTRAVENOUS
  Filled 2015-10-02: qty 2

## 2015-10-02 MED ORDER — LIDOCAINE-PRILOCAINE 2.5-2.5 % EX CREA: TOPICAL_CREAM | CUTANEOUS | Status: DC | PRN

## 2015-10-02 MED ORDER — PSYLLIUM 95 % PO PACK
1.0000 | PACK | Freq: Every day | ORAL | Status: DC
  Administered 2015-10-03: 1 via ORAL
  Filled 2015-10-02 (×3): qty 1

## 2015-10-02 MED ORDER — TIOTROPIUM BROMIDE MONOHYDRATE 18 MCG IN CAPS
18.0000 ug | ORAL_CAPSULE | Freq: Every day | RESPIRATORY_TRACT | Status: DC
  Administered 2015-10-03 – 2015-10-04 (×2): 18 ug via RESPIRATORY_TRACT
  Filled 2015-10-02 (×2): qty 5

## 2015-10-02 MED ORDER — SODIUM CHLORIDE 0.9% FLUSH
3.0000 mL | Freq: Two times a day (BID) | INTRAVENOUS | Status: DC
  Administered 2015-10-03 – 2015-10-04 (×3): 3 mL via INTRAVENOUS

## 2015-10-02 NOTE — ED Notes (Signed)
Patient states husband was concerned because she dropped her coffee cup. History of bladder cancer with radiation and chemo finished in oct. Grips and leg strength equal. Alert and oriented.

## 2015-10-02 NOTE — ED Notes (Signed)
RT in room at this time. 
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
## 2015-10-02 NOTE — ED Notes (Signed)
Family at bedside. 
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
## 2015-10-02 NOTE — Plan of Care (Signed)
Patient will attempt wear BIPAP for sleep tonight to help with Co2 retention. Dr. Marcille Blanco notified. Patient states she just started using a home machine. Patient states no shortness of breath at this time. Patient on 4 liter nasal cannula.

## 2015-10-02 NOTE — ED Notes (Signed)
Dr. Reita Cliche notified of critical Troponin result of 0.06.

## 2015-10-03 DIAGNOSIS — J9811 Atelectasis: Secondary | ICD-10-CM

## 2015-10-03 DIAGNOSIS — R0902 Hypoxemia: Secondary | ICD-10-CM

## 2015-10-03 DIAGNOSIS — E2740 Unspecified adrenocortical insufficiency: Secondary | ICD-10-CM

## 2015-10-03 DIAGNOSIS — R0689 Other abnormalities of breathing: Secondary | ICD-10-CM

## 2015-10-03 DIAGNOSIS — J441 Chronic obstructive pulmonary disease with (acute) exacerbation: Secondary | ICD-10-CM

## 2015-10-03 LAB — BLOOD CULTURE ID PANEL (REFLEXED)
Acinetobacter baumannii: NOT DETECTED
CANDIDA KRUSEI: NOT DETECTED
CARBAPENEM RESISTANCE: NOT DETECTED
Candida albicans: NOT DETECTED
Candida glabrata: NOT DETECTED
Candida parapsilosis: NOT DETECTED
Candida tropicalis: NOT DETECTED
ENTEROCOCCUS SPECIES: NOT DETECTED
ESCHERICHIA COLI: NOT DETECTED
Enterobacter cloacae complex: NOT DETECTED
Enterobacteriaceae species: NOT DETECTED
Haemophilus influenzae: NOT DETECTED
Klebsiella oxytoca: NOT DETECTED
Klebsiella pneumoniae: NOT DETECTED
LISTERIA MONOCYTOGENES: NOT DETECTED
METHICILLIN RESISTANCE: NOT DETECTED
NEISSERIA MENINGITIDIS: NOT DETECTED
PSEUDOMONAS AERUGINOSA: NOT DETECTED
Proteus species: NOT DETECTED
SERRATIA MARCESCENS: NOT DETECTED
STAPHYLOCOCCUS AUREUS BCID: NOT DETECTED
STAPHYLOCOCCUS SPECIES: NOT DETECTED
STREPTOCOCCUS PYOGENES: NOT DETECTED
STREPTOCOCCUS SPECIES: DETECTED — AB
Streptococcus agalactiae: NOT DETECTED
Streptococcus pneumoniae: NOT DETECTED
VANCOMYCIN RESISTANCE: NOT DETECTED

## 2015-10-03 LAB — CBC
HEMATOCRIT: 37.3 % (ref 35.0–47.0)
Hemoglobin: 12.6 g/dL (ref 12.0–16.0)
MCH: 35.3 pg — ABNORMAL HIGH (ref 26.0–34.0)
MCHC: 33.8 g/dL (ref 32.0–36.0)
MCV: 104.4 fL — ABNORMAL HIGH (ref 80.0–100.0)
PLATELETS: 205 10*3/uL (ref 150–440)
RBC: 3.57 MIL/uL — AB (ref 3.80–5.20)
RDW: 14.6 % — AB (ref 11.5–14.5)
WBC: 7.2 10*3/uL (ref 3.6–11.0)

## 2015-10-03 LAB — BASIC METABOLIC PANEL
ANION GAP: 3 — AB (ref 5–15)
BUN: 20 mg/dL (ref 6–20)
CALCIUM: 9 mg/dL (ref 8.9–10.3)
CO2: 41 mmol/L — AB (ref 22–32)
Chloride: 92 mmol/L — ABNORMAL LOW (ref 101–111)
Creatinine, Ser: 0.59 mg/dL (ref 0.44–1.00)
Glucose, Bld: 120 mg/dL — ABNORMAL HIGH (ref 65–99)
POTASSIUM: 4.3 mmol/L (ref 3.5–5.1)
Sodium: 136 mmol/L (ref 135–145)

## 2015-10-03 LAB — MRSA PCR SCREENING: MRSA by PCR: NEGATIVE

## 2015-10-03 MED ORDER — URELLE 81 MG PO TABS
1.0000 | ORAL_TABLET | Freq: Four times a day (QID) | ORAL | Status: DC | PRN
  Filled 2015-10-03: qty 1

## 2015-10-03 MED ORDER — MOMETASONE FURO-FORMOTEROL FUM 200-5 MCG/ACT IN AERO
2.0000 | INHALATION_SPRAY | Freq: Two times a day (BID) | RESPIRATORY_TRACT | Status: DC
  Administered 2015-10-03 – 2015-10-04 (×2): 2 via RESPIRATORY_TRACT
  Filled 2015-10-03: qty 8.8

## 2015-10-03 MED ORDER — DEXTROSE 5 % IV SOLN
2.0000 g | INTRAVENOUS | Status: DC
  Administered 2015-10-03: 2 g via INTRAVENOUS
  Filled 2015-10-03 (×2): qty 2

## 2015-10-03 MED ORDER — VANCOMYCIN HCL IN DEXTROSE 1-5 GM/200ML-% IV SOLN
1000.0000 mg | Freq: Two times a day (BID) | INTRAVENOUS | Status: DC
  Administered 2015-10-03: 09:00:00 1000 mg via INTRAVENOUS
  Filled 2015-10-03 (×2): qty 200
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
## 2015-10-03 NOTE — Evaluation (Signed)
Physical Therapy Evaluation Patient Details Name: Rebecca Holland MRN: [id number] DOB: 1951-04-07 Today's Date: 10/03/2015   History of Present Illness  Pt is a 65 y.o. female with PMH of COPD, stable abdominal aortic aneurysm, adrenal insufficiancy and bladder cancer.  Pt presented with weakness and lethargy and admitted with acute on chronic hypoxic/hypercapnic respiratory failure and acute metabolic encephalopathy.      Clinical Impression  Prior to admission pt reports being independent with intermittent AD use.  Pt lives with husband.  Pt was independent with bed mobility, min guard with sit to stand and min assist with hand held assist during ambulation (180 feet).  Pt required verbal cueing during ambulation due to decreased safety awareness.  Pt vitals were [facility] during session (O2 saturation: 94% O2; HR: 74 bpm) on 3 L/min O2 via nasal cannula.  Due to aforementioned function and strength deficits, pt is in need of skilled physical therapy.  It is recommended that pt is discharged to home with 24/7 assist and home health PT when medically appropriate.     Follow Up Recommendations Home health PT pending pt's progress; (24/7 assist )    Equipment Recommendations       Recommendations for Other Services       Precautions / Restrictions Precautions Precautions: Fall Restrictions Weight Bearing Restrictions: No      Mobility  Bed Mobility Overal bed mobility: Independent                Transfers Overall transfer level: Needs assistance   Transfers: Sit to/from Stand Sit to Stand: Min guard  Toilet transfer: CGA with vc's for safety          Ambulation/Gait Ambulation/Gait assistance: Min assist Ambulation Distance (Feet): 180 Feet Assistive device: 1 person hand held assist (IV pole also held intermittently ) Gait Pattern/deviations: Narrow base of support Gait velocity: decreased  Vc's required for safety    Stairs            Wheelchair  Mobility    Modified Rankin (Stroke Patients Only)       Balance Overall balance assessment: Needs assistance Sitting-balance support: Feet supported Sitting balance-Leahy Scale: Normal     Standing balance support: Single extremity supported (IV Pole ) Standing balance-Leahy Scale: Good                               Pertinent Vitals/Pain Pain Assessment: No/denies pain  See flow sheet for vitals.     Home Living Family/patient expects to be discharged to:: Private residence Living Arrangements: Spouse/significant other Available Help at Discharge: Family Type of Home: House Home Access: Stairs to enter Entrance Stairs-Rails: None Entrance Stairs-Number of Steps: 2 Home Layout: One level Home Equipment: Environmental consultant - 2 wheels;Cane - single point;Bedside commode;Tub bench      Prior Function Level of Independence: Needs assistance   Gait / Transfers Assistance Needed: Independent with walker vs SPC vs no AD           Hand Dominance   Dominant Hand: Right    Extremity/Trunk Assessment   Upper Extremity Assessment: Overall WFL for tasks assessed           Lower Extremity Assessment: Generalized weakness      Cervical / Trunk Assessment: Normal  Communication   Communication: No difficulties  Cognition Arousal/Alertness: Awake/alert Behavior During Therapy: Restless; occasional "short" with PT Overall Cognitive Status: Within Functional Limits for tasks assessed  General Comments   Nursing was contacted and cleared pt for physical therapy.  Pt agreeable to PT session and pt's husband present for part of session.     Exercises        Assessment/Plan    PT Assessment Patient needs continued PT services  PT Diagnosis Generalized weakness   PT Problem List Decreased strength;Decreased activity tolerance;Decreased balance;Decreased mobility;Decreased safety awareness  PT Treatment Interventions DME  instruction;Gait training;Stair training;Functional mobility training;Therapeutic activities;Therapeutic exercise;Patient/family education;Balance training   PT Goals (Current goals can be found in the Care Plan section) Acute Rehab PT Goals Patient Stated Goal: to go home  PT Goal Formulation: With patient Time For Goal Achievement: 10/03/15 Potential to Achieve Goals: Good    Frequency Min 2X/week   Barriers to discharge        Co-evaluation               End of Session Equipment Utilized During Treatment: Gait belt;Oxygen (3 L/min)   Patient left: in bed;with call bell/phone within reach;with bed alarm set;with family/visitor present Nurse Communication: Mobility status         Time: [id number] PT Time Calculation (min) (ACUTE ONLY): 25 min   Charges:         PT G Codes:       Mittie Bodo, SPT Mittie Bodo 10/03/2015, 11:45 AM

## 2015-10-03 NOTE — Progress Notes (Signed)
Pharmacy Antibiotic Note  Rebecca Holland is a 65 y.o. female admitted on 10/02/2015 with pneumonia.  Pharmacy has been consulted for Vancomycin and Zosyn dosing.  Plan: BCID results were discussed with Dr. Manuella Ghazi and the patient's antibiotics will be changed to Ceftriaxone 2g IV q24h. Further narrowing will be determined based on finalized susceptibilities.  Height: 5\' 8"  (172.7 cm) Weight: 153 lb (69.4 kg) IBW/kg (Calculated) : 63.9  Temp (24hrs), Avg:97.6 F (36.4 C), Min:97.4 F (36.3 C), Max:98 F (36.7 C)   Recent Labs Lab 10/02/15 1120 10/03/15 0636  WBC 8.8 7.2  CREATININE 0.69 0.59    Estimated Creatinine Clearance: 70.7 mL/min (by C-G formula based on Cr of 0.59).    Allergies  Allergen Reactions  . Iohexol Anaphylaxis  . Betadine [Povidone Iodine] Itching  . Nsaids Diarrhea  . Ondansetron Other (See Comments)    Reaction:  Makes pt weak and tired  . Tape Other (See Comments)    Reaction:  Peels pts skin off   . Latex Rash  . Sulfa Antibiotics Rash    Antimicrobials this admission: Vancomycin 2/20 >> 2/21 Zosyn 2/20 >> 2/21 Levaquin 2/20 >> 2/21 Ceftriaxone 2/21 >>  Dose adjustments this admission:  Microbiology results: 2/20 BCx: BCID results show Strep species in anaerobic bottle of one set (drawn from port-a-cath)  Thank you for allowing pharmacy to be a part of this patient's care.  Paulina Fusi, PharmD, BCPS 10/03/2015 4:21 PM

## 2015-10-03 NOTE — Consult Note (Signed)
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 tobacco -May need supplemental oxygen to maintain O2 saturations greater than 88% -Given the frequency of her exacerbations, she could have worsening bronchospasms and overlap syndrome, she will benefit from BiPAP or CPAP at night, recommend noninvasive positive pressure ventilation (BiPAP or CPAP) at night while inpatient only. Minimum 4 hours -Continue with DuoNeb's -Continue with Spiriva  - stop Anoro (not taking due to cost), Start dulera.  -Follow-up sputum culture -VQ scan low probability for PE -Incentive spirometer and chest physiotherapy daily  Suspected adrenal insufficiency -Currently pending ACTH stimulation test -Endocrinology following -Prednisone may assist however florinef may be more useful, especially after prednisone taper is over  CO2 narcosis - secondary to COPD - now resolved with NIVPPV, cont with NIVPPV during inpatient stay - patient states that she has NIVPPV at home, but could not remember what I kind she has, nor does  she use it on a nightly basis at home.   Hypoxia - resolving well - cont to maintain O2 sat >88%  I  have personally obtained a history, examined the patient, evaluated laboratory and imaging results, formulated the assessment and plan and placed orders. Pulmonary Consult Time devoted to patient care services described in this note is 45 minutes.    Vilinda Boehringer, MD [facility] Pager [phone] (please enter 7-digits) On Call Pager [phone] (please enter 7-digits)     10/03/2015, 1:29 PM  Note: This note was prepared with Dragon dictation along with smaller phrase technology. Any transcriptional errors that result from this process are unintentional.

## 2015-10-03 NOTE — Progress Notes (Signed)
Moderate fall risk. Pt refuses bed alarm per nursing judgement. Safe environment provided.

## 2015-10-03 NOTE — Progress Notes (Addendum)
Pharmacy Antibiotic Note  Rebecca Holland is a 65 y.o. female admitted on 10/02/2015 with pneumonia.  Pharmacy has been consulted for vancomycin and Zosyn dosing.  Imaging: bronchopneumonia  Plan: TBW 65.3kg  IBW 63.9kg  DW 65.3kg  Vd 46L kei 0.063 hr-1  t1/2 11 hours. Vancomycin 1 gram q 12 hours ordered with stacked dosing. Level before 5th dose. Goal 15-20.  Zosyn 4.5 grams q 8 hours ordered for Pseudomonas risk of recent abx (azithromycin) received as outpatient.    Height: 5\' 8"  (172.7 cm) Weight: 153 lb (69.4 kg) IBW/kg (Calculated) : 63.9  Temp (24hrs), Avg:97.8 F (36.6 C), Min:97.6 F (36.4 C), Max:98 F (36.7 C)   Recent Labs Lab 10/02/15 1120  WBC 8.8  CREATININE 0.69    Estimated Creatinine Clearance: 70.7 mL/min (by C-G formula based on Cr of 0.69).    Allergies  Allergen Reactions  . Iohexol Anaphylaxis  . Betadine [Povidone Iodine] Itching  . Nsaids Diarrhea  . Ondansetron Other (See Comments)    Reaction:  Makes pt weak and tired  . Tape Other (See Comments)    Reaction:  Peels pts skin off   . Latex Rash  . Sulfa Antibiotics Rash    Antimicrobials this admission:   Dose adjustments this admission:   Microbiology results: 10/02/15 BCx: pending 10/02/15 Sputum: pending  07/18/15 MRSA PCR: (-)  Thank you for allowing pharmacy to be a part of this patient's care.  Breydan Shillingburg S 10/03/2015 1:52 AM
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
## 2015-10-04 ENCOUNTER — Inpatient Hospital Stay: Payer: Medicare Other

## 2015-10-04 DIAGNOSIS — D7589 Other specified diseases of blood and blood-forming organs: Secondary | ICD-10-CM | POA: Insufficient documentation

## 2015-10-04 LAB — BASIC METABOLIC PANEL
ANION GAP: 3 — AB (ref 5–15)
BUN: 19 mg/dL (ref 6–20)
CALCIUM: 8.9 mg/dL (ref 8.9–10.3)
CO2: 38 mmol/L — AB (ref 22–32)
Chloride: 95 mmol/L — ABNORMAL LOW (ref 101–111)
Creatinine, Ser: 0.57 mg/dL (ref 0.44–1.00)
GFR calc Af Amer: >60 mL/min (ref >60)
GFR calc non Af Amer: >60 mL/min (ref >60)
GLUCOSE: 131 mg/dL — AB (ref 65–99)
Potassium: 4 mmol/L (ref 3.5–5.1)
Sodium: 136 mmol/L (ref 135–145)

## 2015-10-04 LAB — CBC
HEMATOCRIT: 36.1 % (ref 35.0–47.0)
Hemoglobin: 12.3 g/dL (ref 12.0–16.0)
MCH: 35.2 pg — AB (ref 26.0–34.0)
MCHC: 33.9 g/dL (ref 32.0–36.0)
MCV: 103.7 fL — AB (ref 80.0–100.0)
Platelets: 197 10*3/uL (ref 150–440)
RBC: 3.48 MIL/uL — ABNORMAL LOW (ref 3.80–5.20)
RDW: 14.3 % (ref 11.5–14.5)
WBC: 8.7 10*3/uL (ref 3.6–11.0)

## 2015-10-04 MED ORDER — CLINDAMYCIN HCL 300 MG PO CAPS: 300.0000 mg | ORAL_CAPSULE | Freq: Three times a day (TID) | ORAL | Status: DC

## 2015-10-04 MED ORDER — PREDNISONE 10 MG PO TABS: ORAL_TABLET | ORAL | Status: DC

## 2015-10-04 MED ORDER — HEPARIN SOD (PORK) LOCK FLUSH 100 UNIT/ML IV SOLN
500.0000 [IU] | Freq: Once | INTRAVENOUS | Status: AC
  Administered 2015-10-04: 500 [IU] via INTRAVENOUS
  Filled 2015-10-04: qty 5

## 2015-10-04 NOTE — Care Management (Signed)
Discharge to home today per Dr. Manuella Ghazi. Lives with husband, Nicki Reaper, [phone]). Takes care of all basic activities of daily living herself. Husband does the errands. Uses no aids for ambulation. Physical therapy evaluation completed a few days ago. Recommends home health/physical therapy. Ms. Scarff up in room without any assistance. Walking out in halls without assistance.  No follow-up needs at this time. Husband will transport, Shelbie Ammons RN MSN CCM Care Management [phone]

## 2015-10-04 NOTE — Discharge Instructions (Signed)
Aspiration Pneumonia  Aspiration pneumonia is an infection in your lungs. It occurs when food, liquid, or stomach contents (vomit) are inhaled (aspirated) into your lungs. When these things get into your lungs, swelling (inflammation) and infection can occur. This can make it difficult for you to breathe. Aspiration pneumonia is a serious condition and can be life threatening. RISK FACTORS Aspiration pneumonia is more likely to occur when a person's cough (gag) reflex or ability to swallow has been decreased. Some things that can do this include:   Having a brain injury or disease, such as stroke, seizures, Parkinson's disease, dementia, or amyotrophic lateral sclerosis (ALS).   Being given general anesthetic for procedures.   Being in a coma (unconscious).   Having a narrowing of the tube that carries food to the stomach (esophagus).   Drinking too much alcohol. If a person passes out and vomits, vomit can be swallowed into the lungs.   Taking certain medicines, such as tranquilizers or sedatives.  SIGNS AND SYMPTOMS   Coughing after swallowing food or liquids.   Breathing problems, such as wheezing or shortness of breath.   Bluish skin. This can be caused by lack of oxygen.   Coughing up food or mucus. The mucus might contain blood, greenish material, or yellowish-white fluid (pus).   Fever.   Chest pain.   Being more tired than usual (fatigue).   Sweating more than usual.   Bad breath.  DIAGNOSIS  A physical exam will be done. During the exam, the health care provider will listen to your lungs with a stethoscope to check for:   Crackling sounds in the lungs.  Decreased breath sounds.  A rapid heartbeat. Various tests may be ordered. These may include:   Chest X-ray.   CT scan.   Swallowing study. This test looks at how food is swallowed and whether it goes into your breathing tube (trachea) or food pipe (esophagus).   Sputum culture. Saliva and  mucus (sputum) are collected from the lungs or the tubes that carry air to the lungs (bronchi). The sputum is then tested for bacteria.   Bronchoscopy. This test uses a flexible tube (bronchoscope) to see inside the lungs. TREATMENT  Treatment will usually include antibiotic medicines. Other medicines may also be used to reduce fever or pain. You may need to be treated in the hospital. In the hospital, your breathing will be carefully monitored. Depending on how well you are breathing, you may need to be given oxygen, or you may need breathing support from a breathing machine (ventilator). For people who fail a swallowing study, a feeding tube might be placed in the stomach, or they may be asked to avoid certain food textures or liquids when they eat. HOME CARE INSTRUCTIONS   Carefully follow any special eating instructions you were given, such as avoiding certain food textures or thickening liquids. This reduces the risk of developing aspiration pneumonia again.  Only take over-the-counter or prescription medicines as directed by your health care provider. Follow the directions carefully.   If you were prescribed antibiotics, take them as directed. Finish them even if you start to feel better.   Rest as instructed by your health care provider.   Keep all follow-up appointments with your health care provider.  SEEK MEDICAL CARE IF:   You develop worsening shortness of breath, wheezing, or difficulty breathing.   You develop a fever.   You have chest pain.  MAKE SURE YOU:   Understand these instructions.  Will watch   your condition.  Will get help right away if you are not doing well or get worse.   This information is not intended to replace advice given to you by your health care provider. Make sure you discuss any questions you have with your health care provider.   Document Released: 05/26/2009 Document Revised: 08/03/2013 Document Reviewed: 01/14/2013 Elsevier  Interactive Patient Education 2016 [employer].  

## 2015-10-04 NOTE — Progress Notes (Signed)
Security returned patient belongings. Madlyn Frankel, RN

## 2015-10-04 NOTE — Progress Notes (Signed)
Discharge instructions given and went over with patient and husband at bedside. All questions answered. Patient discharged home with husband via wheelchair by nursing staff. Madlyn Frankel, RN

## 2015-10-04 NOTE — Care Management Important Message (Signed)
Important Message  Patient Details  Name: Rebecca Holland MRN: [id number] Date of Birth: Mar 23, 1951   Medicare Important Message Given:  Yes    Juliann Pulse A Ravynn Hogate 10/04/2015, 10:44 AM

## 2015-10-05 LAB — CULTURE, BLOOD (ROUTINE X 2)

## 2015-10-06 ENCOUNTER — Telehealth: Payer: Self-pay | Admitting: *Deleted

## 2015-10-06 NOTE — Telephone Encounter (Signed)
Message sent to scheduling and patient informed of cancellation

## 2015-10-06 NOTE — Telephone Encounter (Signed)
Called to report she was just discharged from hospital with pneumonia and that she is on steroids and abx. She is scheduled for a PET scan on Monday and is wondering if it should be rescheduled until her health has improved. Please advise

## 2015-10-06 NOTE — Telephone Encounter (Signed)
  Better to reschedule PET scan until fully recovered (at least 2 weeks).  M

## 2015-10-07 LAB — CULTURE, BLOOD (ROUTINE X 2): CULTURE: NO GROWTH
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
## 2015-10-09 ENCOUNTER — Encounter: Admission: RE | Admit: 2015-10-09 | Payer: Medicare Other | Source: Ambulatory Visit

## 2015-10-12 ENCOUNTER — Ambulatory Visit: Payer: Medicare Other | Admitting: Hematology and Oncology

## 2015-10-16 ENCOUNTER — Inpatient Hospital Stay: Payer: Medicare Other | Admitting: Pulmonary Disease

## 2015-10-23 ENCOUNTER — Ambulatory Visit
Admission: RE | Admit: 2015-10-23 | Discharge: 2015-10-23 | Disposition: A | Discharge status: Home / Self Care | Payer: Medicare Other | Source: Ambulatory Visit | Attending: Hematology and Oncology | Admitting: Hematology and Oncology

## 2015-10-23 DIAGNOSIS — R918 Other nonspecific abnormal finding of lung field: Secondary | ICD-10-CM | POA: Insufficient documentation

## 2015-10-23 DIAGNOSIS — I714 Abdominal aortic aneurysm, without rupture: Secondary | ICD-10-CM | POA: Diagnosis not present

## 2015-10-23 DIAGNOSIS — C679 Malignant neoplasm of bladder, unspecified: Secondary | ICD-10-CM | POA: Insufficient documentation

## 2015-10-23 DIAGNOSIS — I700 Atherosclerosis of aorta: Secondary | ICD-10-CM | POA: Diagnosis not present

## 2015-10-23 LAB — GLUCOSE, CAPILLARY: Glucose-Capillary: 81 mg/dL (ref 65–99)

## 2015-10-23 MED ORDER — FLUDEOXYGLUCOSE F - 18 (FDG) INJECTION
12.3800 | Freq: Once | INTRAVENOUS | Status: AC | PRN
  Administered 2015-10-23: 12.38 via INTRAVENOUS

## 2015-10-26 ENCOUNTER — Inpatient Hospital Stay: Payer: Medicare Other | Attending: Hematology and Oncology | Admitting: Hematology and Oncology

## 2015-10-26 VITALS — BP 108/67 | HR 85 | Temp 98.1°F | Resp 16 | Wt 149.0 lb

## 2015-10-26 DIAGNOSIS — Z923 Personal history of irradiation: Secondary | ICD-10-CM

## 2015-10-26 DIAGNOSIS — C679 Malignant neoplasm of bladder, unspecified: Secondary | ICD-10-CM | POA: Diagnosis present

## 2015-10-26 DIAGNOSIS — Z9981 Dependence on supplemental oxygen: Secondary | ICD-10-CM

## 2015-10-26 DIAGNOSIS — I714 Abdominal aortic aneurysm, without rupture: Secondary | ICD-10-CM | POA: Diagnosis not present

## 2015-10-26 DIAGNOSIS — Z79899 Other long term (current) drug therapy: Secondary | ICD-10-CM | POA: Diagnosis not present

## 2015-10-26 DIAGNOSIS — G9341 Metabolic encephalopathy: Secondary | ICD-10-CM | POA: Diagnosis not present

## 2015-10-26 DIAGNOSIS — Z7952 Long term (current) use of systemic steroids: Secondary | ICD-10-CM | POA: Insufficient documentation

## 2015-10-26 DIAGNOSIS — K219 Gastro-esophageal reflux disease without esophagitis: Secondary | ICD-10-CM | POA: Diagnosis not present

## 2015-10-26 DIAGNOSIS — I129 Hypertensive chronic kidney disease with stage 1 through stage 4 chronic kidney disease, or unspecified chronic kidney disease: Secondary | ICD-10-CM

## 2015-10-26 DIAGNOSIS — N189 Chronic kidney disease, unspecified: Secondary | ICD-10-CM | POA: Diagnosis not present

## 2015-10-26 DIAGNOSIS — I700 Atherosclerosis of aorta: Secondary | ICD-10-CM

## 2015-10-26 DIAGNOSIS — Z792 Long term (current) use of antibiotics: Secondary | ICD-10-CM

## 2015-10-26 DIAGNOSIS — F17210 Nicotine dependence, cigarettes, uncomplicated: Secondary | ICD-10-CM | POA: Insufficient documentation

## 2015-10-26 DIAGNOSIS — J189 Pneumonia, unspecified organism: Secondary | ICD-10-CM

## 2015-10-26 DIAGNOSIS — J45909 Unspecified asthma, uncomplicated: Secondary | ICD-10-CM

## 2015-10-26 DIAGNOSIS — C7919 Secondary malignant neoplasm of other urinary organs: Secondary | ICD-10-CM

## 2015-10-26 DIAGNOSIS — J449 Chronic obstructive pulmonary disease, unspecified: Secondary | ICD-10-CM

## 2015-10-26 DIAGNOSIS — D7589 Other specified diseases of blood and blood-forming organs: Secondary | ICD-10-CM

## 2015-10-26 DIAGNOSIS — D751 Secondary polycythemia: Secondary | ICD-10-CM

## 2015-10-26 DIAGNOSIS — M797 Fibromyalgia: Secondary | ICD-10-CM | POA: Diagnosis not present
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
## 2015-10-26 NOTE — Progress Notes (Signed)
Patient reports feeling great this week.  She went to ER on 10/02/15 with weakness and trouble breathing and was admitted for pneumonia that is currently being treated with Clindamycin.

## 2015-10-27 ENCOUNTER — Encounter: Payer: Self-pay | Admitting: Hematology and Oncology

## 2015-11-14 ENCOUNTER — Encounter: Payer: Self-pay | Admitting: Pulmonary Disease

## 2015-11-14 ENCOUNTER — Other Ambulatory Visit: Payer: Self-pay | Admitting: *Deleted

## 2015-11-14 ENCOUNTER — Ambulatory Visit (INDEPENDENT_AMBULATORY_CARE_PROVIDER_SITE_OTHER): Payer: Medicare Other | Admitting: Pulmonary Disease

## 2015-11-14 VITALS — BP 118/72 | HR 85 | Ht 67.0 in | Wt 144.0 lb

## 2015-11-14 DIAGNOSIS — R0902 Hypoxemia: Secondary | ICD-10-CM

## 2015-11-14 DIAGNOSIS — F17200 Nicotine dependence, unspecified, uncomplicated: Secondary | ICD-10-CM

## 2015-11-14 DIAGNOSIS — J449 Chronic obstructive pulmonary disease, unspecified: Secondary | ICD-10-CM

## 2015-11-14 DIAGNOSIS — Z720 Tobacco use: Secondary | ICD-10-CM

## 2015-11-14 DIAGNOSIS — J45909 Unspecified asthma, uncomplicated: Secondary | ICD-10-CM

## 2015-11-14 MED ORDER — BUDESONIDE 0.5 MG/2ML IN SUSP: 0.5000 mg | Freq: Two times a day (BID) | RESPIRATORY_TRACT | Status: DC

## 2015-11-14 MED ORDER — ARFORMOTEROL TARTRATE 15 MCG/2ML IN NEBU: 15.0000 ug | INHALATION_SOLUTION | Freq: Two times a day (BID) | RESPIRATORY_TRACT | Status: DC

## 2015-11-14 MED ORDER — VARENICLINE TARTRATE 1 MG PO TABS: 1.0000 mg | ORAL_TABLET | Freq: Two times a day (BID) | ORAL | Status: DC

## 2015-11-14 NOTE — Patient Instructions (Addendum)
You must stop smoking if we are to get control of your lung problems Begin budesonide (Pulmicort) in the nebulizer machine - use twice a day Begin arformoterol (Brovana) in the nebulizer machine - twice a day Continue prednisone 5 mg daily Continue Spiriva Continue Duoneb or albuterol inhaler as needed Continue Chantix for at least 6 weeks after your last cigarette Follow up with me in 4-6 weeks

## 2015-11-16 NOTE — Progress Notes (Signed)
PROBLEMS: Severe COPD  INTERVAL HISTORY: Hospitalized 02/20 - 10/04/15 for acute hypercarbic respiratory failure due to AECOPD. Seen by Dr Stevenson Clinch in consultation  SUBJ: She is recovering as expected from recent hospitalization. She was discharged on Spiriva, nebulized alb/ipratropium and prednisone taper. She continues to smoke approx 7 cigs per day. She is on Chantix. Recent diagnosis of bladder cancer noted.   OBJ: Filed Vitals:   11/14/15 0933  BP: 118/72  Pulse: 85  Height: 5\' 7"  (1.702 m)  Weight: 144 lb (65.318 kg)  SpO2: 93%   NAD HEENT WNL JVP not well visualized Distant BS, no wheezes Reg, no M NABS, soft No LE edema  DATA: Recent CXRs reviewed   IMPRESSION: COPD with recent AECOPD Asthmatic bronchitis Smoker Very high risk of repeated admissions for same. She has poor insight into role of smoking in her poor health  PLAN: Continue Spiriva Begin Budesonide 0.5 mg nebulized BID Begin arformetorol 15 mcg nebulized BID Continue Duoneb PRN Counseled @ length re: need for total smoking cessation Sleep study has already been ordered - doubt OSA ROV 6 weeks   Merton Border, MD PCCM service Mobile [phone] Pager [phone] 11/16/2015

## 2015-11-23 ENCOUNTER — Inpatient Hospital Stay: Payer: Medicare Other | Attending: Hematology and Oncology

## 2015-11-23 ENCOUNTER — Inpatient Hospital Stay: Payer: Medicare Other

## 2015-11-23 DIAGNOSIS — Z452 Encounter for adjustment and management of vascular access device: Secondary | ICD-10-CM | POA: Diagnosis not present

## 2015-11-23 DIAGNOSIS — C679 Malignant neoplasm of bladder, unspecified: Secondary | ICD-10-CM | POA: Insufficient documentation

## 2015-11-23 DIAGNOSIS — Z9221 Personal history of antineoplastic chemotherapy: Secondary | ICD-10-CM | POA: Insufficient documentation

## 2015-11-23 DIAGNOSIS — C7919 Secondary malignant neoplasm of other urinary organs: Secondary | ICD-10-CM | POA: Diagnosis not present

## 2015-11-23 DIAGNOSIS — Z95828 Presence of other vascular implants and grafts: Secondary | ICD-10-CM

## 2015-11-23 DIAGNOSIS — Z923 Personal history of irradiation: Secondary | ICD-10-CM | POA: Insufficient documentation

## 2015-11-23 MED ORDER — SODIUM CHLORIDE 0.9% FLUSH
10.0000 mL | INTRAVENOUS | Status: AC | PRN
  Administered 2015-11-23: 10 mL via INTRAVENOUS
  Filled 2015-11-23: qty 10

## 2015-11-23 MED ORDER — HEPARIN SOD (PORK) LOCK FLUSH 100 UNIT/ML IV SOLN
500.0000 [IU] | Freq: Once | INTRAVENOUS | Status: AC
  Administered 2015-11-23: 500 [IU] via INTRAVENOUS

## 2015-11-23 NOTE — Progress Notes (Unsigned)
Survivorship Care Plan visit completed.  Treatment summary reviewed and given to patient.  ASCO answers booklet reviewed and given to patient.  Encouraged patient to attend free smoking cessation program offered by cancer center.  Discussed Cancer Transitions and CARE program with patient and son.  Patient verbalized understanding.

## 2015-11-27 ENCOUNTER — Telehealth: Payer: Self-pay | Admitting: *Deleted

## 2015-11-27 NOTE — Telephone Encounter (Signed)
PA for Chantix submitted thru CMM. Key: [id number]  Will await response.  Envision ID: [id number]

## 2015-11-28 NOTE — Telephone Encounter (Signed)
Chantix 1mg  tab has been approved from 11/27/2015 to 02/25/2016. Pharmacy informed.

## 2015-12-18 ENCOUNTER — Encounter: Payer: Self-pay | Admitting: *Deleted

## 2015-12-18 NOTE — Pre-Procedure Instructions (Signed)
OR NOTIFIED LATEX ALLERGY. PATIENT WEARS DEVICE AT NIGHT TO DECREASE CO2 THAT IS NOT CPAP OR BIPAP

## 2015-12-24 NOTE — H&P (Signed)
See scanned note.

## 2015-12-25 ENCOUNTER — Ambulatory Visit: Payer: Medicare Other | Admitting: Anesthesiology

## 2015-12-25 ENCOUNTER — Ambulatory Visit
Admission: RE | Admit: 2015-12-25 | Discharge: 2015-12-25 | Disposition: A | Discharge status: Home / Self Care | Payer: Medicare Other | Source: Ambulatory Visit | Attending: Ophthalmology | Admitting: Ophthalmology

## 2015-12-25 ENCOUNTER — Encounter
Admission: RE | Disposition: A | Discharge status: Home / Self Care | Payer: Self-pay | Source: Ambulatory Visit | Attending: Ophthalmology

## 2015-12-25 ENCOUNTER — Encounter: Payer: Self-pay | Admitting: *Deleted

## 2015-12-25 DIAGNOSIS — G8929 Other chronic pain: Secondary | ICD-10-CM | POA: Insufficient documentation

## 2015-12-25 DIAGNOSIS — M810 Age-related osteoporosis without current pathological fracture: Secondary | ICD-10-CM | POA: Insufficient documentation

## 2015-12-25 DIAGNOSIS — Z882 Allergy status to sulfonamides status: Secondary | ICD-10-CM | POA: Diagnosis not present

## 2015-12-25 DIAGNOSIS — H2511 Age-related nuclear cataract, right eye: Secondary | ICD-10-CM | POA: Diagnosis not present

## 2015-12-25 DIAGNOSIS — Z880 Allergy status to penicillin: Secondary | ICD-10-CM | POA: Insufficient documentation

## 2015-12-25 DIAGNOSIS — K219 Gastro-esophageal reflux disease without esophagitis: Secondary | ICD-10-CM | POA: Insufficient documentation

## 2015-12-25 DIAGNOSIS — F329 Major depressive disorder, single episode, unspecified: Secondary | ICD-10-CM | POA: Diagnosis not present

## 2015-12-25 DIAGNOSIS — J449 Chronic obstructive pulmonary disease, unspecified: Secondary | ICD-10-CM | POA: Diagnosis not present

## 2015-12-25 DIAGNOSIS — K449 Diaphragmatic hernia without obstruction or gangrene: Secondary | ICD-10-CM | POA: Insufficient documentation

## 2015-12-25 DIAGNOSIS — M797 Fibromyalgia: Secondary | ICD-10-CM | POA: Diagnosis not present

## 2015-12-25 DIAGNOSIS — G43909 Migraine, unspecified, not intractable, without status migrainosus: Secondary | ICD-10-CM | POA: Diagnosis not present

## 2015-12-25 DIAGNOSIS — Z8711 Personal history of peptic ulcer disease: Secondary | ICD-10-CM | POA: Insufficient documentation

## 2015-12-25 DIAGNOSIS — G629 Polyneuropathy, unspecified: Secondary | ICD-10-CM | POA: Insufficient documentation

## 2015-12-25 DIAGNOSIS — Z9221 Personal history of antineoplastic chemotherapy: Secondary | ICD-10-CM | POA: Diagnosis not present

## 2015-12-25 DIAGNOSIS — Z8551 Personal history of malignant neoplasm of bladder: Secondary | ICD-10-CM | POA: Insufficient documentation

## 2015-12-25 DIAGNOSIS — Z85828 Personal history of other malignant neoplasm of skin: Secondary | ICD-10-CM | POA: Insufficient documentation

## 2015-12-25 DIAGNOSIS — R0601 Orthopnea: Secondary | ICD-10-CM | POA: Diagnosis not present

## 2015-12-25 DIAGNOSIS — Z8541 Personal history of malignant neoplasm of cervix uteri: Secondary | ICD-10-CM | POA: Insufficient documentation

## 2015-12-25 DIAGNOSIS — D751 Secondary polycythemia: Secondary | ICD-10-CM | POA: Diagnosis not present

## 2015-12-25 DIAGNOSIS — Z9981 Dependence on supplemental oxygen: Secondary | ICD-10-CM | POA: Insufficient documentation

## 2015-12-25 DIAGNOSIS — M069 Rheumatoid arthritis, unspecified: Secondary | ICD-10-CM | POA: Diagnosis not present

## 2015-12-25 DIAGNOSIS — Z886 Allergy status to analgesic agent status: Secondary | ICD-10-CM | POA: Insufficient documentation

## 2015-12-25 DIAGNOSIS — F17200 Nicotine dependence, unspecified, uncomplicated: Secondary | ICD-10-CM | POA: Diagnosis not present

## 2015-12-25 DIAGNOSIS — F419 Anxiety disorder, unspecified: Secondary | ICD-10-CM | POA: Diagnosis not present

## 2015-12-25 HISTORY — DX: Polyneuropathy, unspecified: G62.9

## 2015-12-25 HISTORY — DX: Orthopnea: R06.01

## 2015-12-25 HISTORY — DX: Major depressive disorder, single episode, unspecified: F32.9

## 2015-12-25 HISTORY — DX: Depression, unspecified: F32.A

## 2015-12-25 HISTORY — DX: Personal history of other diseases of the digestive system: Z87.19

## 2015-12-25 HISTORY — PX: CATARACT EXTRACTION W/PHACO: SHX586

## 2015-12-25 HISTORY — DX: Pain, unspecified: R52

## 2015-12-25 SURGERY — PHACOEMULSIFICATION, CATARACT, WITH IOL INSERTION
Anesthesia: Monitor Anesthesia Care | Site: Eye | Laterality: Right | Wound class: Clean

## 2015-12-25 MED ORDER — PHENYLEPHRINE HCL 10 % OP SOLN
1.0000 [drp] | Freq: Once | OPHTHALMIC | Status: AC
  Administered 2015-12-25: 1 [drp] via OPHTHALMIC

## 2015-12-25 MED ORDER — MIDAZOLAM HCL 2 MG/2ML IJ SOLN
INTRAMUSCULAR | Status: DC | PRN
  Administered 2015-12-25: 1 mg via INTRAVENOUS

## 2015-12-25 MED ORDER — MOXIFLOXACIN HCL 0.5 % OP SOLN
1.0000 [drp] | OPHTHALMIC | Status: AC
  Administered 2015-12-25: 1 [drp] via OPHTHALMIC

## 2015-12-25 MED ORDER — POVIDONE-IODINE 5 % OP SOLN
OPHTHALMIC | Status: AC
  Filled 2015-12-25: qty 30

## 2015-12-25 MED ORDER — EPINEPHRINE HCL 1 MG/ML IJ SOLN
INTRAMUSCULAR | Status: AC
  Filled 2015-12-25: qty 1

## 2015-12-25 MED ORDER — PHENYLEPHRINE HCL 10 % OP SOLN: 1.0000 [drp] | OPHTHALMIC | Status: AC

## 2015-12-25 MED ORDER — LIDOCAINE HCL (PF) 4 % IJ SOLN
INTRAMUSCULAR | Status: DC | PRN
  Administered 2015-12-25: 4 mL via OPHTHALMIC

## 2015-12-25 MED ORDER — POVIDONE-IODINE 5 % OP SOLN
OPHTHALMIC | Status: DC | PRN
  Administered 2015-12-25: 1 via OPHTHALMIC

## 2015-12-25 MED ORDER — CYCLOPENTOLATE HCL 2 % OP SOLN
1.0000 [drp] | Freq: Once | OPHTHALMIC | Status: AC
  Administered 2015-12-25: 1 [drp] via OPHTHALMIC

## 2015-12-25 MED ORDER — TETRACAINE HCL 0.5 % OP SOLN
OPHTHALMIC | Status: DC | PRN
  Administered 2015-12-25: 1 [drp] via OPHTHALMIC

## 2015-12-25 MED ORDER — CYCLOPENTOLATE HCL 2 % OP SOLN
OPHTHALMIC | Status: AC
  Administered 2015-12-25: 1 [drp] via OPHTHALMIC
  Filled 2015-12-25: qty 2

## 2015-12-25 MED ORDER — MOXIFLOXACIN HCL 0.5 % OP SOLN
1.0000 [drp] | Freq: Once | OPHTHALMIC | Status: AC
  Administered 2015-12-25: 1 [drp] via OPHTHALMIC

## 2015-12-25 MED ORDER — LIDOCAINE HCL (PF) 4 % IJ SOLN
INTRAMUSCULAR | Status: AC
  Filled 2015-12-25: qty 5

## 2015-12-25 MED ORDER — HYALURONIDASE HUMAN 150 UNIT/ML IJ SOLN
INTRAMUSCULAR | Status: AC
  Filled 2015-12-25: qty 1

## 2015-12-25 MED ORDER — LIDOCAINE HCL (PF) 4 % IJ SOLN
INTRAMUSCULAR | Status: DC | PRN
  Administered 2015-12-25: .5 mL via OPHTHALMIC

## 2015-12-25 MED ORDER — PHENYLEPHRINE HCL 10 % OP SOLN
OPHTHALMIC | Status: AC
  Administered 2015-12-25: 1 [drp] via OPHTHALMIC
  Filled 2015-12-25: qty 5

## 2015-12-25 MED ORDER — EPINEPHRINE HCL 1 MG/ML IJ SOLN
INTRAMUSCULAR | Status: DC | PRN
  Administered 2015-12-25: 1 mL via OPHTHALMIC

## 2015-12-25 MED ORDER — ACETYLCHOLINE CHLORIDE 20 MG IO SOLR
INTRAOCULAR | Status: AC
  Filled 2015-12-25: qty 1

## 2015-12-25 MED ORDER — ACETYLCHOLINE CHLORIDE 20 MG IO SOLR
INTRAOCULAR | Status: DC | PRN
  Administered 2015-12-25: .5 mg via INTRAOCULAR

## 2015-12-25 MED ORDER — NA CHONDROIT SULF-NA HYALURON 40-17 MG/ML IO SOLN
INTRAOCULAR | Status: AC
  Filled 2015-12-25: qty 1

## 2015-12-25 MED ORDER — NA CHONDROIT SULF-NA HYALURON 40-17 MG/ML IO SOLN
INTRAOCULAR | Status: DC | PRN
  Administered 2015-12-25: 1 mL via INTRAOCULAR

## 2015-12-25 MED ORDER — CEFUROXIME OPHTHALMIC INJECTION 1 MG/0.1 ML
INJECTION | OPHTHALMIC | Status: AC
  Filled 2015-12-25: qty 0.1

## 2015-12-25 MED ORDER — TETRACAINE HCL 0.5 % OP SOLN
OPHTHALMIC | Status: AC
  Filled 2015-12-25: qty 2

## 2015-12-25 MED ORDER — BUPIVACAINE HCL (PF) 0.75 % IJ SOLN
INTRAMUSCULAR | Status: AC
  Filled 2015-12-25: qty 10

## 2015-12-25 MED ORDER — SODIUM CHLORIDE 0.9 % IV SOLN
INTRAVENOUS | Status: DC
  Administered 2015-12-25: 08:00:00 via INTRAVENOUS

## 2015-12-25 MED ORDER — CYCLOPENTOLATE HCL 2 % OP SOLN: 1.0000 [drp] | OPHTHALMIC | Status: AC

## 2015-12-25 MED ORDER — ALFENTANIL 500 MCG/ML IJ INJ
INJECTION | INTRAMUSCULAR | Status: DC | PRN
  Administered 2015-12-25: 500 ug via INTRAVENOUS

## 2015-12-25 MED ORDER — MOXIFLOXACIN HCL 0.5 % OP SOLN
OPHTHALMIC | Status: AC
  Administered 2015-12-25: 1 [drp] via OPHTHALMIC
  Filled 2015-12-25: qty 3

## 2015-12-25 SURGICAL SUPPLY — 31 items
CANNULA ANT/CHMB 27G (MISCELLANEOUS) ×1 IMPLANT
CANNULA ANT/CHMB 27GA (MISCELLANEOUS) ×3 IMPLANT
CORD BIP STRL DISP 12FT (MISCELLANEOUS) ×3 IMPLANT
CUP MEDICINE 2OZ PLAST GRAD ST (MISCELLANEOUS) ×3 IMPLANT
DRAPE XRAY CASSETTE 23X24 (DRAPES) ×3 IMPLANT
ERASER HMR WETFIELD 18G (MISCELLANEOUS) ×3 IMPLANT
GLOVE BIO SURGEON STRL SZ8 (GLOVE) ×3 IMPLANT
GLOVE SURG LX 6.5 MICRO (GLOVE) ×2
GLOVE SURG LX 8.0 MICRO (GLOVE) ×2
GLOVE SURG LX STRL 6.5 MICRO (GLOVE) ×1 IMPLANT
GLOVE SURG LX STRL 8.0 MICRO (GLOVE) ×1 IMPLANT
GOWN STRL REUS W/ TWL LRG LVL3 (GOWN DISPOSABLE) ×1 IMPLANT
GOWN STRL REUS W/ TWL XL LVL3 (GOWN DISPOSABLE) ×1 IMPLANT
GOWN STRL REUS W/TWL LRG LVL3 (GOWN DISPOSABLE) ×3
GOWN STRL REUS W/TWL XL LVL3 (GOWN DISPOSABLE) ×3
LENS IOL ACRSF IQ ULTRA 17.5 (Intraocular Lens) IMPLANT
LENS IOL ACRYSOF IQ 17.5 (Intraocular Lens) ×3 IMPLANT
PACK CATARACT (MISCELLANEOUS) ×3 IMPLANT
PACK CATARACT DINGLEDEIN LX (MISCELLANEOUS) ×3 IMPLANT
PACK EYE AFTER SURG (MISCELLANEOUS) ×3 IMPLANT
SHLD EYE VISITEC  UNIV (MISCELLANEOUS) ×3 IMPLANT
SOL BSS BAG (MISCELLANEOUS) ×3
SOL PREP PVP 2OZ (MISCELLANEOUS) ×3
SOLUTION BSS BAG (MISCELLANEOUS) ×1 IMPLANT
SOLUTION PREP PVP 2OZ (MISCELLANEOUS) ×1 IMPLANT
SUT SILK 5-0 (SUTURE) ×3 IMPLANT
SYR 3ML LL SCALE MARK (SYRINGE) ×3 IMPLANT
SYR 5ML LL (SYRINGE) ×3 IMPLANT
SYR TB 1ML 27GX1/2 LL (SYRINGE) ×3 IMPLANT
WATER STERILE IRR 1000ML POUR (IV SOLUTION) ×3 IMPLANT
WIPE NON LINTING 3.25X3.25 (MISCELLANEOUS) ×3 IMPLANT

## 2015-12-25 NOTE — Discharge Instructions (Signed)
See hand out.

## 2015-12-25 NOTE — Op Note (Signed)
Date of Surgery: 12/25/2015 Date of Dictation: 12/25/2015 9:47 AM Pre-operative Diagnosis:  Nuclear Sclerotic Cataract right Eye Post-operative Diagnosis: same Procedure performed: Extra-capsular Cataract Extraction (ECCE) with placement of a posterior chamber intraocular lens (IOL) right Eye IOL:  Implant Name Type Inv. Item Serial No. Manufacturer Lot No. LRB No. Used  LENS IOL ACRYSOF IQ 17.5 - [id number] Intraocular Lens LENS IOL ACRYSOF IQ 17.5 [id number] ALCON [id number] Right 1   Anesthesia: 2% Lidocaine and 4% Marcaine in a 50/50 mixture with 10 unites/ml of Hylenex given as a peribulbar Anesthesiologist: Anesthesiologist: Martha Clan, MD CRNA: Jonna Clark, CRNA; Doreen Salvage, CRNA Complications: none Estimated Blood Loss: less than 1 ml  Description of procedure:  The patient was given anesthesia and sedation via intravenous access. The patient was then prepped and draped in the usual fashion. A 25-gauge needle was bent for initiating the capsulorhexis. A 5-0 silk suture was placed through the conjunctiva superior and inferiorly to serve as bridle sutures. Hemostasis was obtained at the superior limbus using an eraser cautery. A partial thickness groove was made at the anterior surgical limbus with a 64 Beaver blade and this was dissected anteriorly with an [employer]aya. The anterior chamber was entered at 10 o'clock with a 1.0 mm paracentesis knife and through the lamellar dissection with a 2.6 mm Alcon keratome. Epi-Shugarcaine 0.5 CC [9 cc BSS Plus (Alcon), 3 cc 4% preservative-free lidocaine (Hospira) and 4 cc 1:1000 preservative-free, bisulfite-free epinephrine] was injected into the anterior chamber via the paracentesis tract. Epi-Shugarcaine 0.5 CC [9 cc BSS Plus (Alcon), 3 cc 4% preservative-free lidocaine (Hospira) and 4 cc 1:1000 preservative-free, bisulfite-free epinephrine] was injected into the anterior chamber via the paracentesis tract. DiscoVisc was injected  to replace the aqueous and a continuous tear curvilinear capsulorhexis was performed using a bent 25-gauge needle.  Balance salt on a syringe was used to perform hydro-dissection and phacoemulsification was carried out using a divide and conquer technique. Procedure(s) with comments: CATARACT EXTRACTION PHACO AND INTRAOCULAR LENS PLACEMENT (IOC) (Right) - [place] 01:32  AP% 26.0 CDE 40.86 fluid pack lot # [id number]. Irrigation/aspiration was used to remove the residual cortex and the capsular bag was inflated with DiscoVisc. The intraocular lens was inserted into the capsular bag using a pre-loaded UltraSert Delivery System. Irrigation/aspiration was used to remove the residual DiscoVisc. The wound was inflated with balanced salt and checked for leaks. None were found. Miostat was injected via the paracentesis track and 0.1 ml of Vigamox containing 1 mg of drug  was injected via the paracentesis track. The wound was checked for leaks again and none were found.   The bridal sutures were removed and two drops of Vigamox were placed on the eye. An eye shield was placed to protect the eye and the patient was discharged to the recovery area in good condition.   Marguerette Sheller MD

## 2015-12-25 NOTE — Interval H&P Note (Signed)
History and Physical Interval Note:  12/25/2015 9:01 AM  Rebecca Holland  has presented today for surgery, with the diagnosis of cataract  The various methods of treatment have been discussed with the patient and family. After consideration of risks, benefits and other options for treatment, the patient has consented to  Procedure(s): CATARACT EXTRACTION PHACO AND INTRAOCULAR LENS PLACEMENT ([name]) (Right) as a surgical intervention .  The patient's history has been reviewed, patient examined, no change in status, stable for surgery.  I have reviewed the patient's chart and labs.  Questions were answered to the patient's satisfaction.     Hara Milholland
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
## 2015-12-25 NOTE — Anesthesia Postprocedure Evaluation (Signed)
Anesthesia Post Note  Patient: Rebecca Holland  Procedure(s) Performed: Procedure(s) (LRB): CATARACT EXTRACTION PHACO AND INTRAOCULAR LENS PLACEMENT (IOC) (Right)  Patient location during evaluation: Short Stay Anesthesia Type: MAC Level of consciousness: awake and alert and oriented Pain management: satisfactory to patient Vital Signs Assessment: post-procedure vital signs reviewed and stable Respiratory status: spontaneous breathing Cardiovascular status: stable Anesthetic complications: no    Last Vitals:  Filed Vitals:   12/25/15 0949 12/25/15 0950  BP: 130/60 130/60  Pulse: 86 86  Temp: 36.4 C   Resp: 18 20    Last Pain:  Filed Vitals:   12/25/15 0953  PainSc: 0-No pain                 Lanora Manis

## 2015-12-25 NOTE — Transfer of Care (Signed)
Immediate Anesthesia Transfer of Care Note  Patient: Rebecca Holland  Procedure(s) Performed: Procedure(s) with comments: CATARACT EXTRACTION PHACO AND INTRAOCULAR LENS PLACEMENT (IOC) (Right) - [place] 01:32  AP% 26.0 CDE 40.86 fluid pack lot # [id number]  Patient Location: PACU and Short Stay  Anesthesia Type:MAC  Level of Consciousness: awake, alert  and oriented  Airway & Oxygen Therapy: Patient Spontanous Breathing and Patient connected to nasal cannula oxygen  Post-op Assessment: Report given to RN and Post -op Vital signs reviewed and stable  Post vital signs: Reviewed and stable  Last Vitals:  Filed Vitals:   12/25/15 0808 12/25/15 0950  BP: 129/58 130/60  Pulse: 84 86  Temp: 37.1 C   Resp: 18 20    Last Pain:  Filed Vitals:   12/25/15 0951  PainSc: 0-No pain         Complications: No apparent anesthesia complications

## 2016-01-04 ENCOUNTER — Inpatient Hospital Stay: Payer: Medicare Other | Attending: Hematology and Oncology

## 2016-01-04 DIAGNOSIS — C679 Malignant neoplasm of bladder, unspecified: Secondary | ICD-10-CM | POA: Insufficient documentation

## 2016-01-04 DIAGNOSIS — Z923 Personal history of irradiation: Secondary | ICD-10-CM | POA: Diagnosis not present

## 2016-01-04 DIAGNOSIS — C7919 Secondary malignant neoplasm of other urinary organs: Secondary | ICD-10-CM | POA: Diagnosis not present

## 2016-01-04 DIAGNOSIS — C801 Malignant (primary) neoplasm, unspecified: Secondary | ICD-10-CM

## 2016-01-04 MED ORDER — HEPARIN SOD (PORK) LOCK FLUSH 100 UNIT/ML IV SOLN
500.0000 [IU] | Freq: Once | INTRAVENOUS | Status: AC
  Administered 2016-01-04: 500 [IU] via INTRAVENOUS
  Filled 2016-01-04: qty 5

## 2016-01-04 MED ORDER — SODIUM CHLORIDE 0.9% FLUSH
10.0000 mL | INTRAVENOUS | Status: DC | PRN
  Administered 2016-01-04: 10 mL via INTRAVENOUS
  Filled 2016-01-04: qty 10

## 2016-01-05 ENCOUNTER — Encounter: Payer: Self-pay | Admitting: Pulmonary Disease

## 2016-01-05 ENCOUNTER — Ambulatory Visit (INDEPENDENT_AMBULATORY_CARE_PROVIDER_SITE_OTHER): Payer: Medicare Other | Admitting: Pulmonary Disease

## 2016-01-05 VITALS — BP 136/68 | HR 80 | Ht 67.5 in | Wt 148.0 lb

## 2016-01-05 DIAGNOSIS — J45909 Unspecified asthma, uncomplicated: Secondary | ICD-10-CM

## 2016-01-05 DIAGNOSIS — J449 Chronic obstructive pulmonary disease, unspecified: Secondary | ICD-10-CM

## 2016-01-05 DIAGNOSIS — Z720 Tobacco use: Secondary | ICD-10-CM

## 2016-01-05 DIAGNOSIS — R0902 Hypoxemia: Secondary | ICD-10-CM | POA: Diagnosis not present

## 2016-01-05 DIAGNOSIS — F17200 Nicotine dependence, unspecified, uncomplicated: Secondary | ICD-10-CM

## 2016-01-08 NOTE — Progress Notes (Signed)
PROBLEMS: Severe COPD  INTERVAL HISTORY: No major events  SUBJ: This is a routine return office visit. She has no new complaints. Still with class III dyspnea but it is difficult to tell if she is more limited by dyspnea or musculoskeletal pain. She reports NP cough. Denies CP, fever, purulent sputum, hemoptysis, LE edema and calf tenderness. She continues to smoke 5 "half cigarettes" per day - upon awakening, after meals and before bed. She is using rescue inhaler 1-2 times per day on average.   OBJ: Filed Vitals:   01/05/16 1031  BP: 136/68  Pulse: 80  Height: 5' 7.5" (1.715 m)  Weight: 148 lb (67.132 kg)  SpO2: 91%   Frail appearing, raspy cough HEENT WNL JVP not well visualized Distant BS, scattered coarse wheezes Reg, no M NABS, soft No LE edema  Current Outpatient Prescriptions on File Prior to Visit  Medication Sig Dispense Refill  . albuterol (PROVENTIL HFA;VENTOLIN HFA) 108 (90 BASE) MCG/ACT inhaler Inhale 2 puffs into the lungs every 6 (six) hours as needed for wheezing or shortness of breath.    Marland Kitchen arformoterol (BROVANA) 15 MCG/2ML NEBU Take 2 mLs (15 mcg total) by nebulization 2 (two) times daily. 120 mL 12  . budesonide (PULMICORT) 0.5 MG/2ML nebulizer solution Take 2 mLs (0.5 mg total) by nebulization 2 (two) times daily. 120 mL 12  . Calcium-Magnesium-Vitamin D (CALCIUM 1200+D3 PO) Take 1 tablet by mouth daily.    . Cholecalciferol (VITAMIN D3) 400 units CAPS Take 800 Units by mouth daily.    . cyclobenzaprine (FLEXERIL) 10 MG tablet Take 10 mg by mouth 2 (two) times daily.     . diazepam (VALIUM) 5 MG tablet Take 10 mg by mouth at bedtime.     . Diphenhyd-Hydrocort-Nystatin (FIRST-DUKES MOUTHWASH) SUSP Use as directed 5 mLs in the mouth or throat 4 (four) times daily as needed (for thrush). Reported on 11/14/2015    . diphenoxylate-atropine (LOMOTIL) 2.5-0.025 MG tablet Take 1 tablet by mouth 4 (four) times daily as needed for diarrhea or loose stools. 30 tablet 0  .  docusate sodium (COLACE) 50 MG capsule Take 100 mg by mouth 2 (two) times daily.     . DULoxetine (CYMBALTA) 20 MG capsule Take 20 mg by mouth at bedtime.     . hyoscyamine (LEVSIN, ANASPAZ) 0.125 MG tablet Take 1 tablet (0.125 mg total) by mouth every 4 (four) hours as needed. (Patient taking differently: Take 0.125 mg by mouth every 4 (four) hours as needed for cramping. ) 30 tablet 1  . ipratropium-albuterol (DUONEB) 0.5-2.5 (3) MG/3ML SOLN Inhale 3 mLs into the lungs every 4 (four) hours as needed (for wheezing/shortness of breath). Reported on 11/14/2015    . lidocaine-prilocaine (EMLA) cream Apply 1 application topically as needed (prior to accessing port).     . loratadine (CLARITIN) 10 MG tablet Take 10 mg by mouth daily.    . magnesium oxide (MAG-OX) 400 (241.3 MG) MG tablet Take 1 tablet (400 mg total) by mouth daily. 30 tablet 0  . Meth-Hyo-M Bl-Na Phos-Ph Sal (URIBEL) 118 MG CAPS Take 118 mg by mouth every 6 (six) hours as needed (for bladder spasms).    . Misc Natural Products (COLON CARE PO) Take 1 capsule by mouth daily.    Marland Kitchen oxycodone (ROXICODONE) 30 MG immediate release tablet Take 30 mg by mouth every 4 (four) hours as needed for pain.     . polyethylene glycol (MIRALAX / GLYCOLAX) packet Take 17 g by mouth daily.    Marland Kitchen  psyllium (HYDROCIL/METAMUCIL) 95 % PACK Take 1 packet by mouth daily.    . ranitidine (ZANTAC) 150 MG tablet Take 300 mg by mouth at bedtime.     Marland Kitchen tiotropium (SPIRIVA) 18 MCG inhalation capsule Place 18 mcg into inhaler and inhale daily.    . varenicline (CHANTIX) 1 MG tablet Take 1 tablet (1 mg total) by mouth 2 (two) times daily. 60 tablet 3  . vitamin B-12 1000 MCG tablet Take 1 tablet (1,000 mcg total) by mouth daily. 30 tablet 0     DATA: Recent CXRs reviewed   IMPRESSION: COPD - we have no PFTs but clinically judged to be severe Chronic asthmatic bronchitis Recalcitrant smoker - continues to have poor insight into role of smoking in her poor  health  PLAN: Continue Spiriva Begin Budesonide 0.5 mg nebulized BID Begin arformetorol 15 mcg nebulized BID Continue Duoneb PRN Counseled again @ length re: need for total smoking cessation. Suggested dropping cigarette @ bedtime first and tapering from there ROV 8-10 weeks   Merton Border, MD PCCM service Mobile [phone] Pager [phone] 01/08/2016

## 2016-01-26 ENCOUNTER — Other Ambulatory Visit: Payer: Self-pay | Admitting: Hematology and Oncology

## 2016-01-26 ENCOUNTER — Encounter: Payer: Self-pay | Admitting: Hematology and Oncology

## 2016-01-26 ENCOUNTER — Inpatient Hospital Stay (HOSPITAL_BASED_OUTPATIENT_CLINIC_OR_DEPARTMENT_OTHER): Payer: Medicare Other | Admitting: Hematology and Oncology

## 2016-01-26 ENCOUNTER — Inpatient Hospital Stay: Payer: Medicare Other | Attending: Hematology and Oncology

## 2016-01-26 VITALS — BP 109/67 | HR 80 | Temp 96.7°F | Resp 18 | Ht 67.5 in | Wt 148.6 lb

## 2016-01-26 DIAGNOSIS — J449 Chronic obstructive pulmonary disease, unspecified: Secondary | ICD-10-CM

## 2016-01-26 DIAGNOSIS — Z9221 Personal history of antineoplastic chemotherapy: Secondary | ICD-10-CM | POA: Insufficient documentation

## 2016-01-26 DIAGNOSIS — F17210 Nicotine dependence, cigarettes, uncomplicated: Secondary | ICD-10-CM

## 2016-01-26 DIAGNOSIS — J45909 Unspecified asthma, uncomplicated: Secondary | ICD-10-CM

## 2016-01-26 DIAGNOSIS — D751 Secondary polycythemia: Secondary | ICD-10-CM

## 2016-01-26 DIAGNOSIS — G629 Polyneuropathy, unspecified: Secondary | ICD-10-CM | POA: Diagnosis not present

## 2016-01-26 DIAGNOSIS — D72829 Elevated white blood cell count, unspecified: Secondary | ICD-10-CM | POA: Insufficient documentation

## 2016-01-26 DIAGNOSIS — D649 Anemia, unspecified: Secondary | ICD-10-CM | POA: Diagnosis not present

## 2016-01-26 DIAGNOSIS — Z87828 Personal history of other (healed) physical injury and trauma: Secondary | ICD-10-CM | POA: Insufficient documentation

## 2016-01-26 DIAGNOSIS — R918 Other nonspecific abnormal finding of lung field: Secondary | ICD-10-CM | POA: Diagnosis not present

## 2016-01-26 DIAGNOSIS — F329 Major depressive disorder, single episode, unspecified: Secondary | ICD-10-CM

## 2016-01-26 DIAGNOSIS — I2510 Atherosclerotic heart disease of native coronary artery without angina pectoris: Secondary | ICD-10-CM

## 2016-01-26 DIAGNOSIS — R0602 Shortness of breath: Secondary | ICD-10-CM

## 2016-01-26 DIAGNOSIS — Z923 Personal history of irradiation: Secondary | ICD-10-CM

## 2016-01-26 DIAGNOSIS — G8929 Other chronic pain: Secondary | ICD-10-CM | POA: Diagnosis not present

## 2016-01-26 DIAGNOSIS — N189 Chronic kidney disease, unspecified: Secondary | ICD-10-CM

## 2016-01-26 DIAGNOSIS — I714 Abdominal aortic aneurysm, without rupture: Secondary | ICD-10-CM

## 2016-01-26 DIAGNOSIS — C679 Malignant neoplasm of bladder, unspecified: Secondary | ICD-10-CM | POA: Insufficient documentation

## 2016-01-26 DIAGNOSIS — D759 Disease of blood and blood-forming organs, unspecified: Secondary | ICD-10-CM

## 2016-01-26 DIAGNOSIS — K219 Gastro-esophageal reflux disease without esophagitis: Secondary | ICD-10-CM | POA: Diagnosis not present

## 2016-01-26 DIAGNOSIS — R42 Dizziness and giddiness: Secondary | ICD-10-CM | POA: Diagnosis not present

## 2016-01-26 DIAGNOSIS — K449 Diaphragmatic hernia without obstruction or gangrene: Secondary | ICD-10-CM | POA: Diagnosis not present

## 2016-01-26 DIAGNOSIS — F419 Anxiety disorder, unspecified: Secondary | ICD-10-CM | POA: Diagnosis not present

## 2016-01-26 DIAGNOSIS — Z803 Family history of malignant neoplasm of breast: Secondary | ICD-10-CM

## 2016-01-26 DIAGNOSIS — R300 Dysuria: Secondary | ICD-10-CM | POA: Insufficient documentation

## 2016-01-26 DIAGNOSIS — Z79899 Other long term (current) drug therapy: Secondary | ICD-10-CM | POA: Insufficient documentation

## 2016-01-26 DIAGNOSIS — Z8701 Personal history of pneumonia (recurrent): Secondary | ICD-10-CM

## 2016-01-26 DIAGNOSIS — Z801 Family history of malignant neoplasm of trachea, bronchus and lung: Secondary | ICD-10-CM | POA: Insufficient documentation

## 2016-01-26 DIAGNOSIS — Z800 Family history of malignant neoplasm of digestive organs: Secondary | ICD-10-CM | POA: Insufficient documentation

## 2016-01-26 DIAGNOSIS — I129 Hypertensive chronic kidney disease with stage 1 through stage 4 chronic kidney disease, or unspecified chronic kidney disease: Secondary | ICD-10-CM | POA: Insufficient documentation

## 2016-01-26 DIAGNOSIS — M797 Fibromyalgia: Secondary | ICD-10-CM

## 2016-01-26 DIAGNOSIS — Z9981 Dependence on supplemental oxygen: Secondary | ICD-10-CM | POA: Insufficient documentation

## 2016-01-26 LAB — COMPREHENSIVE METABOLIC PANEL
ALT: 10 U/L — ABNORMAL LOW (ref 14–54)
AST: 15 U/L (ref 15–41)
Albumin: 3.7 g/dL (ref 3.5–5.0)
Alkaline Phosphatase: 67 U/L (ref 38–126)
Anion gap: 10 (ref 5–15)
BUN: 24 mg/dL — ABNORMAL HIGH (ref 6–20)
CO2: 31 mmol/L (ref 22–32)
Calcium: 9.5 mg/dL (ref 8.9–10.3)
Chloride: 96 mmol/L — ABNORMAL LOW (ref 101–111)
Creatinine, Ser: 0.87 mg/dL (ref 0.44–1.00)
GFR calc Af Amer: >60 mL/min (ref >60)
GFR calc non Af Amer: >60 mL/min (ref >60)
Glucose, Bld: 94 mg/dL (ref 65–99)
Potassium: 3.9 mmol/L (ref 3.5–5.1)
Sodium: 137 mmol/L (ref 135–145)
Total Bilirubin: 0.4 mg/dL (ref 0.3–1.2)
Total Protein: 6.9 g/dL (ref 6.5–8.1)

## 2016-01-26 LAB — URINALYSIS COMPLETE WITH MICROSCOPIC (ARMC ONLY)
Bacteria, UA: NONE SEEN
Bilirubin Urine: NEGATIVE
Glucose, UA: NEGATIVE mg/dL
Hgb urine dipstick: NEGATIVE
Ketones, ur: NEGATIVE mg/dL
Nitrite: NEGATIVE
Protein, ur: 100 mg/dL — AB
RBC / HPF: NONE SEEN RBC/hpf (ref 0–5)
Specific Gravity, Urine: 1.023 (ref 1.005–1.030)
Squamous Epithelial / LPF: NONE SEEN
WBC, UA: NONE SEEN WBC/hpf (ref 0–5)
pH: 6 (ref 5.0–8.0)

## 2016-01-26 LAB — CBC WITH DIFFERENTIAL/PLATELET
Basophils Absolute: 0 10*3/uL (ref 0–0.1)
Basophils Relative: 0 %
Eosinophils Absolute: 0.1 10*3/uL (ref 0–0.7)
Eosinophils Relative: 1 %
HCT: 44.6 % (ref 35.0–47.0)
Hemoglobin: 15.3 g/dL (ref 12.0–16.0)
Lymphocytes Relative: 13 %
Lymphs Abs: 1.1 10*3/uL (ref 1.0–3.6)
MCH: 31.9 pg (ref 26.0–34.0)
MCHC: 34.3 g/dL (ref 32.0–36.0)
MCV: 93.1 fL (ref 80.0–100.0)
Monocytes Absolute: 0.8 10*3/uL (ref 0.2–0.9)
Monocytes Relative: 10 %
Neutro Abs: 6.3 10*3/uL (ref 1.4–6.5)
Neutrophils Relative %: 76 %
Platelets: 242 10*3/uL (ref 150–440)
RBC: 4.8 MIL/uL (ref 3.80–5.20)
RDW: 15.1 % — ABNORMAL HIGH (ref 11.5–14.5)
WBC: 8.3 10*3/uL (ref 3.6–11.0)

## 2016-01-26 LAB — TSH: TSH: 3.364 u[IU]/mL (ref 0.350–4.500)

## 2016-01-26 LAB — VITAMIN B12: Vitamin B-12: 1548 pg/mL — ABNORMAL HIGH (ref 180–914)

## 2016-01-26 LAB — FOLATE: Folate: 17.1 ng/mL (ref >5.9)
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
## 2016-01-26 NOTE — Progress Notes (Signed)
Pt reports moderate fatigue.  SOB on  Exertions on 2L o2 via .  Cystoscopy in May.  Complains that there is pain upon beginning urination, dark color urine orange, darker than usual.  Has constipation managed well with medications

## 2016-01-27 LAB — URINE CULTURE: Culture: NO GROWTH

## 2016-02-16 ENCOUNTER — Inpatient Hospital Stay: Payer: Medicare Other | Attending: Hematology and Oncology

## 2016-02-22 ENCOUNTER — Telehealth: Payer: Self-pay | Admitting: Pulmonary Disease

## 2016-02-22 NOTE — Telephone Encounter (Signed)
Patient wants to know if she needs to start another round of abx.  Please call .

## 2016-02-22 NOTE — Telephone Encounter (Signed)
Spoke with pt and she states that she saw PCP 02/12/16 and they did cxr. Pt was found to have PNA and started Levaquin 500mg , qd x10 days. She has run out of tablets. When asked if she may have taken too many she admits that she may have. She is concerned that she still has productive cough with green/brown mucus, some wheeze and SOB. Pt denies f/n/v. Pt would like to know what else she should do or if she needs more medication.  MW Please advise as CY has left for day. Thanks!   LOV 01/05/16 w/DS  IMPRESSION: COPD - we have no PFTs but clinically judged to be severe Chronic asthmatic bronchitis Recalcitrant smoker - continues to have poor insight into role of smoking in her poor health  PLAN: Continue Spiriva Begin Budesonide 0.5 mg nebulized BID Begin arformetorol 15 mcg nebulized BID Continue Duoneb PRN Counseled again @ length re: need for total smoking cessation. Suggested dropping cigarette @ bedtime first and tapering from there ROV 8-10 weeks   Merton Border, MD PCCM service Mobile [phone] Pager [phone] 01/08/2016

## 2016-02-22 NOTE — Telephone Encounter (Signed)
No further abx, needs ov with NP's to sort out if not better by first of next week

## 2016-02-22 NOTE — Telephone Encounter (Signed)
Spoke with pt and gave below recommendations. She will contact LBP or PCP next week if not improved. Nothing further needed.

## 2016-02-23 ENCOUNTER — Ambulatory Visit
Admission: RE | Admit: 2016-02-23 | Discharge: 2016-02-23 | Disposition: A | Discharge status: Home / Self Care | Payer: Medicare Other | Source: Ambulatory Visit | Attending: Radiation Oncology | Admitting: Radiation Oncology

## 2016-02-23 ENCOUNTER — Encounter: Payer: Self-pay | Admitting: Radiation Oncology

## 2016-02-23 VITALS — BP 96/59 | HR 88 | Temp 97.5°F | Resp 20 | Wt 149.4 lb

## 2016-02-23 DIAGNOSIS — C679 Malignant neoplasm of bladder, unspecified: Secondary | ICD-10-CM

## 2016-02-23 NOTE — Progress Notes (Signed)
Radiation Oncology Follow up Note  Name: Rebecca Holland   Date:   02/23/2016 MRN:  [id number] DOB: 1951-07-26    This 65 y.o. female presents to the clinic today for follow-up for stage II invasive high-grade bladder cancer status post radiation therapy.  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 She is seen today in routine follow-up and is doing fairly well. She's had multiple hospitalizations for hypoxic events and metabolic encephalopathy. Recently had a cystoscopy showing no evidence of disease. She also had back in March a PET CT scan showing no evidence of hypermetabolic activity. She seen today in routine follow-up and is otherwise doing well.. She scheduled for another cystoscopy in approximately 3 months.  COMPLICATIONS OF TREATMENT: none  FOLLOW UP COMPLIANCE: keeps appointments   PHYSICAL EXAM:  BP 96/59 mmHg  Pulse 88  Temp(Src) 97.5 F (36.4 C)  Resp 20  Wt 149 lb 5.8 oz (67.75 kg) Well-developed O2 dependent female in NAD. No superpubic tenderness is noted. Well-developed well-nourished patient in NAD. HEENT reveals PERLA, EOMI, discs not visualized.  Oral cavity is clear. No oral mucosal lesions are identified. Neck is clear without evidence of cervical or supraclavicular adenopathy. Lungs are clear to A&P. Cardiac examination is essentially unremarkable with regular rate and rhythm without murmur rub or thrill. Abdomen is benign with no organomegaly or masses noted. Motor sensory and DTR levels are equal and symmetric in the upper and lower extremities. Cranial nerves II through XII are grossly intact. Proprioception is intact. No peripheral adenopathy or edema is identified. No motor or sensory levels are noted. Crude visual fields are within normal range.  RADIOLOGY RESULTS: PET CT scan is reviewed  compatible with the above-stated findings  PLAN: Present time she is doing well with no evidence of disease. I am please were overall progress. I've asked to see her back in 1 year for follow-up. She continues close follow-up care with urology and medical oncology. She knows to call our office with any concerns.  I would like to take this opportunity to thank you for allowing me to participate in the care of your patient.Armstead Peaks., MD

## 2016-02-26 ENCOUNTER — Telehealth: Payer: Self-pay | Admitting: Pulmonary Disease

## 2016-02-26 NOTE — Telephone Encounter (Signed)
Called and spoke with Levada Dy at [facility] and she stated that this was the first that she had heard of patient having any issues with her BiPap.  Angela with [facility] stated that she would give them a call and try to see what type of issues she is having. Levada Dy to check to see which phone number the patient/family has been calling, since she has not heard of any issues. Rhonda J Cobb

## 2016-02-26 NOTE — Telephone Encounter (Signed)
Called and spoke with husband and he states that pt is telling him that her BPAP machine is making a noise. States they called [facility] last week and no one has contacted them back. Would like to switch to [facility] if possible since they have O2 with them. Please advise.

## 2016-02-26 NOTE — Telephone Encounter (Signed)
Called and spoke with [facility] and Levada Dy stated that she [facility] for pt to return her call last week, that she was checking on patient and was wanting to know how she was doing. Pt never called back, however, husband stated that the message didn't ask them to call her back.  Levada Dy with [facility] contacted patient and arranged to go to patients home tomorrow 02/27/16. Contacted pt's husband back and he stated that [facility] had called and scheduled to come out to check machine 02/27/16. Advised patient that if he had any additional issues to please contact our office back. Rhonda J Cobb

## 2016-02-26 NOTE — Telephone Encounter (Signed)
Pt spouse calling wanting to talk to someone about pt. Pt  is on breathing machine that is through [facility] Would like to see if we can change companies they are not getting good services.  They have had some issues would like to know if we can call them and get things straightened out or if we need to change companies all together.  Please advise.

## 2016-03-06 ENCOUNTER — Encounter: Payer: Self-pay | Admitting: Pulmonary Disease

## 2016-03-06 ENCOUNTER — Ambulatory Visit (INDEPENDENT_AMBULATORY_CARE_PROVIDER_SITE_OTHER): Payer: Medicare Other | Admitting: Pulmonary Disease

## 2016-03-06 VITALS — BP 132/70 | HR 85 | Ht 67.5 in | Wt 150.2 lb

## 2016-03-06 DIAGNOSIS — Z720 Tobacco use: Secondary | ICD-10-CM

## 2016-03-06 DIAGNOSIS — F17200 Nicotine dependence, unspecified, uncomplicated: Secondary | ICD-10-CM

## 2016-03-06 DIAGNOSIS — J449 Chronic obstructive pulmonary disease, unspecified: Secondary | ICD-10-CM | POA: Diagnosis not present

## 2016-03-06 DIAGNOSIS — R938 Abnormal findings on diagnostic imaging of other specified body structures: Secondary | ICD-10-CM

## 2016-03-06 DIAGNOSIS — R9389 Abnormal findings on diagnostic imaging of other specified body structures: Secondary | ICD-10-CM

## 2016-03-07 NOTE — Progress Notes (Signed)
PROBLEMS: Severe COPD  INTERVAL HISTORY: Has been treated with multiple courses of abx by Dr Doy Hutching for "PNA".   SUBJ: Completing 3rd course of abx prescribed by Dr Doy Hutching for PNA. Reportedly had abnormalities on CXR - I am unable to access these films. She has had purulent sputum which is now clearing. She is smoking 1/2 to 1 PPD but insists that she does not smoke cigarettes fully. She indicates that she has been under a lot of stress. She has Chantix but has not started it. Her quit date is 04/02/16 (for personal reasons). Last visit, I started nebulized budesonide/arformoterol. She is not consistently using these meds twice a day. She remains on Spiriva. Denies CP, fever, purulent sputum, hemoptysis, LE edema and calf tenderness   OBJ: Vitals:   03/06/16 1210  BP: 132/70  Pulse: 85  SpO2: 91%  Weight: 150 lb 3.2 oz (68.1 kg)  Height: 5' 7.5" (1.715 m)   NAD HEENT WNL JVP not well visualized Distant BS, scattered coarse wheezes Reg, no M NABS, soft No LE edema  Current Outpatient Prescriptions on File Prior to Visit  Medication Sig Dispense Refill  . albuterol (PROVENTIL HFA;VENTOLIN HFA) 108 (90 BASE) MCG/ACT inhaler Inhale 2 puffs into the lungs every 6 (six) hours as needed for wheezing or shortness of breath.    Marland Kitchen arformoterol (BROVANA) 15 MCG/2ML NEBU Take 2 mLs (15 mcg total) by nebulization 2 (two) times daily. 120 mL 12  . budesonide (PULMICORT) 0.5 MG/2ML nebulizer solution Take 2 mLs (0.5 mg total) by nebulization 2 (two) times daily. 120 mL 12  . Calcium-Magnesium-Vitamin D (CALCIUM 1200+D3 PO) Take 1 tablet by mouth daily.    . Cholecalciferol (VITAMIN D3) 400 units CAPS Take 800 Units by mouth daily.    . cyclobenzaprine (FLEXERIL) 10 MG tablet Take 10 mg by mouth 2 (two) times daily.     . diazepam (VALIUM) 5 MG tablet Take 10 mg by mouth at bedtime.     . Diphenhyd-Hydrocort-Nystatin (FIRST-DUKES MOUTHWASH) SUSP Use as directed 5 mLs in the mouth or throat 4  (four) times daily as needed (for thrush). Reported on 11/14/2015    . diphenoxylate-atropine (LOMOTIL) 2.5-0.025 MG tablet Take 1 tablet by mouth 4 (four) times daily as needed for diarrhea or loose stools. 30 tablet 0  . docusate sodium (COLACE) 50 MG capsule Take 100 mg by mouth 2 (two) times daily.     . DULoxetine (CYMBALTA) 20 MG capsule Take 20 mg by mouth at bedtime.     . hyoscyamine (LEVSIN, ANASPAZ) 0.125 MG tablet Take 1 tablet (0.125 mg total) by mouth every 4 (four) hours as needed. (Patient taking differently: Take 0.125 mg by mouth every 4 (four) hours as needed for cramping. ) 30 tablet 1  . ipratropium-albuterol (DUONEB) 0.5-2.5 (3) MG/3ML SOLN Inhale 3 mLs into the lungs every 4 (four) hours as needed (for wheezing/shortness of breath). Reported on 11/14/2015    . lidocaine-prilocaine (EMLA) cream Apply 1 application topically as needed (prior to accessing port).     . loratadine (CLARITIN) 10 MG tablet Take 10 mg by mouth daily.    . magnesium oxide (MAG-OX) 400 (241.3 MG) MG tablet Take 1 tablet (400 mg total) by mouth daily. 30 tablet 0  . Meth-Hyo-M Bl-Na Phos-Ph Sal (URIBEL) 118 MG CAPS Take 118 mg by mouth every 6 (six) hours as needed (for bladder spasms).    . Misc Natural Products (COLON CARE PO) Take 1 capsule by mouth daily.    Marland Kitchen  oxycodone (ROXICODONE) 30 MG immediate release tablet Take 30 mg by mouth every 4 (four) hours as needed for pain.     . polyethylene glycol (MIRALAX / GLYCOLAX) packet Take 17 g by mouth daily.    . psyllium (HYDROCIL/METAMUCIL) 95 % PACK Take 1 packet by mouth daily.    . ranitidine (ZANTAC) 150 MG tablet Take 300 mg by mouth at bedtime.     Marland Kitchen tiotropium (SPIRIVA) 18 MCG inhalation capsule Place 18 mcg into inhaler and inhale daily.    . varenicline (CHANTIX) 1 MG tablet Take 1 tablet (1 mg total) by mouth 2 (two) times daily. 60 tablet 3  . vitamin B-12 1000 MCG tablet Take 1 tablet (1,000 mcg total) by mouth daily. 30 tablet 0      DATA: Recent CXR N/A for my review  IMPRESSION: COPD - we have no PFTs but clinically judged to be severe Chronic asthmatic bronchitis Recalcitrant smoker - continues to have poor insight into role of smoking in her poor health  PLAN: Continue Spiriva Cont Budesonide 0.5 mg arformetorol 15 mcg nebulized BID Continue Duoneb PRN Counseled again @ length re: need for total smoking cessation. ROV 4-6 weeks with CXR   Merton Border, MD PCCM service Mobile [phone] Pager [phone] 03/07/2016

## 2016-03-10 ENCOUNTER — Emergency Department: Payer: Medicare Other

## 2016-03-10 ENCOUNTER — Inpatient Hospital Stay
Admission: EM | Admit: 2016-03-10 | Discharge: 2016-03-10 | DRG: 189 | Disposition: A | Discharge status: Home / Self Care | Payer: Medicare Other | Attending: Specialist | Admitting: Specialist

## 2016-03-10 ENCOUNTER — Inpatient Hospital Stay: Payer: Medicare Other

## 2016-03-10 DIAGNOSIS — G4733 Obstructive sleep apnea (adult) (pediatric): Secondary | ICD-10-CM | POA: Diagnosis present

## 2016-03-10 DIAGNOSIS — R7989 Other specified abnormal findings of blood chemistry: Secondary | ICD-10-CM

## 2016-03-10 DIAGNOSIS — J9622 Acute and chronic respiratory failure with hypercapnia: Secondary | ICD-10-CM | POA: Diagnosis present

## 2016-03-10 DIAGNOSIS — J441 Chronic obstructive pulmonary disease with (acute) exacerbation: Secondary | ICD-10-CM | POA: Diagnosis present

## 2016-03-10 DIAGNOSIS — Z882 Allergy status to sulfonamides status: Secondary | ICD-10-CM

## 2016-03-10 DIAGNOSIS — F17210 Nicotine dependence, cigarettes, uncomplicated: Secondary | ICD-10-CM | POA: Diagnosis present

## 2016-03-10 DIAGNOSIS — Z8551 Personal history of malignant neoplasm of bladder: Secondary | ICD-10-CM

## 2016-03-10 DIAGNOSIS — Z90710 Acquired absence of both cervix and uterus: Secondary | ICD-10-CM | POA: Diagnosis not present

## 2016-03-10 DIAGNOSIS — Z716 Tobacco abuse counseling: Secondary | ICD-10-CM

## 2016-03-10 DIAGNOSIS — N183 Chronic kidney disease, stage 3 (moderate): Secondary | ICD-10-CM | POA: Diagnosis present

## 2016-03-10 DIAGNOSIS — Z803 Family history of malignant neoplasm of breast: Secondary | ICD-10-CM

## 2016-03-10 DIAGNOSIS — Z66 Do not resuscitate: Secondary | ICD-10-CM | POA: Diagnosis present

## 2016-03-10 DIAGNOSIS — Z9981 Dependence on supplemental oxygen: Secondary | ICD-10-CM

## 2016-03-10 DIAGNOSIS — Z9841 Cataract extraction status, right eye: Secondary | ICD-10-CM | POA: Diagnosis not present

## 2016-03-10 DIAGNOSIS — Z801 Family history of malignant neoplasm of trachea, bronchus and lung: Secondary | ICD-10-CM | POA: Diagnosis not present

## 2016-03-10 DIAGNOSIS — K219 Gastro-esophageal reflux disease without esophagitis: Secondary | ICD-10-CM | POA: Diagnosis present

## 2016-03-10 DIAGNOSIS — Z800 Family history of malignant neoplasm of digestive organs: Secondary | ICD-10-CM | POA: Diagnosis not present

## 2016-03-10 DIAGNOSIS — R140 Abdominal distension (gaseous): Secondary | ICD-10-CM

## 2016-03-10 DIAGNOSIS — M797 Fibromyalgia: Secondary | ICD-10-CM | POA: Diagnosis present

## 2016-03-10 DIAGNOSIS — Z8701 Personal history of pneumonia (recurrent): Secondary | ICD-10-CM | POA: Diagnosis not present

## 2016-03-10 DIAGNOSIS — Z888 Allergy status to other drugs, medicaments and biological substances status: Secondary | ICD-10-CM | POA: Diagnosis not present

## 2016-03-10 DIAGNOSIS — Z9049 Acquired absence of other specified parts of digestive tract: Secondary | ICD-10-CM | POA: Diagnosis not present

## 2016-03-10 DIAGNOSIS — G894 Chronic pain syndrome: Secondary | ICD-10-CM | POA: Diagnosis present

## 2016-03-10 DIAGNOSIS — Z961 Presence of intraocular lens: Secondary | ICD-10-CM | POA: Diagnosis present

## 2016-03-10 DIAGNOSIS — I248 Other forms of acute ischemic heart disease: Secondary | ICD-10-CM | POA: Diagnosis present

## 2016-03-10 DIAGNOSIS — I129 Hypertensive chronic kidney disease with stage 1 through stage 4 chronic kidney disease, or unspecified chronic kidney disease: Secondary | ICD-10-CM | POA: Diagnosis present

## 2016-03-10 DIAGNOSIS — J9621 Acute and chronic respiratory failure with hypoxia: Secondary | ICD-10-CM | POA: Diagnosis present

## 2016-03-10 DIAGNOSIS — R0603 Acute respiratory distress: Secondary | ICD-10-CM

## 2016-03-10 DIAGNOSIS — Z91040 Latex allergy status: Secondary | ICD-10-CM

## 2016-03-10 DIAGNOSIS — Z79899 Other long term (current) drug therapy: Secondary | ICD-10-CM

## 2016-03-10 DIAGNOSIS — R778 Other specified abnormalities of plasma proteins: Secondary | ICD-10-CM

## 2016-03-10 DIAGNOSIS — R4182 Altered mental status, unspecified: Secondary | ICD-10-CM

## 2016-03-10 DIAGNOSIS — R0902 Hypoxemia: Secondary | ICD-10-CM

## 2016-03-10 LAB — CBC WITH DIFFERENTIAL/PLATELET
BASOS ABS: 0 10*3/uL (ref 0–0.1)
Basophils Relative: 0 %
EOS PCT: 0 %
Eosinophils Absolute: 0 10*3/uL (ref 0–0.7)
HEMATOCRIT: 43.3 % (ref 35.0–47.0)
Hemoglobin: 14.7 g/dL (ref 12.0–16.0)
LYMPHS ABS: 0.6 10*3/uL — AB (ref 1.0–3.6)
LYMPHS PCT: 6 %
MCH: 31.8 pg (ref 26.0–34.0)
MCHC: 33.9 g/dL (ref 32.0–36.0)
MCV: 93.8 fL (ref 80.0–100.0)
Monocytes Absolute: 0.7 10*3/uL (ref 0.2–0.9)
Monocytes Relative: 6 %
NEUTROS ABS: 9.6 10*3/uL — AB (ref 1.4–6.5)
Neutrophils Relative %: 88 %
PLATELETS: 241 10*3/uL (ref 150–440)
RBC: 4.62 MIL/uL (ref 3.80–5.20)
RDW: 16 % — ABNORMAL HIGH (ref 11.5–14.5)
WBC: 11 10*3/uL (ref 3.6–11.0)

## 2016-03-10 LAB — COMPREHENSIVE METABOLIC PANEL
ALK PHOS: 76 U/L (ref 38–126)
ALT: 11 U/L — AB (ref 14–54)
AST: 25 U/L (ref 15–41)
Albumin: 3.6 g/dL (ref 3.5–5.0)
Anion gap: 10 (ref 5–15)
BILIRUBIN TOTAL: 0.3 mg/dL (ref 0.3–1.2)
BUN: 16 mg/dL (ref 6–20)
CALCIUM: 9.5 mg/dL (ref 8.9–10.3)
CHLORIDE: 90 mmol/L — AB (ref 101–111)
CO2: 36 mmol/L — ABNORMAL HIGH (ref 22–32)
CREATININE: 1.25 mg/dL — AB (ref 0.44–1.00)
GFR, EST AFRICAN AMERICAN: 51 mL/min — AB (ref >60)
GFR, EST NON AFRICAN AMERICAN: 44 mL/min — AB (ref >60)
Glucose, Bld: 143 mg/dL — ABNORMAL HIGH (ref 65–99)
Potassium: 4.9 mmol/L (ref 3.5–5.1)
Sodium: 136 mmol/L (ref 135–145)
TOTAL PROTEIN: 7.1 g/dL (ref 6.5–8.1)

## 2016-03-10 LAB — TSH: TSH: 0.878 u[IU]/mL (ref 0.350–4.500)

## 2016-03-10 LAB — APTT: aPTT: 28 seconds (ref 24–36)

## 2016-03-10 LAB — TROPONIN I
TROPONIN I: 0.04 ng/mL — AB (ref <0.03)
Troponin I: 0.11 ng/mL (ref <0.03)
Troponin I: 0.11 ng/mL (ref <0.03)

## 2016-03-10 LAB — URINALYSIS COMPLETE WITH MICROSCOPIC (ARMC ONLY)
BILIRUBIN URINE: NEGATIVE
GLUCOSE, UA: NEGATIVE mg/dL
Hgb urine dipstick: NEGATIVE
Ketones, ur: NEGATIVE mg/dL
NITRITE: NEGATIVE
Protein, ur: 30 mg/dL — AB
Specific Gravity, Urine: 1.004 — ABNORMAL LOW (ref 1.005–1.030)
pH: 6 (ref 5.0–8.0)

## 2016-03-10 LAB — GLUCOSE, CAPILLARY: Glucose-Capillary: 88 mg/dL (ref 65–99)

## 2016-03-10 LAB — LACTIC ACID, PLASMA
LACTIC ACID, VENOUS: 3 mmol/L — AB (ref 0.5–1.9)
Lactic Acid, Venous: 2 mmol/L (ref 0.5–1.9)

## 2016-03-10 LAB — PROTIME-INR
INR: 0.98
PROTHROMBIN TIME: 13 s (ref 11.4–15.2)

## 2016-03-10 LAB — MRSA PCR SCREENING: MRSA by PCR: NEGATIVE

## 2016-03-10 LAB — HEMOGLOBIN A1C: Hgb A1c MFr Bld: 5.1 % (ref 4.0–6.0)

## 2016-03-10 MED ORDER — OXYCODONE HCL 5 MG PO TABS
15.0000 mg | ORAL_TABLET | ORAL | Status: DC | PRN
  Administered 2016-03-10: 30 mg via ORAL
  Filled 2016-03-10: qty 6

## 2016-03-10 MED ORDER — PREDNISONE 20 MG PO TABS
20.0000 mg | ORAL_TABLET | Freq: Every day | ORAL | Status: DC
Start: 2016-03-13 — End: 2016-03-10

## 2016-03-10 MED ORDER — SODIUM CHLORIDE 0.9 % IV BOLUS (SEPSIS)
1000.0000 mL | Freq: Once | INTRAVENOUS | Status: AC
  Administered 2016-03-10: 1000 mL via INTRAVENOUS

## 2016-03-10 MED ORDER — ALBUTEROL SULFATE (2.5 MG/3ML) 0.083% IN NEBU
2.5000 mg | INHALATION_SOLUTION | RESPIRATORY_TRACT | Status: DC
  Administered 2016-03-10: 2.5 mg via RESPIRATORY_TRACT
  Filled 2016-03-10: qty 3

## 2016-03-10 MED ORDER — PREDNISONE 10 MG PO TABS
5.0000 mg | ORAL_TABLET | Freq: Every day | ORAL | Status: DC
Start: 2016-03-15 — End: 2016-03-10

## 2016-03-10 MED ORDER — VITAMIN D3 10 MCG (400 UNIT) PO CAPS
800.0000 [IU] | ORAL_CAPSULE | Freq: Every day | ORAL | Status: DC
Start: 2016-03-10 — End: 2016-03-10

## 2016-03-10 MED ORDER — POLYETHYLENE GLYCOL 3350 17 G PO PACK: 17.0000 g | PACK | Freq: Every day | ORAL | Status: DC

## 2016-03-10 MED ORDER — CYCLOBENZAPRINE HCL 10 MG PO TABS: 10.0000 mg | ORAL_TABLET | Freq: Two times a day (BID) | ORAL | Status: DC

## 2016-03-10 MED ORDER — PREDNISONE 10 MG PO TABS: ORAL_TABLET | ORAL | 0 refills | Status: DC

## 2016-03-10 MED ORDER — AZITHROMYCIN 500 MG PO TABS: 500.0000 mg | ORAL_TABLET | Freq: Every day | ORAL | Status: DC

## 2016-03-10 MED ORDER — ALBUTEROL SULFATE (2.5 MG/3ML) 0.083% IN NEBU: 2.5000 mg | INHALATION_SOLUTION | Freq: Four times a day (QID) | RESPIRATORY_TRACT | Status: DC | PRN

## 2016-03-10 MED ORDER — ENOXAPARIN SODIUM 40 MG/0.4ML ~~LOC~~ SOLN: 40.0000 mg | SUBCUTANEOUS | Status: DC

## 2016-03-10 MED ORDER — ACETAMINOPHEN 325 MG PO TABS: 650.0000 mg | ORAL_TABLET | Freq: Four times a day (QID) | ORAL | Status: DC | PRN

## 2016-03-10 MED ORDER — BUDESONIDE 0.5 MG/2ML IN SUSP
0.5000 mg | Freq: Two times a day (BID) | RESPIRATORY_TRACT | Status: DC
  Administered 2016-03-10: 0.5 mg via RESPIRATORY_TRACT
  Filled 2016-03-10: qty 2

## 2016-03-10 MED ORDER — LORATADINE 10 MG PO TABS: 10.0000 mg | ORAL_TABLET | Freq: Every day | ORAL | Status: DC

## 2016-03-10 MED ORDER — VITAMIN D 1000 UNITS PO TABS: 1000.0000 [IU] | ORAL_TABLET | Freq: Every day | ORAL | Status: DC

## 2016-03-10 MED ORDER — TIOTROPIUM BROMIDE MONOHYDRATE 18 MCG IN CAPS
18.0000 ug | ORAL_CAPSULE | Freq: Every day | RESPIRATORY_TRACT | Status: DC
  Filled 2016-03-10: qty 5

## 2016-03-10 MED ORDER — DIAZEPAM 5 MG PO TABS: 10.0000 mg | ORAL_TABLET | Freq: Every day | ORAL | Status: DC

## 2016-03-10 MED ORDER — PROMETHAZINE HCL 25 MG PO TABS: 12.5000 mg | ORAL_TABLET | Freq: Four times a day (QID) | ORAL | Status: DC | PRN

## 2016-03-10 MED ORDER — PREDNISONE 20 MG PO TABS: 40.0000 mg | ORAL_TABLET | Freq: Every day | ORAL | Status: DC

## 2016-03-10 MED ORDER — DIAZEPAM 5 MG PO TABS: 5.0000 mg | ORAL_TABLET | Freq: Two times a day (BID) | ORAL | Status: DC

## 2016-03-10 MED ORDER — FAMOTIDINE 20 MG PO TABS: 40.0000 mg | ORAL_TABLET | Freq: Every day | ORAL | Status: DC

## 2016-03-10 MED ORDER — ARFORMOTEROL TARTRATE 15 MCG/2ML IN NEBU
15.0000 ug | INHALATION_SOLUTION | Freq: Two times a day (BID) | RESPIRATORY_TRACT | Status: DC
  Administered 2016-03-10: 15 ug via RESPIRATORY_TRACT
  Filled 2016-03-10 (×2): qty 2

## 2016-03-10 MED ORDER — ACETAMINOPHEN 650 MG RE SUPP: 650.0000 mg | Freq: Four times a day (QID) | RECTAL | Status: DC | PRN

## 2016-03-10 MED ORDER — PREDNISONE 10 MG PO TABS: 10.0000 mg | ORAL_TABLET | Freq: Every day | ORAL | Status: DC

## 2016-03-10 MED ORDER — SODIUM CHLORIDE 0.9% FLUSH: 3.0000 mL | Freq: Two times a day (BID) | INTRAVENOUS | Status: DC

## 2016-03-10 MED ORDER — CETYLPYRIDINIUM CHLORIDE 0.05 % MT LIQD: 7.0000 mL | Freq: Two times a day (BID) | OROMUCOSAL | Status: DC

## 2016-03-10 MED ORDER — PREDNISONE 20 MG PO TABS: 30.0000 mg | ORAL_TABLET | Freq: Every day | ORAL | Status: DC

## 2016-03-10 MED ORDER — AZITHROMYCIN 250 MG PO TABS: 250.0000 mg | ORAL_TABLET | Freq: Every day | ORAL | Status: DC

## 2016-03-10 MED ORDER — SODIUM CHLORIDE 0.9 % IV SOLN
INTRAVENOUS | Status: DC
  Administered 2016-03-10: 06:00:00 via INTRAVENOUS

## 2016-03-10 MED ORDER — CHLORHEXIDINE GLUCONATE 0.12 % MT SOLN: 15.0000 mL | Freq: Two times a day (BID) | OROMUCOSAL | Status: DC

## 2016-03-10 MED ORDER — DOCUSATE SODIUM 100 MG PO CAPS: 100.0000 mg | ORAL_CAPSULE | Freq: Two times a day (BID) | ORAL | Status: DC

## 2016-03-10 MED ORDER — MAGNESIUM OXIDE 400 (241.3 MG) MG PO TABS: 400.0000 mg | ORAL_TABLET | Freq: Every day | ORAL | Status: DC

## 2016-03-10 MED ORDER — DIAZEPAM 5 MG PO TABS: 5.0000 mg | ORAL_TABLET | Freq: Every morning | ORAL | Status: DC

## 2016-03-10 MED ORDER — PREDNISONE 20 MG PO TABS: 50.0000 mg | ORAL_TABLET | Freq: Every day | ORAL | Status: DC

## 2016-03-10 NOTE — ED Notes (Signed)
RT attempted to draw ABG x3. RT will not attempt again at this time dut to pt's refusal. MD made aware.
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
## 2016-03-10 NOTE — Progress Notes (Signed)
Clinical Education officer, museum (CSW) received consult for "Medications". RN Case Manager aware of above. Please reconsult if future social work needs arise. CSW signing off.   [employer], LCSW [phone]

## 2016-03-10 NOTE — Progress Notes (Signed)
Attempted 2x to draw ABG and unsuccessful both times. Additional RT attempted and was also unsuccessful. Dr. Edd Fabian notified.
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
## 2016-03-10 NOTE — Progress Notes (Signed)
COPD and smoking sensation info given to patient.

## 2016-03-10 NOTE — ED Notes (Signed)
Will transport to CCU when RT can move Bi-Pap.

## 2016-03-10 NOTE — ED Notes (Signed)
Pt is confused at this time. Husband states that pt was unresponsive to him at home.

## 2016-03-10 NOTE — Progress Notes (Signed)
Patient alert and oriented. On 2 liters, eating and urinating with no complications. Dr Tor Netters aware of patients troponin level and orders to discharge patient home. Patient has follow up visits with Dr Alva Garnet and sparks. Went over discharge instructions and patient had no further questions.

## 2016-03-10 NOTE — ED Notes (Signed)
Pt's speech is becoming clearer and pt does not remember how she came to the hospital. Pt is asking and answering questions appropriately at this time.

## 2016-03-10 NOTE — ED Triage Notes (Signed)
Per EMS: Pt unresponsive at EMS arrival. Pt stimulated with pen light to eyes and able to answer questions. Pt O2 Sat 70% on RA. Pt normally on 2L [place]. Pt had c/o of chills fever (103) earlier in day. Pt afebrile for EMS.

## 2016-03-10 NOTE — Care Management Note (Signed)
Case Management Note  Patient Details  Name: Rebecca Holland MRN: [id number] Date of Birth: [birth year]  Subjective/Objective:   Discharge to home. Rebecca Holland has Medicare A&B, and [employer]. Her only new prescription is for prednisone.  Rebecca Holland should discuss co-pay for medications concerns with her PVP to see if less expensive chronic medications are available for her.                  Action/Plan:   Expected Discharge Date:                  Expected Discharge Plan:     In-House Referral:     Discharge planning Services     Post Acute Care Choice:    Choice offered to:     DME Arranged:    DME Agency:     HH Arranged:    HH Agency:     Status of Service:     If discussed at [employer] of Stay Meetings, dates discussed:    Additional Comments:  Rebecca Holland A, RN 03/10/2016, 10:49 AM
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
## 2016-03-12 ENCOUNTER — Inpatient Hospital Stay: Payer: Medicare Other | Attending: Hematology and Oncology

## 2016-03-12 DIAGNOSIS — C679 Malignant neoplasm of bladder, unspecified: Secondary | ICD-10-CM

## 2016-03-12 DIAGNOSIS — Z452 Encounter for adjustment and management of vascular access device: Secondary | ICD-10-CM | POA: Diagnosis not present

## 2016-03-12 DIAGNOSIS — Z8551 Personal history of malignant neoplasm of bladder: Secondary | ICD-10-CM | POA: Insufficient documentation

## 2016-03-12 MED ORDER — SODIUM CHLORIDE 0.9% FLUSH
10.0000 mL | Freq: Once | INTRAVENOUS | Status: AC
  Administered 2016-03-12: 10 mL via INTRAVENOUS
  Filled 2016-03-12: qty 10

## 2016-03-12 MED ORDER — HEPARIN SOD (PORK) LOCK FLUSH 100 UNIT/ML IV SOLN
500.0000 [IU] | Freq: Once | INTRAVENOUS | Status: AC
  Administered 2016-03-12: 500 [IU] via INTRAVENOUS

## 2016-03-12 MED ORDER — HEPARIN SOD (PORK) LOCK FLUSH 100 UNIT/ML IV SOLN
INTRAVENOUS | Status: AC
  Filled 2016-03-12: qty 5

## 2016-03-15 LAB — CULTURE, BLOOD (ROUTINE X 2)
CULTURE: NO GROWTH
Culture: NO GROWTH

## 2016-04-04 ENCOUNTER — Ambulatory Visit (INDEPENDENT_AMBULATORY_CARE_PROVIDER_SITE_OTHER): Payer: Medicare Other | Admitting: Pulmonary Disease

## 2016-04-04 ENCOUNTER — Encounter: Payer: Self-pay | Admitting: Pulmonary Disease

## 2016-04-04 VITALS — BP 122/74 | HR 89 | Ht 67.0 in | Wt 150.0 lb

## 2016-04-04 DIAGNOSIS — J4550 Severe persistent asthma, uncomplicated: Secondary | ICD-10-CM | POA: Diagnosis not present

## 2016-04-04 DIAGNOSIS — J9622 Acute and chronic respiratory failure with hypercapnia: Secondary | ICD-10-CM

## 2016-04-04 DIAGNOSIS — J9621 Acute and chronic respiratory failure with hypoxia: Secondary | ICD-10-CM | POA: Diagnosis not present

## 2016-04-04 DIAGNOSIS — Z720 Tobacco use: Secondary | ICD-10-CM

## 2016-04-04 DIAGNOSIS — F17200 Nicotine dependence, unspecified, uncomplicated: Secondary | ICD-10-CM

## 2016-04-04 DIAGNOSIS — J449 Chronic obstructive pulmonary disease, unspecified: Secondary | ICD-10-CM

## 2016-04-04 MED ORDER — AZITHROMYCIN 250 MG PO TABS: ORAL_TABLET | ORAL | 11 refills | Status: DC

## 2016-04-04 NOTE — Patient Instructions (Addendum)
Continue budesonide, formoterol and Spiriva as previously prescribed We talked about the imperative of smoking cessation  Please start the Chantix!! Add Azithromycin 250 mg daily Follow up in 6 weeks

## 2016-04-07 NOTE — Progress Notes (Signed)
PROBLEMS: Severe COPD [place]  INTERVAL HISTORY: Seen in ED with COPD exacerbation. Initially was to be admitted but improved sufficiently and was discharge to home on a prednisone taper.  SUBJ: States "I'm good now". Has not started Chantix due to "bad things happened" in her life. Remains markedly limited by dyspnea. Smoking 1 ppd. Denies CP, fever, purulent sputum, hemoptysis, LE edema and calf tenderness   OBJ: Vitals:   04/04/16 1022  BP: 122/74  Pulse: 89  SpO2: 91%  Weight: 150 lb (68 kg)  Height: 5\' 7"  (1.702 m)   NAD HEENT WNL JVP not well visualized Distant BS, scattered wheezes/rhonchi Reg, no M NABS, soft No LE edema  Current Outpatient Prescriptions on File Prior to Visit  Medication Sig Dispense Refill  . albuterol (PROVENTIL HFA;VENTOLIN HFA) 108 (90 BASE) MCG/ACT inhaler Inhale 2 puffs into the lungs every 6 (six) hours as needed for wheezing or shortness of breath.    Marland Kitchen arformoterol (BROVANA) 15 MCG/2ML NEBU Take 2 mLs (15 mcg total) by nebulization 2 (two) times daily. 120 mL 12  . budesonide (PULMICORT) 0.5 MG/2ML nebulizer solution Take 2 mLs (0.5 mg total) by nebulization 2 (two) times daily. 120 mL 12  . Calcium-Magnesium-Vitamin D (CALCIUM 1200+D3 PO) Take 1 tablet by mouth daily.    . Cholecalciferol (VITAMIN D3) 400 units CAPS Take 800 Units by mouth daily.    . cyclobenzaprine (FLEXERIL) 10 MG tablet Take 10 mg by mouth 2 (two) times daily.     . diazepam (VALIUM) 5 MG tablet Take 10 mg by mouth at bedtime.     . Diphenhyd-Hydrocort-Nystatin (FIRST-DUKES MOUTHWASH) SUSP Use as directed 5 mLs in the mouth or throat 4 (four) times daily as needed (for thrush). Reported on 11/14/2015    . diphenoxylate-atropine (LOMOTIL) 2.5-0.025 MG tablet Take 1 tablet by mouth 4 (four) times daily as needed for diarrhea or loose stools. 30 tablet 0  . docusate sodium (COLACE) 50 MG capsule Take 100 mg by mouth 2 (two) times daily.     . DULoxetine (CYMBALTA) 20 MG  capsule Take 20 mg by mouth at bedtime.     . hyoscyamine (LEVSIN, ANASPAZ) 0.125 MG tablet Take 1 tablet (0.125 mg total) by mouth every 4 (four) hours as needed. (Patient taking differently: Take 0.125 mg by mouth every 4 (four) hours as needed for cramping. ) 30 tablet 1  . ipratropium-albuterol (DUONEB) 0.5-2.5 (3) MG/3ML SOLN Inhale 3 mLs into the lungs every 4 (four) hours as needed (for wheezing/shortness of breath). Reported on 11/14/2015    . lidocaine-prilocaine (EMLA) cream Apply 1 application topically as needed (prior to accessing port).     . loratadine (CLARITIN) 10 MG tablet Take 10 mg by mouth daily.    . magnesium oxide (MAG-OX) 400 (241.3 MG) MG tablet Take 1 tablet (400 mg total) by mouth daily. 30 tablet 0  . Meth-Hyo-M Bl-Na Phos-Ph Sal (URIBEL) 118 MG CAPS Take 118 mg by mouth every 6 (six) hours as needed (for bladder spasms).    . Misc Natural Products (COLON CARE PO) Take 1 capsule by mouth daily.    Marland Kitchen oxycodone (ROXICODONE) 30 MG immediate release tablet Take 30 mg by mouth every 4 (four) hours as needed for pain.     . polyethylene glycol (MIRALAX / GLYCOLAX) packet Take 17 g by mouth daily.    . psyllium (HYDROCIL/METAMUCIL) 95 % PACK Take 1 packet by mouth daily.    . ranitidine (ZANTAC) 150 MG tablet Take 300  mg by mouth at bedtime.     Marland Kitchen tiotropium (SPIRIVA) 18 MCG inhalation capsule Place 18 mcg into inhaler and inhale daily.    . varenicline (CHANTIX) 1 MG tablet Take 1 tablet (1 mg total) by mouth 2 (two) times daily. 60 tablet 3  . vitamin B-12 1000 MCG tablet Take 1 tablet (1,000 mcg total) by mouth daily. 30 tablet 0     DATA: CXR 03/10/16: NAC[facility] CT chest from 10/02/15 reviewed with pt.   IMPRESSION: 1) Acute on chronic respiratory failure with hypoxia and hypercapnia 2) recalcitrant smoker 3) COPD, severe 4) Asthmatic bronchitis, severe persistent, uncomplicated - wheezes noted on current exam   PLAN: Continue Spiriva Cont Budesonide 0.5 mg  arformetorol 15 mcg nebulized BID Continue Duoneb PRN Add Azithromycin 250 mg PO daily Counseled again @ length re: need for total smoking cessation.  Encouraged initiation of Chantix ROV 6 weeks   Merton Border, MD PCCM service Mobile [phone] Pager [phone] 04/07/2016

## 2016-04-09 ENCOUNTER — Ambulatory Visit: Payer: Medicare Other

## 2016-04-11 ENCOUNTER — Ambulatory Visit: Payer: Medicare Other | Admitting: Pulmonary Disease

## 2016-04-25 ENCOUNTER — Ambulatory Visit: Admission: RE | Admit: 2016-04-25 | Payer: Medicare Other | Source: Ambulatory Visit

## 2016-04-26 ENCOUNTER — Inpatient Hospital Stay: Payer: Medicare Other

## 2016-04-26 ENCOUNTER — Inpatient Hospital Stay: Payer: Medicare Other | Admitting: Hematology and Oncology

## 2016-04-29 ENCOUNTER — Encounter: Payer: Self-pay | Admitting: Hematology and Oncology

## 2016-05-09 ENCOUNTER — Ambulatory Visit
Admission: RE | Admit: 2016-05-09 | Discharge: 2016-05-09 | Disposition: A | Discharge status: Home / Self Care | Payer: Medicare Other | Source: Ambulatory Visit | Attending: Hematology and Oncology | Admitting: Hematology and Oncology

## 2016-05-09 DIAGNOSIS — C679 Malignant neoplasm of bladder, unspecified: Secondary | ICD-10-CM | POA: Diagnosis present

## 2016-05-09 DIAGNOSIS — D3502 Benign neoplasm of left adrenal gland: Secondary | ICD-10-CM | POA: Diagnosis not present

## 2016-05-09 DIAGNOSIS — D751 Secondary polycythemia: Secondary | ICD-10-CM | POA: Insufficient documentation

## 2016-05-09 DIAGNOSIS — R300 Dysuria: Secondary | ICD-10-CM | POA: Diagnosis present

## 2016-05-09 DIAGNOSIS — D3501 Benign neoplasm of right adrenal gland: Secondary | ICD-10-CM | POA: Diagnosis not present

## 2016-05-14 ENCOUNTER — Inpatient Hospital Stay: Payer: Medicare Other

## 2016-05-14 ENCOUNTER — Encounter: Payer: Self-pay | Admitting: Emergency Medicine

## 2016-05-14 ENCOUNTER — Inpatient Hospital Stay
Admission: EM | Admit: 2016-05-14 | Discharge: 2016-05-16 | DRG: 190 | Disposition: A | Discharge status: Home / Self Care | Payer: Medicare Other | Attending: Internal Medicine | Admitting: Internal Medicine

## 2016-05-14 ENCOUNTER — Inpatient Hospital Stay: Payer: Medicare Other | Admitting: Hematology and Oncology

## 2016-05-14 ENCOUNTER — Telehealth: Payer: Self-pay | Admitting: Pulmonary Disease

## 2016-05-14 ENCOUNTER — Emergency Department: Payer: Medicare Other

## 2016-05-14 DIAGNOSIS — J9602 Acute respiratory failure with hypercapnia: Secondary | ICD-10-CM

## 2016-05-14 DIAGNOSIS — J9601 Acute respiratory failure with hypoxia: Secondary | ICD-10-CM | POA: Diagnosis not present

## 2016-05-14 DIAGNOSIS — K219 Gastro-esophageal reflux disease without esophagitis: Secondary | ICD-10-CM | POA: Diagnosis present

## 2016-05-14 DIAGNOSIS — I21A1 Myocardial infarction type 2: Secondary | ICD-10-CM

## 2016-05-14 DIAGNOSIS — Z8551 Personal history of malignant neoplasm of bladder: Secondary | ICD-10-CM | POA: Diagnosis not present

## 2016-05-14 DIAGNOSIS — N189 Chronic kidney disease, unspecified: Secondary | ICD-10-CM | POA: Diagnosis present

## 2016-05-14 DIAGNOSIS — J9690 Respiratory failure, unspecified, unspecified whether with hypoxia or hypercapnia: Secondary | ICD-10-CM

## 2016-05-14 DIAGNOSIS — G8929 Other chronic pain: Secondary | ICD-10-CM | POA: Diagnosis present

## 2016-05-14 DIAGNOSIS — G629 Polyneuropathy, unspecified: Secondary | ICD-10-CM | POA: Diagnosis present

## 2016-05-14 DIAGNOSIS — Z9981 Dependence on supplemental oxygen: Secondary | ICD-10-CM | POA: Diagnosis not present

## 2016-05-14 DIAGNOSIS — F17200 Nicotine dependence, unspecified, uncomplicated: Secondary | ICD-10-CM

## 2016-05-14 DIAGNOSIS — E872 Acidosis: Secondary | ICD-10-CM | POA: Diagnosis present

## 2016-05-14 DIAGNOSIS — Z79899 Other long term (current) drug therapy: Secondary | ICD-10-CM | POA: Diagnosis not present

## 2016-05-14 DIAGNOSIS — I248 Other forms of acute ischemic heart disease: Secondary | ICD-10-CM | POA: Diagnosis present

## 2016-05-14 DIAGNOSIS — J441 Chronic obstructive pulmonary disease with (acute) exacerbation: Principal | ICD-10-CM | POA: Diagnosis present

## 2016-05-14 DIAGNOSIS — R0902 Hypoxemia: Secondary | ICD-10-CM | POA: Diagnosis present

## 2016-05-14 DIAGNOSIS — J9621 Acute and chronic respiratory failure with hypoxia: Secondary | ICD-10-CM | POA: Diagnosis present

## 2016-05-14 DIAGNOSIS — I129 Hypertensive chronic kidney disease with stage 1 through stage 4 chronic kidney disease, or unspecified chronic kidney disease: Secondary | ICD-10-CM | POA: Diagnosis present

## 2016-05-14 DIAGNOSIS — Z66 Do not resuscitate: Secondary | ICD-10-CM | POA: Diagnosis present

## 2016-05-14 DIAGNOSIS — E785 Hyperlipidemia, unspecified: Secondary | ICD-10-CM | POA: Diagnosis present

## 2016-05-14 DIAGNOSIS — Z7952 Long term (current) use of systemic steroids: Secondary | ICD-10-CM | POA: Diagnosis not present

## 2016-05-14 DIAGNOSIS — F17210 Nicotine dependence, cigarettes, uncomplicated: Secondary | ICD-10-CM | POA: Diagnosis present

## 2016-05-14 DIAGNOSIS — M797 Fibromyalgia: Secondary | ICD-10-CM | POA: Diagnosis present

## 2016-05-14 DIAGNOSIS — J9622 Acute and chronic respiratory failure with hypercapnia: Secondary | ICD-10-CM | POA: Diagnosis present

## 2016-05-14 DIAGNOSIS — A419 Sepsis, unspecified organism: Secondary | ICD-10-CM

## 2016-05-14 LAB — CREATININE, SERUM
CREATININE: 0.71 mg/dL (ref 0.44–1.00)
GFR calc non Af Amer: >60 mL/min (ref >60)

## 2016-05-14 LAB — COMPREHENSIVE METABOLIC PANEL
ALT: 11 U/L — ABNORMAL LOW (ref 14–54)
AST: 20 U/L (ref 15–41)
Albumin: 3.5 g/dL (ref 3.5–5.0)
Alkaline Phosphatase: 67 U/L (ref 38–126)
Anion gap: 4 — ABNORMAL LOW (ref 5–15)
BUN: 18 mg/dL (ref 6–20)
CHLORIDE: 90 mmol/L — AB (ref 101–111)
CO2: 41 mmol/L — ABNORMAL HIGH (ref 22–32)
Calcium: 8.8 mg/dL — ABNORMAL LOW (ref 8.9–10.3)
Creatinine, Ser: 0.88 mg/dL (ref 0.44–1.00)
GFR calc Af Amer: >60 mL/min (ref >60)
Glucose, Bld: 126 mg/dL — ABNORMAL HIGH (ref 65–99)
POTASSIUM: 4.7 mmol/L (ref 3.5–5.1)
SODIUM: 135 mmol/L (ref 135–145)
Total Bilirubin: 0.2 mg/dL — ABNORMAL LOW (ref 0.3–1.2)
Total Protein: 6.9 g/dL (ref 6.5–8.1)

## 2016-05-14 LAB — CBC WITH DIFFERENTIAL/PLATELET
Basophils Absolute: 0 10*3/uL (ref 0–0.1)
Basophils Relative: 0 %
EOS PCT: 0 %
Eosinophils Absolute: 0 10*3/uL (ref 0–0.7)
HCT: 46.9 % (ref 35.0–47.0)
HEMOGLOBIN: 15.2 g/dL (ref 12.0–16.0)
LYMPHS ABS: 0.5 10*3/uL — AB (ref 1.0–3.6)
LYMPHS PCT: 5 %
MCH: 30.9 pg (ref 26.0–34.0)
MCHC: 32.4 g/dL (ref 32.0–36.0)
MCV: 95.5 fL (ref 80.0–100.0)
Monocytes Absolute: 0.7 10*3/uL (ref 0.2–0.9)
Monocytes Relative: 7 %
NEUTROS PCT: 88 %
Neutro Abs: 9 10*3/uL — ABNORMAL HIGH (ref 1.4–6.5)
PLATELETS: 226 10*3/uL (ref 150–440)
RBC: 4.91 MIL/uL (ref 3.80–5.20)
RDW: 15.3 % — AB (ref 11.5–14.5)
WBC: 10.3 10*3/uL (ref 3.6–11.0)

## 2016-05-14 LAB — TROPONIN I
TROPONIN I: 0.21 ng/mL — AB (ref <0.03)
Troponin I: 0.21 ng/mL (ref <0.03)

## 2016-05-14 LAB — URINALYSIS COMPLETE WITH MICROSCOPIC (ARMC ONLY)
BILIRUBIN URINE: NEGATIVE
Bacteria, UA: NONE SEEN
Glucose, UA: NEGATIVE mg/dL
HGB URINE DIPSTICK: NEGATIVE
KETONES UR: NEGATIVE mg/dL
LEUKOCYTES UA: NEGATIVE
Nitrite: NEGATIVE
PH: 6 (ref 5.0–8.0)
PROTEIN: 100 mg/dL — AB
Specific Gravity, Urine: 1.019 (ref 1.005–1.030)

## 2016-05-14 LAB — INFLUENZA PANEL BY PCR (TYPE A & B)
H1N1FLUPCR: NOT DETECTED
Influenza A By PCR: NEGATIVE
Influenza B By PCR: NEGATIVE

## 2016-05-14 LAB — BLOOD GAS, VENOUS
Acid-Base Excess: 13.8 mmol/L — ABNORMAL HIGH (ref 0.0–2.0)
Bicarbonate: 46.7 mmol/L — ABNORMAL HIGH (ref 20.0–28.0)
FIO2: 100
O2 Saturation: 76.8 %
PATIENT TEMPERATURE: 37
PH VEN: 7.22 — AB (ref 7.250–7.430)
PO2 VEN: 50 mmHg — AB (ref 32.0–45.0)
pCO2, Ven: 114 mmHg (ref 44.0–60.0)

## 2016-05-14 LAB — LACTIC ACID, PLASMA: Lactic Acid, Venous: 0.9 mmol/L (ref 0.5–1.9)

## 2016-05-14 LAB — MAGNESIUM: MAGNESIUM: 1.7 mg/dL (ref 1.7–2.4)

## 2016-05-14 LAB — MRSA PCR SCREENING: MRSA BY PCR: NEGATIVE

## 2016-05-14 LAB — STREP PNEUMONIAE URINARY ANTIGEN: STREP PNEUMO URINARY ANTIGEN: NEGATIVE

## 2016-05-14 LAB — BRAIN NATRIURETIC PEPTIDE: B Natriuretic Peptide: 122 pg/mL — ABNORMAL HIGH (ref 0.0–100.0)

## 2016-05-14 MED ORDER — HEPARIN SODIUM (PORCINE) 5000 UNIT/ML IJ SOLN
5000.0000 [IU] | Freq: Three times a day (TID) | INTRAMUSCULAR | Status: DC
  Administered 2016-05-14 – 2016-05-15 (×4): 5000 [IU] via SUBCUTANEOUS
  Filled 2016-05-14 (×5): qty 1

## 2016-05-14 MED ORDER — GUAIFENESIN 100 MG/5ML PO SOLN
5.0000 mL | ORAL | Status: DC | PRN
  Administered 2016-05-16: 100 mg via ORAL
  Filled 2016-05-14: qty 5
  Filled 2016-05-14: qty 10

## 2016-05-14 MED ORDER — SODIUM CHLORIDE 0.9% FLUSH
3.0000 mL | Freq: Two times a day (BID) | INTRAVENOUS | Status: DC
  Administered 2016-05-14 – 2016-05-16 (×5): 3 mL via INTRAVENOUS

## 2016-05-14 MED ORDER — OXYCODONE HCL 5 MG PO TABS
30.0000 mg | ORAL_TABLET | ORAL | Status: DC | PRN
  Administered 2016-05-14 – 2016-05-16 (×9): 30 mg via ORAL
  Filled 2016-05-14 (×9): qty 6

## 2016-05-14 MED ORDER — DIAZEPAM 5 MG PO TABS
5.0000 mg | ORAL_TABLET | Freq: Two times a day (BID) | ORAL | Status: DC
  Administered 2016-05-14 – 2016-05-16 (×3): 10 mg via ORAL
  Filled 2016-05-14: qty 1
  Filled 2016-05-14: qty 10
  Filled 2016-05-14: qty 1
  Filled 2016-05-14: qty 2
  Filled 2016-05-14: qty 1

## 2016-05-14 MED ORDER — CLOPIDOGREL BISULFATE 75 MG PO TABS
75.0000 mg | ORAL_TABLET | Freq: Every day | ORAL | Status: DC
Start: 2016-05-14 — End: 2016-05-16
  Administered 2016-05-14 – 2016-05-16 (×3): 75 mg via ORAL
  Filled 2016-05-14 (×3): qty 1

## 2016-05-14 MED ORDER — METHYLPREDNISOLONE SODIUM SUCC 40 MG IJ SOLR
40.0000 mg | Freq: Two times a day (BID) | INTRAMUSCULAR | Status: DC
  Administered 2016-05-14 – 2016-05-16 (×4): 40 mg via INTRAVENOUS
  Filled 2016-05-14 (×4): qty 1

## 2016-05-14 MED ORDER — SODIUM CHLORIDE 0.9% FLUSH: 3.0000 mL | INTRAVENOUS | Status: DC | PRN

## 2016-05-14 MED ORDER — FAMOTIDINE 20 MG PO TABS
20.0000 mg | ORAL_TABLET | Freq: Two times a day (BID) | ORAL | Status: DC
  Administered 2016-05-14 – 2016-05-16 (×4): 20 mg via ORAL
  Filled 2016-05-14 (×4): qty 1

## 2016-05-14 MED ORDER — METHYLPREDNISOLONE SODIUM SUCC 125 MG IJ SOLR: 60.0000 mg | INTRAMUSCULAR | Status: DC

## 2016-05-14 MED ORDER — VANCOMYCIN HCL 10 G IV SOLR
1500.0000 mg | Freq: Once | INTRAVENOUS | Status: AC
  Administered 2016-05-14: 1500 mg via INTRAVENOUS
  Filled 2016-05-14: qty 1500

## 2016-05-14 MED ORDER — LORATADINE 10 MG PO TABS
10.0000 mg | ORAL_TABLET | Freq: Every day | ORAL | Status: DC
  Filled 2016-05-14: qty 1

## 2016-05-14 MED ORDER — DEXTROSE 5 % IV SOLN
1.0000 g | INTRAVENOUS | Status: DC
  Administered 2016-05-14 – 2016-05-15 (×2): 1 g via INTRAVENOUS
  Filled 2016-05-14 (×3): qty 10

## 2016-05-14 MED ORDER — ATORVASTATIN CALCIUM 20 MG PO TABS
40.0000 mg | ORAL_TABLET | Freq: Every day | ORAL | Status: DC
  Filled 2016-05-14 (×2): qty 2

## 2016-05-14 MED ORDER — SODIUM CHLORIDE 0.9 % IV SOLN: 250.0000 mL | INTRAVENOUS | Status: DC | PRN

## 2016-05-14 MED ORDER — DEXTROSE 5 % IV SOLN
2.0000 g | Freq: Once | INTRAVENOUS | Status: AC
  Administered 2016-05-14: 2 g via INTRAVENOUS
  Filled 2016-05-14: qty 2

## 2016-05-14 MED ORDER — ALBUTEROL SULFATE (2.5 MG/3ML) 0.083% IN NEBU: 2.5000 mg | INHALATION_SOLUTION | RESPIRATORY_TRACT | Status: DC | PRN

## 2016-05-14 MED ORDER — CYCLOBENZAPRINE HCL 10 MG PO TABS
10.0000 mg | ORAL_TABLET | Freq: Two times a day (BID) | ORAL | Status: DC
  Administered 2016-05-14 – 2016-05-16 (×4): 10 mg via ORAL
  Filled 2016-05-14 (×4): qty 1

## 2016-05-14 MED ORDER — SODIUM CHLORIDE 0.9 % IV BOLUS (SEPSIS)
1000.0000 mL | Freq: Once | INTRAVENOUS | Status: AC
  Administered 2016-05-14: 1000 mL via INTRAVENOUS

## 2016-05-14 MED ORDER — IPRATROPIUM-ALBUTEROL 0.5-2.5 (3) MG/3ML IN SOLN
3.0000 mL | Freq: Once | RESPIRATORY_TRACT | Status: AC
  Administered 2016-05-14: 3 mL via RESPIRATORY_TRACT
  Filled 2016-05-14: qty 3

## 2016-05-14 MED ORDER — ACETAMINOPHEN 650 MG RE SUPP: 650.0000 mg | Freq: Four times a day (QID) | RECTAL | Status: DC | PRN

## 2016-05-14 MED ORDER — METHYLPREDNISOLONE SODIUM SUCC 125 MG IJ SOLR
125.0000 mg | Freq: Once | INTRAMUSCULAR | Status: AC
  Administered 2016-05-14: 125 mg via INTRAVENOUS
  Filled 2016-05-14: qty 2

## 2016-05-14 MED ORDER — ACETAMINOPHEN 325 MG PO TABS: 650.0000 mg | ORAL_TABLET | Freq: Four times a day (QID) | ORAL | Status: DC | PRN

## 2016-05-14 MED ORDER — LEVALBUTEROL HCL 1.25 MG/0.5ML IN NEBU: 1.2500 mg | INHALATION_SOLUTION | Freq: Four times a day (QID) | RESPIRATORY_TRACT | Status: DC

## 2016-05-14 MED ORDER — AZITHROMYCIN 500 MG IV SOLR
500.0000 mg | INTRAVENOUS | Status: DC
  Administered 2016-05-14: 500 mg via INTRAVENOUS
  Filled 2016-05-14 (×2): qty 500

## 2016-05-14 MED ORDER — GUAIFENESIN ER 600 MG PO TB12
600.0000 mg | ORAL_TABLET | Freq: Two times a day (BID) | ORAL | Status: DC
  Filled 2016-05-14: qty 1

## 2016-05-14 MED ORDER — DEXTROMETHORPHAN POLISTIREX ER 30 MG/5ML PO SUER
30.0000 mg | Freq: Two times a day (BID) | ORAL | Status: DC
  Filled 2016-05-14 (×2): qty 5

## 2016-05-14 MED ORDER — POLYETHYLENE GLYCOL 3350 17 G PO PACK
17.0000 g | PACK | Freq: Every day | ORAL | Status: DC
  Administered 2016-05-14: 17 g via ORAL
  Filled 2016-05-14 (×3): qty 1

## 2016-05-14 MED ORDER — VANCOMYCIN HCL IN DEXTROSE 750-5 MG/150ML-% IV SOLN
750.0000 mg | Freq: Two times a day (BID) | INTRAVENOUS | Status: DC
  Administered 2016-05-14 – 2016-05-15 (×2): 750 mg via INTRAVENOUS
  Filled 2016-05-14 (×4): qty 150

## 2016-05-14 MED ORDER — TIOTROPIUM BROMIDE MONOHYDRATE 18 MCG IN CAPS
18.0000 ug | ORAL_CAPSULE | Freq: Every day | RESPIRATORY_TRACT | Status: DC
  Filled 2016-05-14: qty 5

## 2016-05-14 MED ORDER — IPRATROPIUM-ALBUTEROL 0.5-2.5 (3) MG/3ML IN SOLN
3.0000 mL | Freq: Four times a day (QID) | RESPIRATORY_TRACT | Status: DC
  Administered 2016-05-14 – 2016-05-16 (×9): 3 mL via RESPIRATORY_TRACT
  Filled 2016-05-14 (×9): qty 3

## 2016-05-14 MED ORDER — DOCUSATE SODIUM 100 MG PO CAPS
100.0000 mg | ORAL_CAPSULE | Freq: Two times a day (BID) | ORAL | Status: DC
  Administered 2016-05-14 – 2016-05-16 (×5): 100 mg via ORAL
  Filled 2016-05-14 (×5): qty 1

## 2016-05-14 MED ORDER — DM-GUAIFENESIN ER 30-600 MG PO TB12: 1.0000 | ORAL_TABLET | Freq: Two times a day (BID) | ORAL | Status: DC

## 2016-05-14 MED ORDER — SODIUM CHLORIDE 0.9% FLUSH
3.0000 mL | Freq: Two times a day (BID) | INTRAVENOUS | Status: DC
  Administered 2016-05-14 – 2016-05-16 (×4): 3 mL via INTRAVENOUS
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
## 2016-05-14 NOTE — Progress Notes (Signed)
Pharmacy Antibiotic Note  Rebecca Holland is a 65 y.o. female with a h/o COPD, CKD, HTN, and bladder cancer admitted with acute respiratory failure, possible PNA, UTI. Pharmacy has been consulted for vancomycin and ceftriaxone dosing. Patient also receiving azithromycin.   Plan: Vancomycin 1500 mg iv once in ED followed by 750 mg iv q 12 hours. Trough with the 5th dose. Goal trough 15-20 mcg/ml.   Ceftriaxone 1 g iv q 24 hours.   Height: 5\' 4"  (162.6 cm) Weight: 157 lb 12.8 oz (71.6 kg) IBW/kg (Calculated) : 54.7  Temp (24hrs), Avg:99.3 F (37.4 C), Min:99.3 F (37.4 C), Max:99.3 F (37.4 C)   Recent Labs Lab 05/14/16 0742 05/14/16 0743  WBC 10.3  --   CREATININE 0.88  --   LATICACIDVEN  --  0.9    Estimated Creatinine Clearance: 61.1 mL/min (by C-G formula based on SCr of 0.88 mg/dL).    Allergies  Allergen Reactions  . Iohexol Anaphylaxis  . Aspirin   . Betadine [Povidone Iodine] Itching  . Fluress [Fluorescein-Benoxinate]   . Nsaids Diarrhea  . Ondansetron Other (See Comments)    Reaction:  Makes pt weak and tired  . Tape Other (See Comments)    Reaction:  Peels pts skin off   . Latex Rash  . Sulfa Antibiotics Rash    Antimicrobials this admission: cefepime 10/3 >> x 1 vancomycin 10/3 >>  Azithromycin 10/3 >> Ceftriaxone 10/3 >>  Dose adjustments this admission:   Microbiology results: 10/3 BCx: pending 10/3 UCx: pending  10/3 Sputum: pending  10/3 MRSA PCR: pending  Thank you for allowing pharmacy to be a part of this patient's care.  Ulice Dash D 05/14/2016 1:21 PM
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
## 2016-05-14 NOTE — Progress Notes (Signed)
Pt admitted to ICU on 50% Bi-pap, lethargic, alert and oriented to self and place. -Weaned to [facility] at 1325. RR even and unlabored. Lung sounds with bilateral rhonchi. Pt with strong, non-productive cough. Alert and oriented x 4. NSR on cardiac monitor. Husband has been at bedside. Pt with orders to transfer to floor.

## 2016-05-14 NOTE — Progress Notes (Signed)
Patient transferred from ICU to 2A [id number], report from Lyn Henri, Therapist, sports. Oriented to room, Ascom phones, call bell and staff. Bed in low position. Fall safety plan reviewed, yellow non-skid socks in place, bed alarm on. Full assessment to Epic; skin assessed with Purcell Nails., RN. Telemetry box verified with tele clerk and Reather Converse NT: [phone]. Will continue to monitor.   Patient reporting 10/10 chronic back pain, provided with requested PRN pain medication. Per family, pharmacy needs to review medications with them since several medications that she takes have not been ordered yet, pharmacy tech aware and to come re-do med rec. Patients spouse states patient takes valium normally everyday and requested that it be ordered for patient since she is still anxious and agitated. Dr. Bridgett Larsson made aware of these concerns. This RN also asked MD for f/u ABG as last Co2 was 114 and patient is still slightly confused and very anxious and agitated. MD reports that if pulse ox sats are WNL, does not need repeat ABG. Will continue to monitor.

## 2016-05-14 NOTE — Telephone Encounter (Signed)
Pt husband calling wanting to let Dr Alva Garnet know that the patient is in the Hospital in ICU currently. Cancelled appt for 05/16/16. Wills end to Dr Alva Garnet as Juluis Rainier.
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
## 2016-05-14 NOTE — ED Triage Notes (Signed)
Patient arrives to ED via EMS after husband called EMS. EMS reports per husband patient is more confused, weak and is falling around a lot more here lately and thinks she has a UTI. EMS reports patient wears O2 at home 2L [place]. Patient appears to be confused alert to person and day of the week, unable to give place and full date of birth. EMS reports that they was unable to get a good SPO2 due to patients finger nail polish.

## 2016-05-14 NOTE — Care Management (Signed)
Patient admitted to icu stepdown due to need for continuous bipap.   She has been weaned to nasal cannula and there are orders for patient to transfer out of icu.  She lives with her husband and has a home cpap.  Says her symptoms of shortness of breath had sudden onset.  Chronic 02 through [facility].  

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
## 2016-05-14 NOTE — ED Notes (Signed)
MD at bedside. 

## 2016-05-15 ENCOUNTER — Inpatient Hospital Stay
Admit: 2016-05-15 | Discharge: 2016-05-15 | Disposition: A | Discharge status: Home / Self Care | Payer: Medicare Other | Attending: Cardiology | Admitting: Cardiology

## 2016-05-15 ENCOUNTER — Inpatient Hospital Stay: Payer: Medicare Other

## 2016-05-15 DIAGNOSIS — J9602 Acute respiratory failure with hypercapnia: Secondary | ICD-10-CM

## 2016-05-15 DIAGNOSIS — J9601 Acute respiratory failure with hypoxia: Secondary | ICD-10-CM

## 2016-05-15 LAB — URINE CULTURE: CULTURE: NO GROWTH

## 2016-05-15 LAB — CBC
HCT: 41.5 % (ref 35.0–47.0)
HEMOGLOBIN: 13.6 g/dL (ref 12.0–16.0)
MCH: 31 pg (ref 26.0–34.0)
MCHC: 32.8 g/dL (ref 32.0–36.0)
MCV: 94.6 fL (ref 80.0–100.0)
Platelets: 185 10*3/uL (ref 150–440)
RBC: 4.39 MIL/uL (ref 3.80–5.20)
RDW: 14.9 % — ABNORMAL HIGH (ref 11.5–14.5)
WBC: 7.4 10*3/uL (ref 3.6–11.0)

## 2016-05-15 LAB — BASIC METABOLIC PANEL
Anion gap: 5 (ref 5–15)
BUN: 20 mg/dL (ref 6–20)
CALCIUM: 8.8 mg/dL — AB (ref 8.9–10.3)
CHLORIDE: 94 mmol/L — AB (ref 101–111)
CO2: 37 mmol/L — AB (ref 22–32)
CREATININE: 0.65 mg/dL (ref 0.44–1.00)
GFR calc non Af Amer: >60 mL/min (ref >60)
GLUCOSE: 129 mg/dL — AB (ref 65–99)
Potassium: 4.4 mmol/L (ref 3.5–5.1)
Sodium: 136 mmol/L (ref 135–145)

## 2016-05-15 LAB — GLUCOSE, CAPILLARY: GLUCOSE-CAPILLARY: 120 mg/dL — AB (ref 65–99)

## 2016-05-15 LAB — PROCALCITONIN

## 2016-05-15 MED ORDER — AZITHROMYCIN 250 MG PO TABS
500.0000 mg | ORAL_TABLET | ORAL | Status: DC
  Administered 2016-05-15: 500 mg via ORAL
  Filled 2016-05-15: qty 2

## 2016-05-15 NOTE — Progress Notes (Signed)
CONCERNING: Antibiotic IV to Oral Route Change Policy  RECOMMENDATION: This patient is receiving azithromycin  by the intravenous route.  Based on criteria approved by the Pharmacy and Therapeutics Committee, the antibiotic(s) is/are being converted to the equivalent oral dose form(s).   DESCRIPTION: These criteria include:  Patient being treated for a respiratory tract infection, urinary tract infection, cellulitis or clostridium difficile associated diarrhea if on metronidazole  The patient is not neutropenic and does not exhibit a GI malabsorption state  The patient is eating (either orally or via tube) and/or has been taking other orally administered medications for a least 24 hours  The patient is improving clinically and has a Tmax < 100.5  If you have questions about this conversion, please contact the Pharmacy Department  []  ( [phone] )  [place] [x]  ( [phone] )  [facility] []  ( [phone] )  [place] []  ( [phone] )  [facility] []  ( [phone] )  [facility]    MLS 

## 2016-05-15 NOTE — Progress Notes (Signed)
Called by patient's RN to come give prn treatment due to low saturations and congestion in the left lung. Went to patient's room explained I was there to give prn breathing treatment that I had been called by her nurse,  She stated she did not want a breathing treatment at this time.  I checked her saturations which were 91% on 3lpm [place].

## 2016-05-15 NOTE — Progress Notes (Signed)
Patient ID: Rebecca Holland, female   DOB: [birth year], 65 y.o.   MRN: [id number]  [facility] PROGRESS NOTE  KAMRA FAUSNAUGH [id number] DOB: 1950-12-29 DOA: 05/14/2016 PCP: Dionisio David, MD  HPI/Subjective: Patient came in with altered mental status, feels okay. States her breathing is a little bit better. Complaints of fever in eyes, no pain or blurry vision.  Objective: Vitals:   05/15/16 0835 05/15/16 1147  BP:  (!) 150/64  Pulse: 86 95  Resp:  20  Temp:  98.7 F (37.1 C)    Filed Weights   05/14/16 0727  Weight: 71.6 kg (157 lb 12.8 oz)    ROS: Review of Systems  Constitutional: Negative for chills and fever.  Eyes: Negative for blurred vision.  Respiratory: Positive for cough, shortness of breath and wheezing.   Cardiovascular: Negative for chest pain.  Gastrointestinal: Negative for abdominal pain, constipation, diarrhea, nausea and vomiting.  Genitourinary: Negative for dysuria.  Musculoskeletal: Negative for joint pain.  Neurological: Negative for dizziness and headaches.   Exam: Physical Exam  Constitutional: She is oriented to person, place, and time.  HENT:  Nose: No mucosal edema.  Mouth/Throat: No oropharyngeal exudate or posterior oropharyngeal edema.  Eyes: Conjunctivae, EOM and lids are normal. Pupils are equal, round, and reactive to light.  Neck: No JVD present. Carotid bruit is not present. No edema present. No thyroid mass and no thyromegaly present.  Cardiovascular: S1 normal and S2 normal.  Exam reveals no gallop.   No murmur heard. Pulses:      Dorsalis pedis pulses are 2+ on the right side, and 2+ on the left side.  Respiratory: No respiratory distress. She has wheezes in the right middle field and the left middle field. She has no rhonchi. She has rales in the right lower field and the left lower field.  GI: Soft. Bowel sounds are normal. There is no tenderness.  Musculoskeletal:       Right ankle: She exhibits swelling.    Left ankle: She exhibits swelling.  Lymphadenopathy:    She has no cervical adenopathy.  Neurological: She is alert and oriented to person, place, and time. No cranial nerve deficit.  Skin: Skin is warm. No rash noted. Nails show no clubbing.  Psychiatric: She has a normal mood and affect.      Data Reviewed: Basic Metabolic Panel:  Recent Labs Lab 05/14/16 0742 05/14/16 1306 05/15/16 0433  NA 135  --  136  K 4.7  --  4.4  CL 90*  --  94*  CO2 41*  --  37*  GLUCOSE 126*  --  129*  BUN 18  --  20  CREATININE 0.88 0.71 0.65  CALCIUM 8.8*  --  8.8*  MG  --  1.7  --    Liver Function Tests:  Recent Labs Lab 05/14/16 0742  AST 20  ALT 11*  ALKPHOS 67  BILITOT 0.2*  PROT 6.9  ALBUMIN 3.5   CBC:  Recent Labs Lab 05/14/16 0742 05/15/16 0433  WBC 10.3 7.4  NEUTROABS 9.0*  --   HGB 15.2 13.6  HCT 46.9 41.5  MCV 95.5 94.6  PLT 226 185   Cardiac Enzymes:  Recent Labs Lab 05/14/16 0742 05/14/16 1306  TROPONINI 0.21* 0.21*   BNP (last 3 results)  Recent Labs  05/14/16 0743  BNP 122.0*     CBG:  Recent Labs Lab 05/14/16 1109  GLUCAP 120*    Recent Results (from the past 240 hour(s))  Blood  Culture (routine x 2)     Status: None (Preliminary result)   Collection Time: 05/14/16  7:42 AM  Result Value Ref Range Status   Specimen Description BLOOD RIGHT ARM  Final   Special Requests   Final    BOTTLES DRAWN AEROBIC AND ANAEROBIC AER 10ML ANA 8ML   Culture NO GROWTH < 24 HOURS  Final   Report Status PENDING  Incomplete  Urine culture     Status: None   Collection Time: 05/14/16  7:43 AM  Result Value Ref Range Status   Specimen Description URINE, RANDOM  Final   Special Requests NONE  Final   Culture NO GROWTH Performed at [facility]   Final   Report Status 05/15/2016 FINAL  Final  Blood Culture (routine x 2)     Status: None (Preliminary result)   Collection Time: 05/14/16  7:44 AM  Result Value Ref Range Status   Specimen  Description BLOOD LEFT ARM  Final   Special Requests   Final    BOTTLES DRAWN AEROBIC AND ANAEROBIC AER 5ML ANA 6ML   Culture NO GROWTH < 24 HOURS  Final   Report Status PENDING  Incomplete  MRSA PCR Screening     Status: None   Collection Time: 05/14/16 11:15 AM  Result Value Ref Range Status   MRSA by PCR NEGATIVE NEGATIVE Final    Comment:        The GeneXpert MRSA Assay (FDA approved for NASAL specimens only), is one component of a comprehensive MRSA colonization surveillance program. It is not intended to diagnose MRSA infection nor to guide or monitor treatment for MRSA infections.      Studies: Dg Chest Port 1 View  Result Date: 05/15/2016 CLINICAL DATA:  Respiratory failure. EXAM: PORTABLE CHEST 1 VIEW COMPARISON:  Radiograph of May 14, 2016. FINDINGS: Stable cardiomediastinal silhouette. No pneumothorax or pleural effusion is noted. Left subclavian Port-A-Cath is unchanged in position. No acute pulmonary disease is noted. Central pulmonary vascular congestion and pulmonary edema noted on prior exam appears to be significantly improved. Bony thorax is unremarkable. IMPRESSION: Significantly improved central pulmonary vascular congestion and pulmonary edema compared to prior exam. Electronically Signed   By: Marijo Conception, M.D.   On: 05/15/2016 07:12   Dg Chest Port 1 View  Result Date: 05/14/2016 CLINICAL DATA:  Worsening of confusion and weakness. EXAM: PORTABLE CHEST 1 VIEW COMPARISON:  03/10/2016 FINDINGS: Port-A-Cath remains in place with its tip in the SVC above the right atrium. There is persistent cardiomegaly and aortic atherosclerosis. There is venous hypertension with mild interstitial edema. No effusions. No acute bone finding. IMPRESSION: Probable congestive heart failure/fluid overload. Cardiomegaly, venous hypertension and mild interstitial edema. Electronically Signed   By: Nelson Chimes M.D.   On: 05/14/2016 08:28    Scheduled Meds: . atorvastatin  40 mg  Oral q1800  . azithromycin  500 mg Oral Q24H  . cefTRIAXone (ROCEPHIN)  IV  1 g Intravenous Q24H  . clopidogrel  75 mg Oral Daily  . cyclobenzaprine  10 mg Oral BID  . diazepam  5-10 mg Oral BID  . docusate sodium  100 mg Oral BID  . famotidine  20 mg Oral BID  . heparin  5,000 Units Subcutaneous Q8H  . ipratropium-albuterol  3 mL Nebulization Q6H  . methylPREDNISolone (SOLU-MEDROL) injection  40 mg Intravenous Q12H  . polyethylene glycol  17 g Oral Daily  . sodium chloride flush  3 mL Intravenous Q12H  . sodium chloride  flush  3 mL Intravenous Q12H  . vancomycin  750 mg Intravenous Q12H    Assessment/Plan:  1. Acute hypercarbic respiratory failure. Patient initially required BiPAP to blow off CO2 with a PCO2 of 114 on presentation. Patient's mental status is improved. BiPAP at night while here. Patient states that she has a trilogy machine at home that she cannot tolerate very much. I will have the care manager look into this and it may have to be adjusted. 2. COPD exacerbation continue Solu-Medrol and Zithromax.  DC Rocephin and vancomycin secondary to no pneumonia seen on chest x-ray. 3. Hyperlipidemia unspecified on atorvastatin 4. Elevated troponin. Likely demand ischemia 5. History of bladder cancer  6. GERD on famotidine 7. Possibility of fluid seen on chest x-ray. Awaiting echocardiogram results. Not quite sure if there is heart failure here. Hold off on diuretics.  Code Status:     Code Status Orders        Start     Ordered   05/14/16 1226  Do not attempt resuscitation (DNR)  Continuous    Question Answer Comment  In the event of cardiac or respiratory ARREST Do not call a "code blue"   In the event of cardiac or respiratory ARREST Do not perform Intubation, CPR, defibrillation or ACLS   In the event of cardiac or respiratory ARREST Use medication by any route, position, wound care, and other measures to relive pain and suffering. May use oxygen, suction and manual  treatment of airway obstruction as needed for comfort.      05/14/16 1225    Code Status History    Date Active Date Inactive Code Status Order ID Comments User Context   03/10/2016  4:49 AM 03/10/2016  5:00 PM Full Code [id number]  Harrie Foreman, MD ED   10/02/2015 10:54 PM 10/04/2015  3:48 PM Full Code [id number]  Bettey Costa, MD Inpatient   10/02/2015  6:24 PM 10/02/2015 10:54 PM DNR [id number]  Bettey Costa, MD ED   07/18/2015  3:09 AM 07/21/2015  7:09 PM Full Code [id number]  Harrie Foreman, MD Inpatient   07/08/2015 12:15 PM 07/11/2015  8:29 PM DNR [id number]  Bettey Costa, MD Inpatient      Disposition Plan: Home in next few days  Consultants:  Critical care specialist  Cardiology  Antibiotics:  Zithromax  Time spent: 20 minutes  [place]

## 2016-05-15 NOTE — Progress Notes (Signed)
*  PRELIMINARY RESULTS* Echocardiogram 2D Echocardiogram has been performed.  Sherrie Sport 05/15/2016, 2:24 PM

## 2016-05-15 NOTE — Care Management (Signed)
Dr Leslye Peer has stated patient (if she does not have one- informed CM she had a cpap) needs a  non invasive home vent.  Reached out to [facility] to inquire if the cpap they provided could be a non invasive vent.   Placed the referral form in patient shadow chart for attending to complete

## 2016-05-15 NOTE — Progress Notes (Signed)
VS taken.  Patient was on room air.  O2 sat on RA 83%.  Increase to 88 %  On 2 L.  Increase to 94% on 3 L
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
## 2016-05-16 ENCOUNTER — Ambulatory Visit: Payer: Medicare Other | Admitting: Pulmonary Disease

## 2016-05-16 ENCOUNTER — Telehealth: Payer: Self-pay | Admitting: Pulmonary Disease

## 2016-05-16 LAB — ECHOCARDIOGRAM COMPLETE
Height: 64 in
Weight: 2524.8 oz

## 2016-05-16 MED ORDER — ALBUTEROL SULFATE (2.5 MG/3ML) 0.083% IN NEBU: 2.5000 mg | INHALATION_SOLUTION | Freq: Four times a day (QID) | RESPIRATORY_TRACT | 0 refills | Status: AC | PRN

## 2016-05-16 MED ORDER — FLUTICASONE-SALMETEROL 115-21 MCG/ACT IN AERO: 2.0000 | INHALATION_SPRAY | Freq: Two times a day (BID) | RESPIRATORY_TRACT | 0 refills | Status: DC

## 2016-05-16 MED ORDER — PREDNISONE 5 MG PO TABS: ORAL_TABLET | ORAL | 0 refills | Status: DC

## 2016-05-16 MED ORDER — ALBUTEROL SULFATE HFA 108 (90 BASE) MCG/ACT IN AERS: 2.0000 | INHALATION_SPRAY | Freq: Four times a day (QID) | RESPIRATORY_TRACT | 0 refills | Status: AC | PRN

## 2016-05-16 MED ORDER — PREDNISONE 20 MG PO TABS
40.0000 mg | ORAL_TABLET | Freq: Once | ORAL | Status: AC
  Administered 2016-05-16: 40 mg via ORAL
  Filled 2016-05-16: qty 2

## 2016-05-16 NOTE — Care Management Important Message (Signed)
Important Message  Patient Details  Name: Rebecca Holland MRN: [id number] Date of Birth: 1951-06-13   Medicare Important Message Given:  Yes    Katrina Stack, RN 05/16/2016, 9:51 AM

## 2016-05-16 NOTE — Telephone Encounter (Signed)
Pt is being discharged today from hospital But pt states she has a machine for her breathing at home If we need to change something we need to contact them But pt is not aware and was not able to help [place] [facility] [place] with this Please call Ms Rebecca Holland back with the info on her machine.
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
## 2016-05-16 NOTE — Care Management (Signed)
Initiating referral for non invasive home vent with [facility].  This agency did not provide the current home cpap patient has been using fir the past several years

## 2016-05-16 NOTE — Progress Notes (Signed)
PT Cancellation Note  Patient Details Name: Rebecca Holland MRN: [id number] DOB: Aug 22, 1950   Cancelled Treatment:    Reason Eval/Treat Not Completed: Other (comment): Pt dressed and sitting up in bed eating lunch.  Pt reports she does not need PT at this time at the hospital or at home.  Pt and RN endorse pt had been up ambulating in room without trouble today.  Pt has assist at home from care giver and husband and has a RW and cane that she uses as needed.  Pt has 1 step to enter home. Please re-consult if new need arises otherwise signing off.  Zyrah Wiswell A Larrissa Stivers, PT 05/16/2016, 1:55 PM

## 2016-05-16 NOTE — Discharge Instructions (Signed)
You must wear your bipap or trilogy machine at night, every night

## 2016-05-16 NOTE — Progress Notes (Signed)
SUBJECTIVE: Patient is up in the chair, alert and expressing that she is very anxious to go home. She denies any chest pain or tightness and has jitteriness related to her breathing treatments.   Vitals:   05/15/16 1942 05/16/16 0523 05/16/16 0524 05/16/16 0824  BP:  (!) 163/90 (!) 164/80 (!) 156/98  Pulse:  86 82 94  Resp:  18  14  Temp:  98 F (36.7 C)  97.5 F (36.4 C)  TempSrc:  Oral  Oral  SpO2: 91% 92% 95% 93%  Weight:      Height:        Intake/Output Summary (Last 24 hours) at 05/16/16 0850 Last data filed at 05/16/16 0600  Gross per 24 hour  Intake             1050 ml  Output             1800 ml  Net             -750 ml    LABS: Basic Metabolic Panel:  Recent Labs  05/14/16 0742 05/14/16 1306 05/15/16 0433  NA 135  --  136  K 4.7  --  4.4  CL 90*  --  94*  CO2 41*  --  37*  GLUCOSE 126*  --  129*  BUN 18  --  20  CREATININE 0.88 0.71 0.65  CALCIUM 8.8*  --  8.8*  MG  --  1.7  --    Liver Function Tests:  Recent Labs  05/14/16 0742  AST 20  ALT 11*  ALKPHOS 67  BILITOT 0.2*  PROT 6.9  ALBUMIN 3.5   No results for input(s): LIPASE, AMYLASE in the last 72 hours. CBC:  Recent Labs  05/14/16 0742 05/15/16 0433  WBC 10.3 7.4  NEUTROABS 9.0*  --   HGB 15.2 13.6  HCT 46.9 41.5  MCV 95.5 94.6  PLT 226 185   Cardiac Enzymes:  Recent Labs  05/14/16 0742 05/14/16 1306  TROPONINI 0.21* 0.21*   BNP: Invalid input(s): POCBNP D-Dimer: No results for input(s): DDIMER in the last 72 hours. Hemoglobin A1C: No results for input(s): HGBA1C in the last 72 hours. Fasting Lipid Panel: No results for input(s): CHOL, HDL, LDLCALC, TRIG, CHOLHDL, LDLDIRECT in the last 72 hours. Thyroid Function Tests: No results for input(s): TSH, T4TOTAL, T3FREE, THYROIDAB in the last 72 hours.  Invalid input(s): FREET3 Anemia Panel: No results for input(s): VITAMINB12, FOLATE, FERRITIN, TIBC, IRON, RETICCTPCT in the last 72 hours.   PHYSICAL  EXAM General: Well developed, well nourished, in no acute distress HEENT:  Normocephalic and atramatic Neck:  No JVD.  Lungs: Clear bilaterally to auscultation and percussion. Heart: HRRR . Normal S1 and S2 without gallops or murmurs.  Abdomen: Bowel sounds are positive, abdomen soft and non-tender  Msk:  Back normal, normal gait. Normal strength and tone for age. Extremities: No clubbing, cyanosis or edema.   Neuro: Alert and oriented X 3. Psych:  Good affect, responds appropriately  TELEMETRY: Normal sinus rhythm  ASSESSMENT AND PLAN: Elevated troponins likely related to demand ischemia in the setting of hypoxia. Patient has no previous cardiac history but she is a longtime smoker and assess history of hypertension, elevated cholesterol, and severe COPD with chronic oxygen use. She has been chest pain-free. Echocardiogram is pending. The plan is for outpatient workup with a nuclear stress test in the office. Patient has been given an appointment for hospital follow-up to discuss future testing on October 12  at 11:00.  Active Problems:   Acute respiratory failure with hypoxia and hypercapnia ([place])    Daune Perch, NP 05/16/2016 8:50 AM

## 2016-05-16 NOTE — Care Management (Addendum)
Patient now says that she does not have a cpap or bipap at [facility] but that she has "one of those [employer].  She does not know if it is for hypoventilation syndrome.  It sounds as though she may be describing a triology - After many calls, found that patient does have a non invasive home ventilator through [facility].  Spoke with agency regarding patient concerns that it may not be working correctly and asked that home visit be made.  Patient to call agency when she arrives home.  Has declined home health

## 2016-05-16 NOTE — Consult Note (Signed)
PULMONARY CONSULT NOTE  Requesting MD/Service: [facility] Date of initial consultation: 05/14/16 Reason for consultation: acute on chronic respiratory failure  PT PROFILE: 74 F smoker followed as outpt by Dr Alva Garnet with chronic hypercarbic and hypoxemic respiratory failure due to COPD admitted 05/14/16 with acute deterioration, severe hypercarbia  HPI:  feels much better since admission chronic SOb on chronic oxygen therapy   MEDICATIONS: I have reviewed all medications and confirmed regimen as documented    ROS: No myalgias/arthralgias, unexplained weight loss or weight gain No new focal weakness or sensory deficits No otalgia, hearing loss, visual changes, nasal and sinus symptoms, mouth and throat problems No neck pain or adenopathy No abdominal pain, N/V/D, diarrhea, change in bowel pattern No dysuria, change in urinary pattern   Vitals:   05/15/16 1930 05/15/16 1942 05/16/16 0523 05/16/16 0524  BP: (!) 161/76  (!) 163/90 (!) 164/80  Pulse: 100  86 82  Resp: 14  18   Temp: 98.4 F (36.9 C)  98 F (36.7 C)   TempSrc: Oral  Oral   SpO2: 90% 91% 92% 95%  Weight:      Height:         EXAM:  Gen: RASS No overt respiratory distress  HEENT: NCAT, sclera white, oropharynx normal Neck: Supple without LAN, thyromegaly, JVD Lungs: breath sounds: moderately diminished, percussion: normal, diffuse rhonchi, scattered wheezes Cardiovascular: RRR, no murmurs noted Abdomen: Soft, nontender, normal BS Ext: without clubbing, cyanosis, edema Neuro: CNs grossly intact, motor and sensory intact Skin: Limited exam, no lesions noted  DATA:   BMP Latest Ref Rng & Units 05/15/2016 05/14/2016 05/14/2016  Glucose 65 - 99 mg/dL 129(H) - 126(H)  BUN 6 - 20 mg/dL 20 - 18  Creatinine 0.44 - 1.00 mg/dL 0.65 0.71 0.88  Sodium 135 - 145 mmol/L 136 - 135  Potassium 3.5 - 5.1 mmol/L 4.4 - 4.7  Chloride 101 - 111 mmol/L 94(L) - 90(L)  CO2 22 - 32 mmol/L 37(H) - 41(H)  Calcium 8.9 -  10.3 mg/dL 8.8(L) - 8.8(L)    CBC Latest Ref Rng & Units 05/15/2016 05/14/2016 03/10/2016  WBC 3.6 - 11.0 K/uL 7.4 10.3 11.0  Hemoglobin 12.0 - 16.0 g/dL 13.6 15.2 14.7  Hematocrit 35.0 - 47.0 % 41.5 46.9 43.3  Platelets 150 - 440 K/uL 185 226 241     IMPRESSION:   1) chronic hypercarbic/hypoxemic respiratory failure 2) 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 We will reschedule this for 2-3 weeks from now    Patient/Family are satisfied with Plan of action and management. All questions answered  Corrin Parker, M.D.  [facility]  Medical Director [place] Director [facility] Cardio-Pulmonary Department

## 2016-05-16 NOTE — Telephone Encounter (Signed)
Spoke with Rebecca Holland and she states she has found the information she needed. Will close encounter.

## 2016-05-19 LAB — CULTURE, BLOOD (ROUTINE X 2)
CULTURE: NO GROWTH
CULTURE: NO GROWTH

## 2016-05-22 ENCOUNTER — Telehealth: Payer: Self-pay | Admitting: Pulmonary Disease

## 2016-05-22 ENCOUNTER — Ambulatory Visit (INDEPENDENT_AMBULATORY_CARE_PROVIDER_SITE_OTHER): Payer: Medicare Other | Admitting: Internal Medicine

## 2016-05-22 ENCOUNTER — Encounter: Payer: Self-pay | Admitting: Internal Medicine

## 2016-05-22 VITALS — BP 120/80 | HR 94 | Ht 68.0 in | Wt 150.0 lb

## 2016-05-22 DIAGNOSIS — J441 Chronic obstructive pulmonary disease with (acute) exacerbation: Secondary | ICD-10-CM

## 2016-05-22 DIAGNOSIS — J9622 Acute and chronic respiratory failure with hypercapnia: Secondary | ICD-10-CM

## 2016-05-22 MED ORDER — PREDNISONE 10 MG PO TABS: ORAL_TABLET | ORAL | 1 refills | Status: DC

## 2016-05-22 MED ORDER — PREDNISONE 20 MG PO TABS: 20.0000 mg | ORAL_TABLET | Freq: Every day | ORAL | 1 refills | Status: DC

## 2016-05-22 NOTE — Telephone Encounter (Signed)
Pt husband called, states pt is very weak, states she cannot hold anything in her hands, and very unsteady when she walks. States also, she cannot stay awake. He states last time this happened she had to be taken to the hospital. Please call.

## 2016-05-22 NOTE — Progress Notes (Signed)
* [facility]     Assessment and Plan:   Acute on Chronic hypercapnic respiratory failure. Chronic hypoxic respiratory failure.  --She has had 4 separate admissions to the hospital with respiratory issues, pneumonia, cystitis over the past several months.  --Discussed with husband today, she declines hospitalization, will have her go directly home and use bipap for the rest of the day; and every night, also bipap prn during the day.   AECOPD.  -Continue prednisone indefintely due to recurrent exacerbations; continue at 30 mg daily.   Adrenal insuffiency.  --Notes lethargy now on prednisone 5 mg daily, as above, will increase prednisone to 30 mg daily.   Polypharmacy --May be contributing to her lethargy.  --Continue to use flexeril and valium sparingly as they can reduce respiration.   COPD/Asthma -  - FEV1 50%, previously on Symbicort and Spriva, now switched to anoro.  - maintain sats >88%, on 2-3L continuous as an outpatient - has chronic RLL atlectasis and scarring, most likely from previous infections - recommend Incentive spirometry and tobacco cessation  -Nicotine Abuse.  -Discussed smoking cessation.  Bladder Cancer - stage II (T2NxM0) invasive high grade bladder cancer s/p 3 cycles of gemcitabine and carboplatin (01/11/2015 - 03/08/2015). She is currently undergoing concurrent weekly chemotherapy (carboplatin AUC 2) with daily radiation (began 05/02/2015; last 06/30/2015). She was not able to complete her most recent cycle of chemo. She has developed bad UTI/cystitis with radiation.  This has contributed to her COPD exacerbations due to reduced immunity related to chemotherapy and radiation.  Advanced Care Planning: Discussed case with pt and pt's caregiver.  I explained the patient has experienced a progressive downward course over the past few months; she is now sleepy all the time, likely due to elevated Co2 and possibly some element of  adrenal insufficiency.   A further downward course is predictable and likely, will likely involve further hospital admissions, intubation, with further decline in functional status if aggressive care is desired.   Pt's caregiver discussed the situation with her family and with the patient.    The patient's husband and the patient decided that her code status would be DNR. I explained that she may pass away in the coming days-weeks at her current course. She would prefer to pass away at home vs. In the hospital. I explained if she was admitted to the hospital the course would be similar to previous; however we could enroll in her hospice and this would be the safest way to do this. She reiterates that she does not want to be admitted to the hospital. Therefore we will enroll her in hospice at home.   -Marda Stalker, M.D.  Time spent in discussion 18 min.   --------------------------------------------------  Date: 05/22/2016  MRN# [id number] Rebecca Holland August 11, 1951   Rebecca Holland is a 65 y.o. old female seen in follow up for chief complaint of  Chief Complaint  Patient presents with  . Acute Visit    recent admission on 05-14-16 pt states since being discharged      HPI:   Patient is a 65 year old female smoker with a past medical history of COPD, bladder cancer on chemotherapy. Her COPD is managed with 3 L pulse dose oxygen. Today she is very sleepy, but arousable, and answers questions. She has decreased her smoking to just under a pack a day. Her husband notes that she is continuously over the last 3 days, she is now using her prescribed trilogy device every night for  the last 3 or 4 days. Husband notes that when she is off of it for the first 30 minutes a morning she is awake and alert, but her mental status soon goes downhill again. He is tried her on it intermittently and notes that it helps improve her mental status but then her mental status will decline again. She  has been progressively more tired over the last 3 days. She is having trouble standing up and holding things such as a telephone because of weakness and nodding off to go to sleep.  she is taking the prednisone 5 mg daily, we have diagnosed her in the past with suspected adrenal insufficiency.     Blood gas 07/19/15. PH 7.41; CO2 59.    Medication:   Outpatient Encounter Prescriptions as of 05/22/2016  Medication Sig  . albuterol (PROVENTIL HFA;VENTOLIN HFA) 108 (90 Base) MCG/ACT inhaler Inhale 2 puffs into the lungs every 6 (six) hours as needed for wheezing or shortness of breath.  Marland Kitchen albuterol (PROVENTIL) (2.5 MG/3ML) 0.083% nebulizer solution Take 3 mLs (2.5 mg total) by nebulization every 6 (six) hours as needed for wheezing or shortness of breath.  Marland Kitchen azithromycin (ZITHROMAX) 250 MG tablet One PO daily  . Cholecalciferol (VITAMIN D3) 400 units CAPS Take 800 Units by mouth daily.  . cyclobenzaprine (FLEXERIL) 10 MG tablet Take 10 mg by mouth 2 (two) times daily.   . diazepam (VALIUM) 5 MG tablet Take 5-10 mg by mouth 2 (two) times daily. 5mg  in the morning and 10mg  at bedtime  . docusate sodium (COLACE) 50 MG capsule Take 100 mg by mouth 2 (two) times daily.   . fluticasone-salmeterol (ADVAIR HFA) 115-21 MCG/ACT inhaler Inhale 2 puffs into the lungs 2 (two) times daily.  Marland Kitchen loratadine (CLARITIN) 10 MG tablet Take 10 mg by mouth daily.  Marland Kitchen oxycodone (ROXICODONE) 30 MG immediate release tablet Take 30 mg by mouth every 4 (four) hours as needed for pain.   . polyethylene glycol (MIRALAX / GLYCOLAX) packet Take 17 g by mouth daily.  . predniSONE (DELTASONE) 5 MG tablet 6 tabs po day; 5 tabs po day2; 4 tabs po day3; 3 tabs po day4; 2 tabs po day5; 1 tab daily afterwards until seen by pulmonary  . ranitidine (ZANTAC) 150 MG tablet Take 300 mg by mouth at bedtime.   Marland Kitchen tiotropium (SPIRIVA HANDIHALER) 18 MCG inhalation capsule Place 18 mcg into inhaler and inhale daily.    Facility-Administered  Encounter Medications as of 05/22/2016  Medication  . sodium chloride flush (NS) 0.9 % injection 10 mL     Allergies:  Iohexol; Aspirin; Betadine [povidone iodine]; Fluress [fluorescein-benoxinate]; Nsaids; Ondansetron; Tape; Latex; and Sulfa antibiotics  Review of Systems: Gen:  Denies  fever, sweats. HEENT: Denies blurred vision. Cvc:  No dizziness, chest pain or heaviness Resp:   Denies cough or sputum porduction. Gi: Denies swallowing difficulty, stomach pain. constipation, bowel incontinence Gu:  Denies bladder incontinence, burning urine Ext:   No Joint pain, stiffness. Skin: No skin rash, easy bruising. Endoc:  No polyuria, polydipsia. Psych: No depression, insomnia. Other:  All other systems were reviewed and found to be negative other than what is mentioned in the HPI.   Physical Examination:   VS: BP 120/80 (BP Location: Left Arm, Cuff Size: Normal)   Pulse 94   Ht 5\' 8"  (1.727 m)   Wt 150 lb (68 kg) Comment: weight is per pt.  SpO2 92%   BMI 22.81 kg/m   General Appearance: No distress  Neuro:without focal findings,  speech normal,  HEENT: PERRLA, EOM intact. Pulmonary: normal breath sounds, No wheezing.   CardiovascularNormal S1,S2.  No m/r/g.   Abdomen: Benign, Soft, non-tender. Renal:  No costovertebral tenderness  GU:  Not performed at this time. Endoc: No evident thyromegaly, no signs of acromegaly. Skin:   warm, no rash. Extremities: normal, no cyanosis, clubbing.   LABORATORY PANEL:   CBC No results for input(s): WBC, HGB, HCT, PLT in the last 168 hours. ------------------------------------------------------------------------------------------------------------------  Chemistries  No results for input(s): NA, K, CL, CO2, GLUCOSE, BUN, CREATININE, CALCIUM, MG, AST, ALT, ALKPHOS, BILITOT in the last 168 hours.  Invalid input(s):  GFRCGP ------------------------------------------------------------------------------------------------------------------  Cardiac Enzymes No results for input(s): TROPONINI in the last 168 hours. ------------------------------------------------------------  RADIOLOGY:   No results found for this or any previous visit. Results for orders placed during the hospital encounter of 06/12/15  DG Chest 2 View   Narrative CLINICAL DATA:  Weakness  EXAM: CHEST  2 VIEW  COMPARISON:  03/08/2015  FINDINGS: Cardiomediastinal silhouette is stable. Mild hyperinflation again noted. Left Port-A-Cath is unchanged in position. No infiltrate or pulmonary edema.  IMPRESSION: No active cardiopulmonary disease.   Electronically Signed   By: Lahoma Crocker M.D.   On: 06/12/2015 14:08    ------------------------------------------------------------------------------------------------------------------  Thank  you for allowing [facility] to assist in the care of your patient. Our recommendations are noted above.  Please contact us if we can be of further service.   Marda Stalker, MD.  [facility]  Patricia Pesa, M.D.  Vilinda Boehringer, M.D.  Merton Border, M.D

## 2016-05-22 NOTE — Telephone Encounter (Signed)
Pt had recent admission on 10-3 to 10-5 pt states since being discharged she has had increased weakness, having trouble taking a deep breath, very unsteady when walking & having trouble holding anything in her hands.  Pt also states that she is having trouble with her trilogy machine but [facility] is coming out this afternoon to take a look at that.  DR please advise. Thanks.

## 2016-05-22 NOTE — Telephone Encounter (Signed)
After speaking with pt to give her DR recommendations, I noticed pt seemed more confused then the first time I spoke with her and was unable to hold a convocation with me. I made DR aware of this and he recommend that she go to the ED. I have called pt husband and made him aware of this.  Pt husband voiced understanding and had no further questions. Nothing further needed.

## 2016-05-22 NOTE — Telephone Encounter (Signed)
She should come in today or tomorrow for a sick visit.

## 2016-05-22 NOTE — Patient Instructions (Signed)
Your having continued low oxygen levels and elevated carbon dioxide levels which are making her sleepy.  -Continue to use her air or BiPAP machine every night, and during the day as needed for sleepiness.  Due to  end-stage lung disease, we will be contacting hospice to get enrollment started.

## 2016-05-24 ENCOUNTER — Telehealth: Payer: Self-pay

## 2016-05-24 NOTE — Telephone Encounter (Signed)
Yes, however I'm in the unit next week so it might be a while.

## 2016-05-24 NOTE — Telephone Encounter (Signed)
Received call from pt husband, who would like to know if DR would be willing to fill out FMLA forms for him so that he is able to take care of his wife.  DR please advise.

## 2016-05-27 ENCOUNTER — Encounter: Payer: Self-pay | Admitting: Pulmonary Disease

## 2016-05-27 ENCOUNTER — Ambulatory Visit (INDEPENDENT_AMBULATORY_CARE_PROVIDER_SITE_OTHER): Admitting: Pulmonary Disease

## 2016-05-27 ENCOUNTER — Telehealth: Payer: Self-pay

## 2016-05-27 VITALS — BP 160/100 | HR 94 | Ht 68.0 in | Wt 151.2 lb

## 2016-05-27 DIAGNOSIS — J9611 Chronic respiratory failure with hypoxia: Secondary | ICD-10-CM | POA: Diagnosis not present

## 2016-05-27 MED ORDER — BUDESONIDE 0.25 MG/2ML IN SUSP: 0.5000 mg | Freq: Two times a day (BID) | RESPIRATORY_TRACT | 12 refills | Status: AC

## 2016-05-27 MED ORDER — VARENICLINE TARTRATE 1 MG PO TABS: 1.0000 mg | ORAL_TABLET | Freq: Two times a day (BID) | ORAL | 5 refills | Status: AC

## 2016-05-27 MED ORDER — ARFORMOTEROL TARTRATE 15 MCG/2ML IN NEBU: 15.0000 ug | INHALATION_SOLUTION | Freq: Two times a day (BID) | RESPIRATORY_TRACT | 10 refills | Status: AC

## 2016-05-27 MED ORDER — PREDNISONE 10 MG PO TABS: ORAL_TABLET | ORAL | 1 refills | Status: DC

## 2016-05-27 NOTE — Telephone Encounter (Signed)
Pt husband is made aware that DS is willing to fill out the forms. Pt has an appt scheduled for today @ 11:00 Nicki Reaper states he will bring the forms with him today and we can fax them in when complete. Nothing further needed.

## 2016-05-27 NOTE — Progress Notes (Signed)
PROBLEMS: Severe COPD Smoekr  INTERVAL HISTORY: Admitted 10/03-10/05/17 with AECOPD and acute on chronic hypercarbic respiratory failure Seen in office by DR 05/22/16  SUBJ: About at her (very poor) baseline. Still with severe, disabling dyspnea with any exertion. Despite this, continues to smoke 16 cigs per day. Trying to employ a strategy of reducing number of daily cigarettes gradually. She was told by DR that she was very end stage and Hospice was ordered. She and her husband have many questions re: prognosis, Hospice, her medications, who will prescribe her opioid analgesic meds, etc. She is presently on 20 mg prednisone daily   OBJ: Vitals:   05/27/16 1056  BP: (!) 160/100  Pulse: 94  SpO2: 91%  Weight: 151 lb 3.2 oz (68.6 kg)  Height: 5\' 8"  (1.727 m)   NAD HEENT WNL JVP not well visualized Distant BS, scattered coarse wheezes Reg, no M NABS, soft No LE edema   DATA: CXR 05/15/16: COPD changes, NAD  IMPRESSION: 1) Chronic respiratory failure with hypoxia and hypercapnia 2) recalcitrant smoker 3) COPD, severe - mostly chronic asthmatic bronchitis   PLAN: 1) Continue Spiriva, nebulized budesonide, nebulized arformetorol and PRN Duoneb 2) Cont Azithromycin 250 mg PO daily 3) Continue Prednisone @ 20 mg daily for now 4) Continue supplemental O2 - 2.5 liters continuous flow @ rest, 3 liters per minute with exertion. Try to turn it up 5 minutes prior to exertion and turn it down 5 minutes after stopping exertion 5) Continue nocturnal BiPAP 6) Counseled again and at length re: smoking cessation. Again, encouraged initiation of Chantix. She agrees to try this 7) ROV 3 weeks at which time we will discuss smoking cessation further, assess response and tolerance to Chantix, consider reduction in prednisone dose   Merton Border, MD PCCM service Mobile [phone] Pager [phone] 05/27/2016

## 2016-05-27 NOTE — Telephone Encounter (Signed)
Received denial [facility] for chantix 1mg . PA started thru [facility]. KEY:[id number]

## 2016-05-27 NOTE — Patient Instructions (Addendum)
1) we discussed smoking cessation - You have agreed to try Chantix 2) Regarding your oxygen - 2.5 liters per minute at rest, 3 liters per minute with exertion. Try to turn it up 5 minutes prior to exertion and turn it down 5 minutes after stopping exertion 3) we will notify [facility] to remove the oxygen conserving device 4) Continue nocturnal BiPAP 5) Rest of medications remain the same  6) Prednisone dose 20 mg daily until you return 7) Follow up in 3 weeks

## 2016-05-29 NOTE — Telephone Encounter (Signed)
Approvedon October 17  PA Case: [id number], Status: Approved, Coverage Starts on: 05/28/2016 12:00:00 AM, Coverage Ends on: 08/26/2016 12:00:00 AM.   Called and advised [facility] that Rx has been approved, they will contact patient once Rx has been filled. Nothing further needed.

## 2016-06-14 ENCOUNTER — Telehealth: Payer: Self-pay | Admitting: Internal Medicine

## 2016-06-14 NOTE — Telephone Encounter (Signed)
Traci with [facility] just calling to do a follow up on orders she sent Looks like we faxed back one out of three pages Please advise.

## 2016-06-14 NOTE — Telephone Encounter (Signed)
Form signed by DR and faxed back. Nothing further needed.

## 2016-06-17 ENCOUNTER — Encounter: Payer: Self-pay | Admitting: Pulmonary Disease

## 2016-06-17 ENCOUNTER — Ambulatory Visit (INDEPENDENT_AMBULATORY_CARE_PROVIDER_SITE_OTHER): Admitting: Pulmonary Disease

## 2016-06-17 VITALS — BP 142/80 | HR 89 | Wt 147.0 lb

## 2016-06-17 DIAGNOSIS — J449 Chronic obstructive pulmonary disease, unspecified: Secondary | ICD-10-CM | POA: Diagnosis not present

## 2016-06-17 DIAGNOSIS — Z23 Encounter for immunization: Secondary | ICD-10-CM | POA: Diagnosis not present

## 2016-06-17 DIAGNOSIS — F17200 Nicotine dependence, unspecified, uncomplicated: Secondary | ICD-10-CM

## 2016-06-17 NOTE — Patient Instructions (Addendum)
Decrease prednisone to 15 mg per day Continue rest of medications as is Follow up in 3 months

## 2016-06-22 NOTE — Progress Notes (Signed)
PROBLEMS: Severe COPD Smoker  INTERVAL HISTORY: No major events  SUBJ: No new complaints. Still with severe, dyspnea with exertion but feels this is somewhat better. Hospice is now involved and coming to her house approx 2x/wk. She continues to smoke approx 10 cigs per day. Wishes to reduce prednisone dose from her current dose of 20 mg/day.  Denies CP, fever, purulent sputum, hemoptysis, LE edema and calf tenderness  OBJ: Vitals:   06/17/16 1141  BP: (!) 142/80  Pulse: 89  SpO2: 94%  Weight: 147 lb (66.7 kg)  2.5 lpm [place]  NAD HEENT WNL JVP not well visualized Distant BS, slightly coarse, no wheezes Reg, no M NABS, soft No LE edema   DATA: CXR 05/15/16: COPD changes, NAD  IMPRESSION: 1) Chronic respiratory failure with hypoxia and hypercapnia 2) recalcitrant smoker 3) COPD, severe    PLAN: 1) Continue Spiriva, nebulized budesonide, nebulized arformetorol and PRN Duoneb 2) Cont Azithromycin 250 mg PO daily 3) Reuce Prednisone @ 15 mg daily  4) Continue supplemental O2 - 2.5 liters continuous flow @ rest, 3 liters per minute with exertion.  5) Continue nocturnal BiPAP 6) Counseled again re: smoking cessation.  7) ROV 3 months    Merton Border, MD PCCM service Mobile [phone] Pager [phone] 06/22/2016

## 2016-06-24 ENCOUNTER — Inpatient Hospital Stay (HOSPITAL_BASED_OUTPATIENT_CLINIC_OR_DEPARTMENT_OTHER): Payer: Medicare Other | Admitting: Hematology and Oncology

## 2016-06-24 ENCOUNTER — Inpatient Hospital Stay: Payer: Medicare Other

## 2016-06-24 ENCOUNTER — Inpatient Hospital Stay: Payer: Medicare Other | Attending: Hematology and Oncology

## 2016-06-24 VITALS — BP 165/96 | HR 89 | Temp 96.1°F | Resp 18 | Wt 150.4 lb

## 2016-06-24 DIAGNOSIS — F329 Major depressive disorder, single episode, unspecified: Secondary | ICD-10-CM | POA: Diagnosis not present

## 2016-06-24 DIAGNOSIS — M129 Arthropathy, unspecified: Secondary | ICD-10-CM | POA: Insufficient documentation

## 2016-06-24 DIAGNOSIS — K219 Gastro-esophageal reflux disease without esophagitis: Secondary | ICD-10-CM | POA: Diagnosis not present

## 2016-06-24 DIAGNOSIS — D3502 Benign neoplasm of left adrenal gland: Secondary | ICD-10-CM | POA: Diagnosis not present

## 2016-06-24 DIAGNOSIS — Z9981 Dependence on supplemental oxygen: Secondary | ICD-10-CM

## 2016-06-24 DIAGNOSIS — F419 Anxiety disorder, unspecified: Secondary | ICD-10-CM

## 2016-06-24 DIAGNOSIS — C679 Malignant neoplasm of bladder, unspecified: Secondary | ICD-10-CM | POA: Insufficient documentation

## 2016-06-24 DIAGNOSIS — J449 Chronic obstructive pulmonary disease, unspecified: Secondary | ICD-10-CM | POA: Insufficient documentation

## 2016-06-24 DIAGNOSIS — R300 Dysuria: Secondary | ICD-10-CM | POA: Insufficient documentation

## 2016-06-24 DIAGNOSIS — R42 Dizziness and giddiness: Secondary | ICD-10-CM

## 2016-06-24 DIAGNOSIS — N189 Chronic kidney disease, unspecified: Secondary | ICD-10-CM | POA: Insufficient documentation

## 2016-06-24 DIAGNOSIS — I129 Hypertensive chronic kidney disease with stage 1 through stage 4 chronic kidney disease, or unspecified chronic kidney disease: Secondary | ICD-10-CM | POA: Insufficient documentation

## 2016-06-24 DIAGNOSIS — F17210 Nicotine dependence, cigarettes, uncomplicated: Secondary | ICD-10-CM | POA: Insufficient documentation

## 2016-06-24 DIAGNOSIS — D3501 Benign neoplasm of right adrenal gland: Secondary | ICD-10-CM | POA: Diagnosis not present

## 2016-06-24 DIAGNOSIS — D649 Anemia, unspecified: Secondary | ICD-10-CM | POA: Diagnosis not present

## 2016-06-24 DIAGNOSIS — E876 Hypokalemia: Secondary | ICD-10-CM

## 2016-06-24 DIAGNOSIS — G629 Polyneuropathy, unspecified: Secondary | ICD-10-CM | POA: Insufficient documentation

## 2016-06-24 DIAGNOSIS — R0602 Shortness of breath: Secondary | ICD-10-CM | POA: Diagnosis not present

## 2016-06-24 DIAGNOSIS — E871 Hypo-osmolality and hyponatremia: Secondary | ICD-10-CM

## 2016-06-24 DIAGNOSIS — Z79899 Other long term (current) drug therapy: Secondary | ICD-10-CM | POA: Insufficient documentation

## 2016-06-24 DIAGNOSIS — K449 Diaphragmatic hernia without obstruction or gangrene: Secondary | ICD-10-CM | POA: Insufficient documentation

## 2016-06-24 DIAGNOSIS — D759 Disease of blood and blood-forming organs, unspecified: Secondary | ICD-10-CM

## 2016-06-24 DIAGNOSIS — D751 Secondary polycythemia: Secondary | ICD-10-CM | POA: Diagnosis not present

## 2016-06-24 DIAGNOSIS — M797 Fibromyalgia: Secondary | ICD-10-CM | POA: Diagnosis not present

## 2016-06-24 LAB — CBC WITH DIFFERENTIAL/PLATELET
Basophils Absolute: 0 10*3/uL (ref 0–0.1)
Basophils Relative: 0 %
Eosinophils Absolute: 0 10*3/uL (ref 0–0.7)
Eosinophils Relative: 0 %
HCT: 45.7 % (ref 35.0–47.0)
Hemoglobin: 15.2 g/dL (ref 12.0–16.0)
Lymphocytes Relative: 7 %
Lymphs Abs: 0.7 10*3/uL — ABNORMAL LOW (ref 1.0–3.6)
MCH: 30.6 pg (ref 26.0–34.0)
MCHC: 33.2 g/dL (ref 32.0–36.0)
MCV: 92 fL (ref 80.0–100.0)
Monocytes Absolute: 0.3 10*3/uL (ref 0.2–0.9)
Monocytes Relative: 3 %
Neutro Abs: 8.6 10*3/uL — ABNORMAL HIGH (ref 1.4–6.5)
Neutrophils Relative %: 90 %
Platelets: 288 10*3/uL (ref 150–440)
RBC: 4.97 MIL/uL (ref 3.80–5.20)
RDW: 16.1 % — ABNORMAL HIGH (ref 11.5–14.5)
WBC: 9.6 10*3/uL (ref 3.6–11.0)

## 2016-06-24 LAB — BASIC METABOLIC PANEL
Anion gap: 9 (ref 5–15)
BUN: 17 mg/dL (ref 6–20)
CO2: 35 mmol/L — ABNORMAL HIGH (ref 22–32)
Calcium: 9.2 mg/dL (ref 8.9–10.3)
Chloride: 90 mmol/L — ABNORMAL LOW (ref 101–111)
Creatinine, Ser: 0.81 mg/dL (ref 0.44–1.00)
GFR calc Af Amer: >60 mL/min (ref >60)
GFR calc non Af Amer: >60 mL/min (ref >60)
Glucose, Bld: 124 mg/dL — ABNORMAL HIGH (ref 65–99)
Potassium: 4.4 mmol/L (ref 3.5–5.1)
Sodium: 134 mmol/L — ABNORMAL LOW (ref 135–145)

## 2016-06-24 NOTE — Progress Notes (Signed)
BP elevated today.  States she ate chips and dip this weekend.  161/84 HR  Recheck 165/96 HR 89.
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
## 2016-07-08 ENCOUNTER — Other Ambulatory Visit: Payer: Self-pay | Admitting: Pulmonary Disease

## 2016-07-26 ENCOUNTER — Telehealth: Payer: Self-pay | Admitting: Pulmonary Disease

## 2016-07-26 ENCOUNTER — Other Ambulatory Visit: Payer: No Typology Code available for payment source

## 2016-07-26 ENCOUNTER — Encounter
Admission: RE | Admit: 2016-07-26 | Discharge: 2016-07-26 | Disposition: A | Discharge status: Home / Self Care | Payer: Medicare Other | Source: Ambulatory Visit | Attending: Urology | Admitting: Urology

## 2016-07-26 DIAGNOSIS — Z01818 Encounter for other preprocedural examination: Secondary | ICD-10-CM | POA: Insufficient documentation

## 2016-07-26 HISTORY — DX: Dependence on other enabling machines and devices: Z99.89

## 2016-07-26 HISTORY — DX: Myoneural disorder, unspecified: G70.9

## 2016-07-26 HISTORY — DX: Sleep apnea, unspecified: G47.30

## 2016-07-26 HISTORY — DX: Respiratory failure, unspecified, unspecified whether with hypoxia or hypercapnia: J96.90

## 2016-07-26 HISTORY — DX: Presence of other vascular implants and grafts: Z95.828

## 2016-07-26 HISTORY — DX: Atherosclerotic heart disease of native coronary artery without angina pectoris: I25.10

## 2016-07-26 NOTE — Patient Instructions (Addendum)
  Your procedure is scheduled on:08/23/2016 Report to Day Surgery. [facility]ICAL [facility] SECOND FLOOR To find out your arrival time please call [phone] between 1PM - 3PM on 07/20/2016  Remember: Instructions that are not followed completely may result in serious medical risk, up to and including death, or upon the discretion of your surgeon and anesthesiologist your surgery may need to be rescheduled.    __X__ 1. Do not eat food or drink liquids after midnight. No gum chewing or hard candies.     ____ 2. No Alcohol for 24 hours before or after surgery.   __X__ 3. Do Not Smoke For 24 Hours Prior to Your Surgery.   ____ 4. Bring all medications with you on the day of surgery if instructed.    __X__ 5. Notify your doctor if there is any change in your medical condition     (cold, fever, infections).       Do not wear jewelry, make-up, hairpins, clips or nail polish.  Do not wear lotions, powders, or perfumes. You may wear deodorant.  Do not shave 48 hours prior to surgery. Men may shave face and neck.  Do not bring valuables to the hospital.    [facility] is not responsible for any belongings or valuables.               Contacts, dentures or bridgework may not be worn into surgery.  Leave your suitcase in the car. After surgery it may be brought to your room.  For patients admitted to the hospital, discharge time is determined by your                treatment team.   Patients discharged the day of surgery will not be allowed to drive home.     _X___ Take these medicines the morning of surgery with A SIP OF WATER:    1. CYCLOBENAPRINE  2. VALIUM  3. PREDNISONE  4. OXYCODONE  5. ZANTAC AT BEDTIME 08/05/2016 AND AM SURGERY  6.  ____ Fleet Enema (as directed)   ____ Use CHG Soap as directed  _X_ Use inhalers on the day of surgery   DO NEB TREATMENT AM /   USE INHALERS AND BRING ____ Stop metformin 2 days prior to surgery    ____ Take 1/2 of usual insulin dose the night before  surgery and none on the morning of surgery.   ____ Stop Coumadin/Plavix/aspirin on   ____ Stop Anti-inflammatories on    ____ Stop supplements until after surgery.    __X__ Bring C-Pap to the hospital.

## 2016-07-26 NOTE — Pre-Procedure Instructions (Signed)
Faxed request to Dr. Yves Dill for H&P.

## 2016-07-26 NOTE — Pre-Procedure Instructions (Signed)
PULMONARY CLEARANCE REQUEST AS INSTRUCTED BY DR Amie Critchley, CALLED AND FAXED TO [facility]. DR WOLFF'S OFFICE CLOSED AND WILL NOTIFY WHEN OPEN

## 2016-07-26 NOTE — Telephone Encounter (Signed)
LM for sherry to return our call. Will await call back.

## 2016-07-26 NOTE — Telephone Encounter (Signed)
Anesthesia needs clearance  Letter for bladder surgery on 2016-08-27 Please call or return clearance fax to [phone]

## 2016-07-29 ENCOUNTER — Emergency Department

## 2016-07-29 ENCOUNTER — Encounter: Payer: Self-pay | Admitting: Emergency Medicine

## 2016-07-29 ENCOUNTER — Inpatient Hospital Stay
Admission: EM | Admit: 2016-07-29 | Discharge: 2016-08-12 | DRG: 190 | Disposition: E | Attending: Internal Medicine | Admitting: Internal Medicine

## 2016-07-29 ENCOUNTER — Other Ambulatory Visit: Payer: Self-pay

## 2016-07-29 DIAGNOSIS — Z9119 Patient's noncompliance with other medical treatment and regimen: Secondary | ICD-10-CM | POA: Diagnosis not present

## 2016-07-29 DIAGNOSIS — Z66 Do not resuscitate: Secondary | ICD-10-CM | POA: Diagnosis present

## 2016-07-29 DIAGNOSIS — G9341 Metabolic encephalopathy: Secondary | ICD-10-CM | POA: Diagnosis present

## 2016-07-29 DIAGNOSIS — F17210 Nicotine dependence, cigarettes, uncomplicated: Secondary | ICD-10-CM | POA: Diagnosis present

## 2016-07-29 DIAGNOSIS — Z8262 Family history of osteoporosis: Secondary | ICD-10-CM | POA: Diagnosis not present

## 2016-07-29 DIAGNOSIS — R0902 Hypoxemia: Secondary | ICD-10-CM | POA: Diagnosis present

## 2016-07-29 DIAGNOSIS — G8929 Other chronic pain: Secondary | ICD-10-CM | POA: Diagnosis present

## 2016-07-29 DIAGNOSIS — Z880 Allergy status to penicillin: Secondary | ICD-10-CM | POA: Diagnosis not present

## 2016-07-29 DIAGNOSIS — Z90710 Acquired absence of both cervix and uterus: Secondary | ICD-10-CM

## 2016-07-29 DIAGNOSIS — Z9981 Dependence on supplemental oxygen: Secondary | ICD-10-CM

## 2016-07-29 DIAGNOSIS — Z8371 Family history of colonic polyps: Secondary | ICD-10-CM | POA: Diagnosis not present

## 2016-07-29 DIAGNOSIS — M069 Rheumatoid arthritis, unspecified: Secondary | ICD-10-CM | POA: Diagnosis present

## 2016-07-29 DIAGNOSIS — Z79891 Long term (current) use of opiate analgesic: Secondary | ICD-10-CM

## 2016-07-29 DIAGNOSIS — Z7951 Long term (current) use of inhaled steroids: Secondary | ICD-10-CM

## 2016-07-29 DIAGNOSIS — Z91040 Latex allergy status: Secondary | ICD-10-CM

## 2016-07-29 DIAGNOSIS — J441 Chronic obstructive pulmonary disease with (acute) exacerbation: Principal | ICD-10-CM | POA: Diagnosis present

## 2016-07-29 DIAGNOSIS — Z886 Allergy status to analgesic agent status: Secondary | ICD-10-CM

## 2016-07-29 DIAGNOSIS — Z882 Allergy status to sulfonamides status: Secondary | ICD-10-CM | POA: Diagnosis not present

## 2016-07-29 DIAGNOSIS — K219 Gastro-esophageal reflux disease without esophagitis: Secondary | ICD-10-CM | POA: Diagnosis present

## 2016-07-29 DIAGNOSIS — J9601 Acute respiratory failure with hypoxia: Secondary | ICD-10-CM | POA: Diagnosis not present

## 2016-07-29 DIAGNOSIS — E785 Hyperlipidemia, unspecified: Secondary | ICD-10-CM | POA: Diagnosis present

## 2016-07-29 DIAGNOSIS — C674 Malignant neoplasm of posterior wall of bladder: Secondary | ICD-10-CM | POA: Diagnosis present

## 2016-07-29 DIAGNOSIS — Z825 Family history of asthma and other chronic lower respiratory diseases: Secondary | ICD-10-CM

## 2016-07-29 DIAGNOSIS — Z91041 Radiographic dye allergy status: Secondary | ICD-10-CM

## 2016-07-29 DIAGNOSIS — Z79899 Other long term (current) drug therapy: Secondary | ICD-10-CM | POA: Diagnosis not present

## 2016-07-29 DIAGNOSIS — F418 Other specified anxiety disorders: Secondary | ICD-10-CM | POA: Diagnosis present

## 2016-07-29 DIAGNOSIS — J9602 Acute respiratory failure with hypercapnia: Secondary | ICD-10-CM | POA: Diagnosis not present

## 2016-07-29 DIAGNOSIS — I2510 Atherosclerotic heart disease of native coronary artery without angina pectoris: Secondary | ICD-10-CM | POA: Diagnosis present

## 2016-07-29 DIAGNOSIS — J9690 Respiratory failure, unspecified, unspecified whether with hypoxia or hypercapnia: Secondary | ICD-10-CM | POA: Diagnosis present

## 2016-07-29 DIAGNOSIS — Z7952 Long term (current) use of systemic steroids: Secondary | ICD-10-CM

## 2016-07-29 DIAGNOSIS — Z515 Encounter for palliative care: Secondary | ICD-10-CM | POA: Diagnosis present

## 2016-07-29 LAB — BLOOD GAS, ARTERIAL
Allens test (pass/fail): POSITIVE — AB
Allens test (pass/fail): POSITIVE — AB
DELIVERY SYSTEMS: POSITIVE
EXPIRATORY PAP: 10
FIO2: 1
FIO2: 0.45
INSPIRATORY PAP: 20
Mechanical Rate: 20
PH ART: 7.19 — AB (ref 7.350–7.450)
PO2 ART: 77 mmHg — AB (ref 83.0–108.0)
Patient temperature: 37
Patient temperature: 37
pCO2 arterial: 120 mmHg (ref 32.0–48.0)
pCO2 arterial: 120 mmHg (ref 32.0–48.0)
pH, Arterial: 7.12 — CL (ref 7.350–7.450)
pO2, Arterial: 55 mmHg — ABNORMAL LOW (ref 83.0–108.0)

## 2016-07-29 LAB — CBC
HCT: 48.4 % — ABNORMAL HIGH (ref 35.0–47.0)
Hemoglobin: 15.6 g/dL (ref 12.0–16.0)
MCH: 30.5 pg (ref 26.0–34.0)
MCHC: 32.2 g/dL (ref 32.0–36.0)
MCV: 95 fL (ref 80.0–100.0)
PLATELETS: 256 10*3/uL (ref 150–440)
RBC: 5.1 MIL/uL (ref 3.80–5.20)
RDW: 17.6 % — ABNORMAL HIGH (ref 11.5–14.5)
WBC: 12.2 10*3/uL — AB (ref 3.6–11.0)

## 2016-07-29 LAB — COMPREHENSIVE METABOLIC PANEL
ALT: 12 U/L — AB (ref 14–54)
AST: 19 U/L (ref 15–41)
Albumin: 3.7 g/dL (ref 3.5–5.0)
Alkaline Phosphatase: 79 U/L (ref 38–126)
Anion gap: 4 — ABNORMAL LOW (ref 5–15)
BUN: 25 mg/dL — ABNORMAL HIGH (ref 6–20)
CALCIUM: 9.1 mg/dL (ref 8.9–10.3)
CHLORIDE: 85 mmol/L — AB (ref 101–111)
CO2: 44 mmol/L — ABNORMAL HIGH (ref 22–32)
CREATININE: 1.28 mg/dL — AB (ref 0.44–1.00)
GFR, EST AFRICAN AMERICAN: 49 mL/min — AB (ref >60)
GFR, EST NON AFRICAN AMERICAN: 43 mL/min — AB (ref >60)
Glucose, Bld: 123 mg/dL — ABNORMAL HIGH (ref 65–99)
Potassium: 5 mmol/L (ref 3.5–5.1)
Sodium: 133 mmol/L — ABNORMAL LOW (ref 135–145)
TOTAL PROTEIN: 7.5 g/dL (ref 6.5–8.1)
Total Bilirubin: 0.2 mg/dL — ABNORMAL LOW (ref 0.3–1.2)

## 2016-07-29 LAB — TROPONIN I: TROPONIN I: 0.03 ng/mL — AB (ref <0.03)

## 2016-07-29 LAB — GLUCOSE, CAPILLARY: GLUCOSE-CAPILLARY: 101 mg/dL — AB (ref 65–99)

## 2016-07-29 LAB — BRAIN NATRIURETIC PEPTIDE: B NATRIURETIC PEPTIDE 5: 100 pg/mL (ref 0.0–100.0)

## 2016-07-29 LAB — MRSA PCR SCREENING: MRSA by PCR: NEGATIVE

## 2016-07-29 MED ORDER — ENOXAPARIN SODIUM 40 MG/0.4ML ~~LOC~~ SOLN: 40.0000 mg | SUBCUTANEOUS | Status: DC

## 2016-07-29 MED ORDER — METHYLPREDNISOLONE SODIUM SUCC 40 MG IJ SOLR: 20.0000 mg | Freq: Every day | INTRAMUSCULAR | Status: DC

## 2016-07-29 MED ORDER — DEXTROSE 5 % IV SOLN: 500.0000 mg | Freq: Every day | INTRAVENOUS | Status: DC

## 2016-07-29 MED ORDER — FAMOTIDINE IN NACL 20-0.9 MG/50ML-% IV SOLN: 20.0000 mg | Freq: Two times a day (BID) | INTRAVENOUS | Status: DC

## 2016-07-29 MED ORDER — SODIUM CHLORIDE 0.9 % IV SOLN: 250.0000 mL | INTRAVENOUS | Status: DC | PRN

## 2016-07-29 MED ORDER — MORPHINE 100MG IN NS 100ML (1MG/ML) PREMIX INFUSION
5.0000 mg/h | INTRAVENOUS | Status: DC
  Administered 2016-07-29: 10 mg/h via INTRAVENOUS
  Administered 2016-07-29 – 2016-07-30 (×4): 20 mg/h via INTRAVENOUS
  Filled 2016-07-29 (×5): qty 100

## 2016-07-29 MED ORDER — IPRATROPIUM-ALBUTEROL 0.5-2.5 (3) MG/3ML IN SOLN: 3.0000 mL | RESPIRATORY_TRACT | Status: DC

## 2016-07-29 MED ORDER — CHLORHEXIDINE GLUCONATE 0.12 % MT SOLN: 15.0000 mL | Freq: Two times a day (BID) | OROMUCOSAL | Status: DC

## 2016-07-29 MED ORDER — MORPHINE BOLUS VIA INFUSION
5.0000 mg | INTRAVENOUS | Status: DC | PRN
  Administered 2016-07-29 – 2016-07-30 (×8): 20 mg via INTRAVENOUS
  Filled 2016-07-29: qty 20

## 2016-07-29 MED ORDER — OXYCODONE HCL 5 MG PO TABS
30.0000 mg | ORAL_TABLET | ORAL | Status: DC | PRN
  Administered 2016-07-29: 30 mg via ORAL
  Filled 2016-07-29: qty 6

## 2016-07-29 MED ORDER — ONDANSETRON HCL 4 MG/2ML IJ SOLN
4.0000 mg | Freq: Four times a day (QID) | INTRAMUSCULAR | Status: DC | PRN
Start: 2016-07-29 — End: 2016-07-30

## 2016-07-29 MED ORDER — LORAZEPAM 2 MG/ML IJ SOLN
2.0000 mg | INTRAMUSCULAR | Status: DC | PRN
  Administered 2016-07-29 – 2016-07-30 (×2): 2 mg via INTRAVENOUS
  Filled 2016-07-29 (×2): qty 1

## 2016-07-29 MED ORDER — ACETAMINOPHEN 325 MG PO TABS: 650.0000 mg | ORAL_TABLET | ORAL | Status: DC | PRN

## 2016-07-29 MED ORDER — MORPHINE SULFATE (PF) 4 MG/ML IV SOLN: 2.0000 mg | INTRAVENOUS | Status: DC | PRN

## 2016-07-29 MED ORDER — ALPRAZOLAM 0.5 MG PO TABS: 0.5000 mg | ORAL_TABLET | Freq: Three times a day (TID) | ORAL | Status: DC | PRN

## 2016-07-29 MED ORDER — SODIUM CHLORIDE 0.9% FLUSH
3.0000 mL | Freq: Two times a day (BID) | INTRAVENOUS | Status: DC
  Administered 2016-07-29: 3 mL via INTRAVENOUS

## 2016-07-29 MED ORDER — BUDESONIDE 0.5 MG/2ML IN SUSP: 0.5000 mg | Freq: Two times a day (BID) | RESPIRATORY_TRACT | Status: DC

## 2016-07-29 MED ORDER — SODIUM CHLORIDE 0.9% FLUSH: 3.0000 mL | INTRAVENOUS | Status: DC | PRN

## 2016-07-29 MED ORDER — ORAL CARE MOUTH RINSE: 15.0000 mL | Freq: Two times a day (BID) | OROMUCOSAL | Status: DC

## 2016-07-29 MED ORDER — HYDROXYZINE HCL 25 MG PO TABS
25.0000 mg | ORAL_TABLET | Freq: Three times a day (TID) | ORAL | Status: DC | PRN
  Administered 2016-07-29: 25 mg via ORAL
  Filled 2016-07-29: qty 1
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
## 2016-07-29 NOTE — ED Notes (Signed)
Pt responsive to painful stimuli, talking to this nurse and family.

## 2016-07-29 NOTE — ED Triage Notes (Signed)
Pt to ed via ems from home with DNR in place with reports of taking her bipap off during the night and oxygen dropping down in the 60's. Ems reports [placing pt on 2 liters of o2 with sats 96%. Pt currently on hospice care for end stage COPD. Pt arrives ams with NRB in place 97%, p96, 158/75. edp at bedside.

## 2016-07-29 NOTE — Telephone Encounter (Signed)
Spoke with Judeen Hammans in [place]. They are needing a letter clearing the pt for surgery tomorrow.  Dr. Alva Garnet - based on your last OV, is the pt cleared for surgery? Thanks.

## 2016-07-29 NOTE — ED Notes (Signed)
Attempted to call report to floor.  Nurse unavailable to take report at this time.  Gave my call back number.

## 2016-07-29 NOTE — ED Notes (Signed)
ABG PH 7.12, CO2 greater than 120. Bipap ordered.

## 2016-07-29 NOTE — Progress Notes (Signed)
ED visit made. Patient is currently followed by Hospice and [place] at home with a hospice diagnosis of Chronic Obstructive Pulmonary Disease. She is a DNR code with an out of facility DNR in place. Patient was sent to the [facility] ED for assessment of altered mental status and decreased oxygen saturations. She has Bipap in the home and per report of family she had taken it off during the night. Her home Bipap was placed on her and her oxygen saturations only rose to the 70's, family chose to have patient transported to the Ed for evaluation. Patient seen lying on the ED stretcher, Bipap in place, per chart review her pCO2 level was 120 on arrival. She remains unresponsive to voice. Daughter [name] at bedside, she reported that the goal is to see if the Bipap works, she reported that "usually it turns her around". She feels that the Bipap at the hospital is more effective than the Bipap her mother has at home. She also reports that it (home Bipap)  has been adjusted several times and new masks have been tried. [name] also expressed her concern regarding her mother's chronic pain, per her [name] her mother takes oxycodone 30 mg q 4 hrs at home and her last dose was at 7 am. Attending ED physician Dr. Darl Householder made aware. Also of note patient was scheduled to have a tumor removed from her bladder tomorrow 12/19. Plan is for admission to the ICU. Support given, will continue to follow and update hospice team. Hospital care team made aware that patient is currently followed by [place]. Thank you. Marylee Floras RN, BSN, [facility] [place], hospital Liaison [phone] c
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
## 2016-07-29 NOTE — Progress Notes (Signed)
After further evaluation and discussion with family, the husband and daughter have agreed to comfort care measures.despite aggressive BiPAP therapy, patient has elevated pco2 >120 and encephalopathy.  Will place comfort care orderes.   Family are satisfied with Plan of action and management. All questions answered  Corrin Parker, M.D.  [facility]  Medical Director [place] Director [facility] Cardio-Pulmonary Department

## 2016-07-29 NOTE — ED Notes (Signed)
PT CURRENTLY ON BIPAP. DAUGHTER AND HOSPICE RN AT BEDSIDE.

## 2016-07-29 NOTE — Telephone Encounter (Signed)
Pt is apparently in ED right now with hypoxic respiratory failure. So...no, not cleared.  Rebecca Holland

## 2016-07-29 NOTE — Progress Notes (Signed)
Patient's pain continues to be uncontrolled, Elink paged.

## 2016-07-29 NOTE — ED Notes (Signed)
Daughter at bedside, states frequently takes her BIPAP machine off at night and ends up with a high Co2 and in the hospital.  Pt is sch to have a tumor removed from her bladder tomorrow. Pt continues with no response verbally, heart rate and BP normal.

## 2016-07-29 NOTE — ED Notes (Signed)
CRITICAL CO2 OF 120 AND PH OF 7.12 REPORTED VERBALLY TO DR Darl Householder.

## 2016-07-29 NOTE — Progress Notes (Signed)
No call back from elink, Hinton Dyer, NP on floor and notified of uncontrolled pain. Home medications ordered since patient is awake and alert (oriented x4) enough to take POs. Wilnette Kales

## 2016-07-29 NOTE — Progress Notes (Signed)
Chaplain rounded the unit to provide a compassionate presence and spiritual support to the patient and family. Chaplain, patient and family joined hands during prayer. Minerva Fester [phone]
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
## 2016-07-29 NOTE — Telephone Encounter (Signed)
Called and spoke with Judeen Hammans. She is aware of Dr. Alva Garnet response. Nothing further was needed at this time.

## 2016-07-29 NOTE — ED Notes (Signed)
Pt remains on BIPAP with no change in mental status. Plans to admit pt at this time.

## 2016-07-29 NOTE — Progress Notes (Signed)
Pt. Was taken off bipap to comfort care.

## 2016-07-29 NOTE — Progress Notes (Signed)
Patient c/o pain with no relief from morphine gtt, Elink notified and changed morphine order so it can be titrated for patient comfort. Order also received to place foley for end of life care. Foley placed. Wilnette Kales

## 2016-07-30 ENCOUNTER — Ambulatory Visit: Admission: RE | Admit: 2016-07-30 | Payer: Medicare Other | Source: Ambulatory Visit | Admitting: Urology

## 2016-07-30 ENCOUNTER — Encounter: Admission: RE | Payer: Self-pay | Source: Ambulatory Visit

## 2016-07-30 ENCOUNTER — Inpatient Hospital Stay

## 2016-07-30 SURGERY — TURBT (TRANSURETHRAL RESECTION OF BLADDER TUMOR)
Anesthesia: Choice

## 2016-07-30 MED ORDER — MORPHINE BOLUS VIA INFUSION
5.0000 mg | INTRAVENOUS | Status: DC | PRN
  Filled 2016-07-30: qty 20

## 2016-07-30 MED ORDER — HYDROMORPHONE HCL 1 MG/ML IJ SOLN: 2.0000 mg | INTRAMUSCULAR | Status: DC | PRN

## 2016-07-30 MED ORDER — MORPHINE SULFATE (PF) 4 MG/ML IV SOLN: 2.0000 mg | INTRAVENOUS | Status: DC | PRN

## 2016-08-02 ENCOUNTER — Other Ambulatory Visit: Payer: Self-pay | Admitting: Nurse Practitioner

## 2016-08-06 ENCOUNTER — Telehealth: Payer: Self-pay | Admitting: Internal Medicine

## 2016-08-06 NOTE — Telephone Encounter (Signed)
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 Lesleigh Noe is aware the certificate is in the mailbox in the front office.

## 2016-08-06 NOTE — Telephone Encounter (Signed)
I have made DK aware that we have received this death certificate. DK will be in on Friday to sign

## 2016-08-07 ENCOUNTER — Encounter: Payer: Self-pay | Admitting: Hematology and Oncology

## 2016-08-12 NOTE — Progress Notes (Signed)
Chaplain rounded the unit to provide a compassionate presence and support to the family. Chaplain Cristy Colmenares [phone] 

## 2016-08-12 NOTE — Discharge Summary (Signed)
DEATH SUMMARY   Name: Rebecca Holland MRN: [id number] DOB: May 16, 1951    ADMISSION DATE:  08/09/2016  CHIEF COMPLAINT:acute resp failure  INITIAL PRESENTATION: resp distress   ADMIT DX COPD EXACERBATION, RESP FAILURE DEATH DX COPD  After further evaluation and discussion with family, the husband and daughter have agreed to comfort care measures.despite aggressive BiPAP therapy, patient has elevated pco2 >120 and encephalopathy. Patient with end stage COPD   Patient expired at 0913, on 2016/08/22. pronounced by this nurse and Duwayne Heck, RN. Family present at bedside.  [place] donors notified, service number [phone]. Forensic psychologist notified.      Corrin Parker, M.D.  [facility]  Medical Director [place] Director [facility] Cardio-Pulmonary Department

## 2016-08-12 NOTE — Progress Notes (Addendum)
Patient expired at 0913, pronounced by this nurse and Duwayne Heck, RN. Family present at bedside. [place] donors notified Rebecca Holland), service number [phone]. Forensic psychologist notified. Dr. Mortimer Fries notified.  Rebecca Holland

## 2016-08-12 NOTE — H&P (Signed)
DEATH SUMMARY   Name: Rebecca Holland MRN: [id number] DOB: 03-Mar-1951    ADMISSION DATE:  07/14/2016  CHIEF COMPLAINT:acute resp failure  INITIAL PRESENTATION: resp distress   ADMIT DX COPD EXACERBATION, RESP FAILURE DEATH DX COPD  After further evaluation and discussion with family, the husband and daughter have agreed to comfort care measures.despite aggressive BiPAP therapy, patient has elevated pco2 >120 and encephalopathy. Patient with end stage COPD   Patient expired at 0913, on 2016/08/24. pronounced by this nurse and Duwayne Heck, RN. Family present at bedside.  [place] donors notified, service number [phone]. Forensic psychologist notified.      Corrin Parker, M.D.  [facility]  Medical Director [place] Director [facility] Cardio-Pulmonary Department

## 2016-08-12 DEATH — deceased

## 2016-09-11 ENCOUNTER — Ambulatory Visit: Payer: Medicare Other | Admitting: Radiation Oncology

## 2016-09-27 ENCOUNTER — Other Ambulatory Visit: Payer: No Typology Code available for payment source

## 2016-09-27 ENCOUNTER — Ambulatory Visit: Payer: No Typology Code available for payment source | Admitting: Hematology and Oncology

## 2017-05-30 IMAGING — CT CT ABD-PELV W/O CM
1 of 2 series · 14 of 32 positions shown, 18 images · non-contrast
Comparison: 12/26/2014.

CLINICAL DATA: Subsequent encounter for bladder cancer.

EXAM:
CT ABDOMEN AND PELVIS WITHOUT CONTRAST
TECHNIQUE: Multidetector CT imaging of the abdomen and pelvis was performed
following the standard protocol without IV contrast.

[Series 2: routine abd pel without · axial · non-contrast · 0.72mm/px · z∈[-736,-342]mm · 14 of 90 slices shown, 18 images]
[im 7/90  soft-tissue]
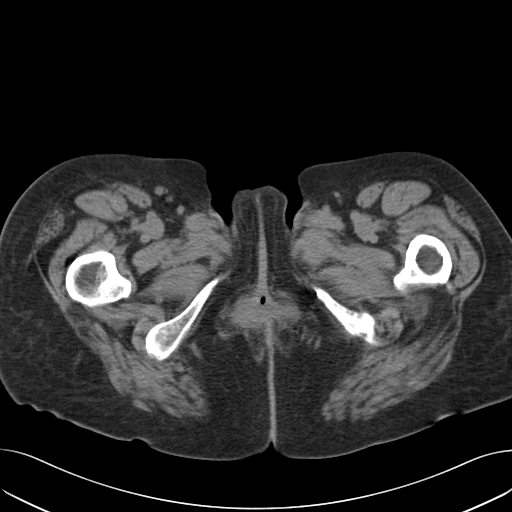
[im 7/90  bone]
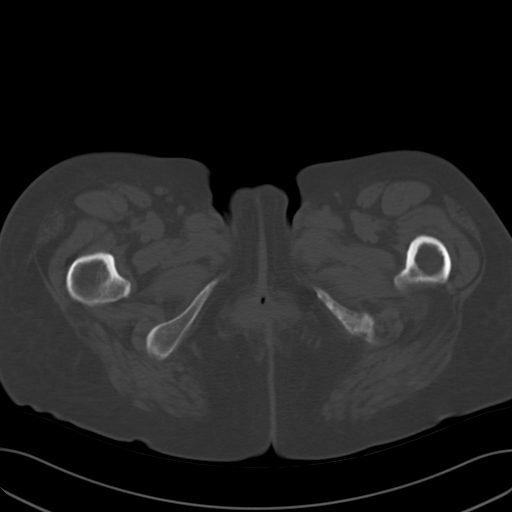
[im 14/90  soft-tissue]
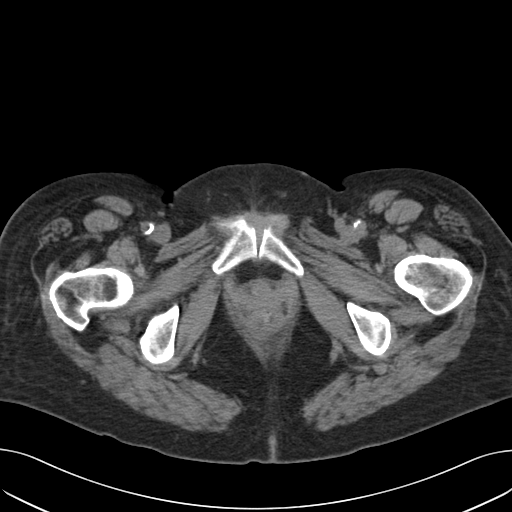
[im 21/90  soft-tissue]
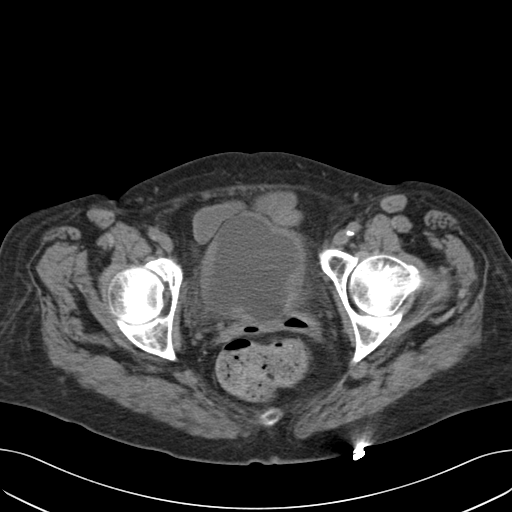
[im 28/90  soft-tissue]
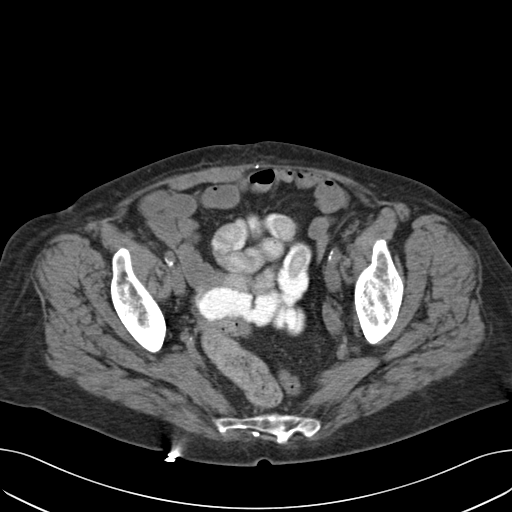
[im 35/90  soft-tissue]
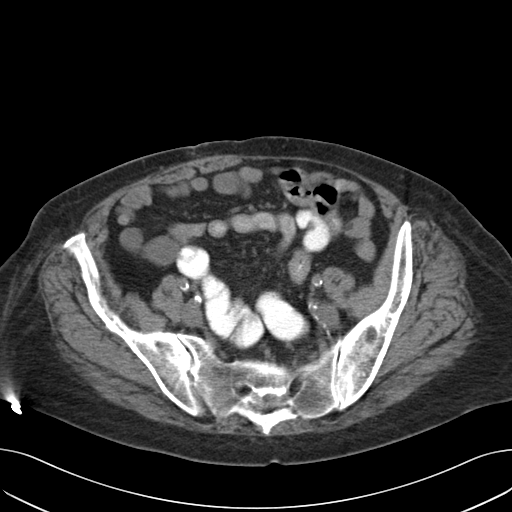
[im 42/90  soft-tissue]
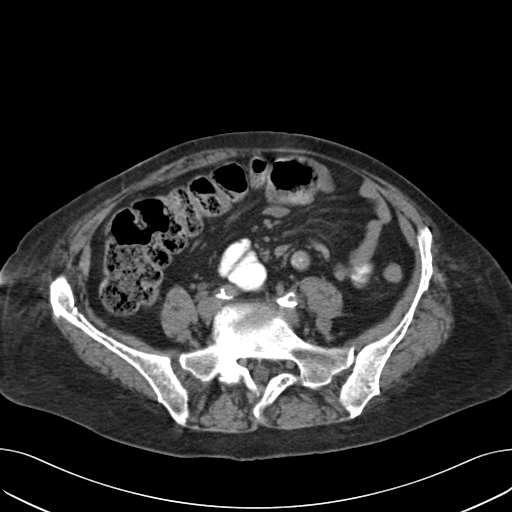
[im 48/90  soft-tissue]
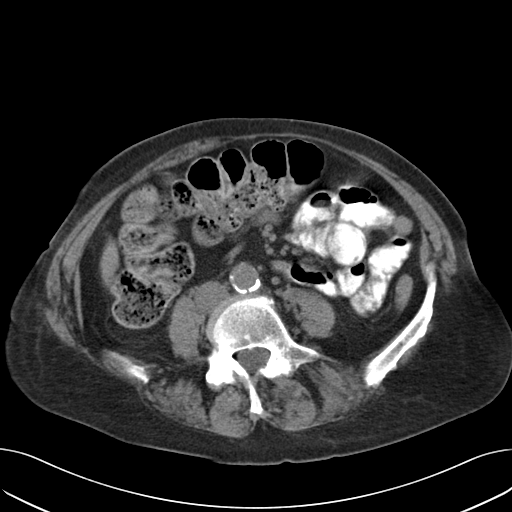
[im 55/90  soft-tissue]
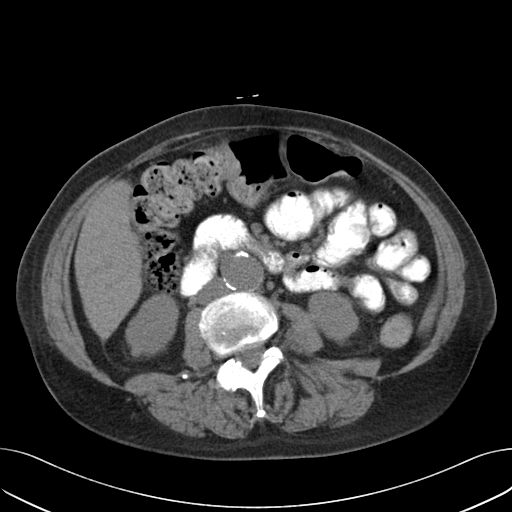
[im 62/90  soft-tissue]
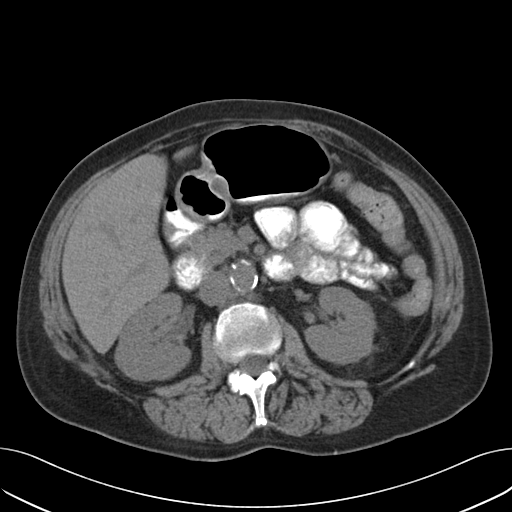
[im 62/90  bone]
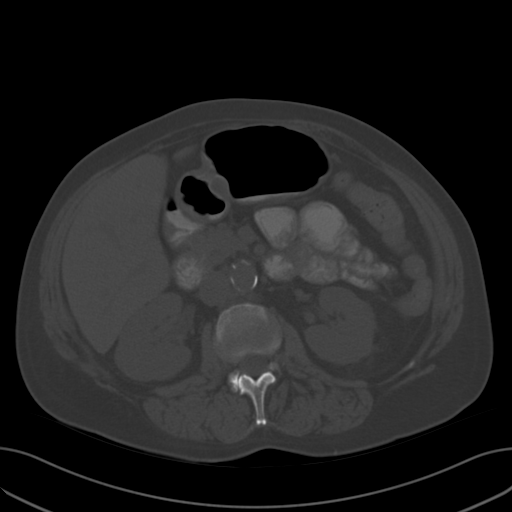
[im 69/90  soft-tissue]
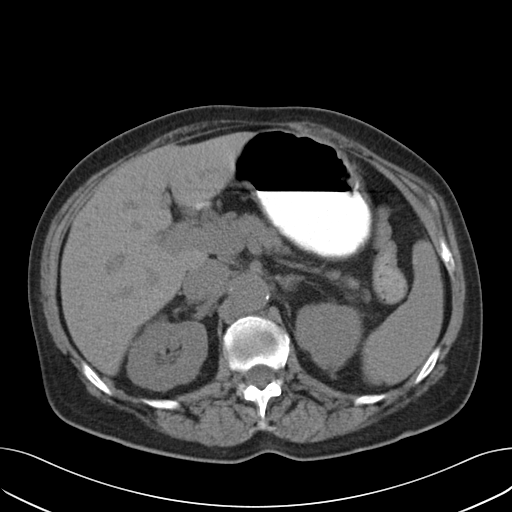
[im 76/90  soft-tissue]
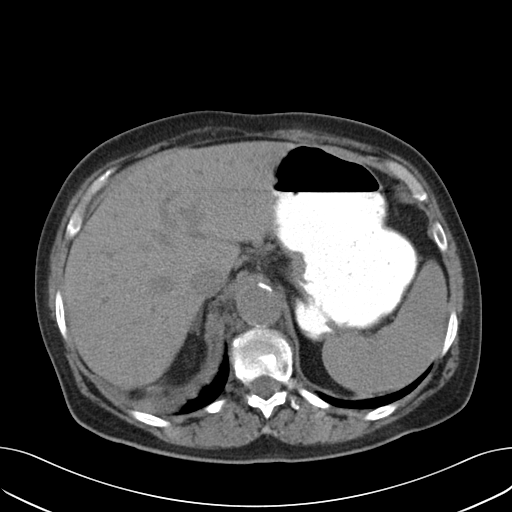
[im 76/90  lung]
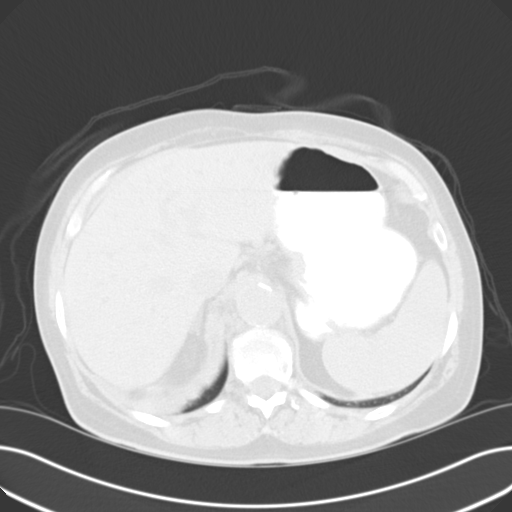
[im 79/90  lung]
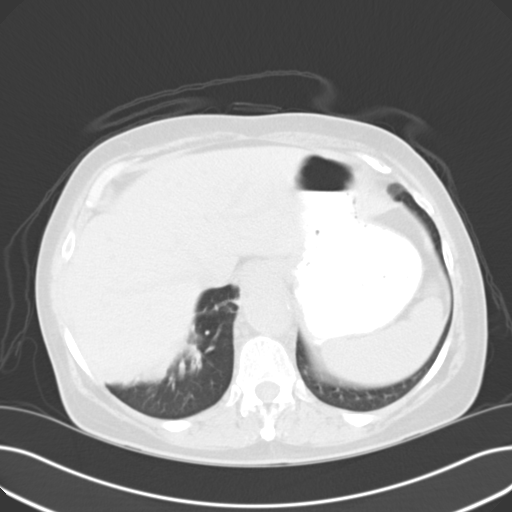
[im 83/90  soft-tissue]
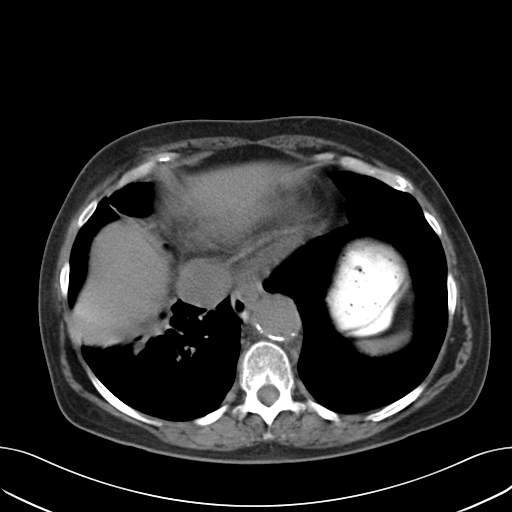
[im 83/90  lung]
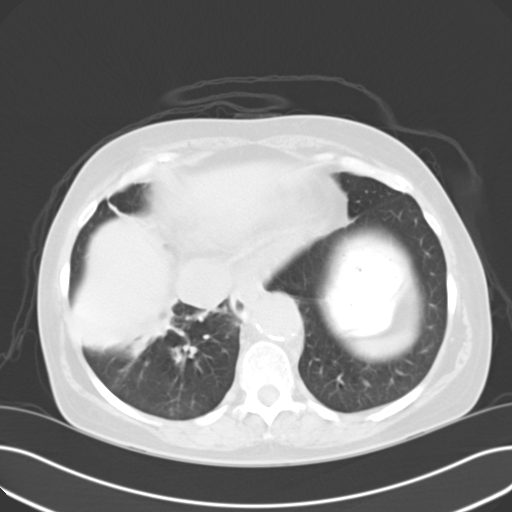
[im 86/90  lung]
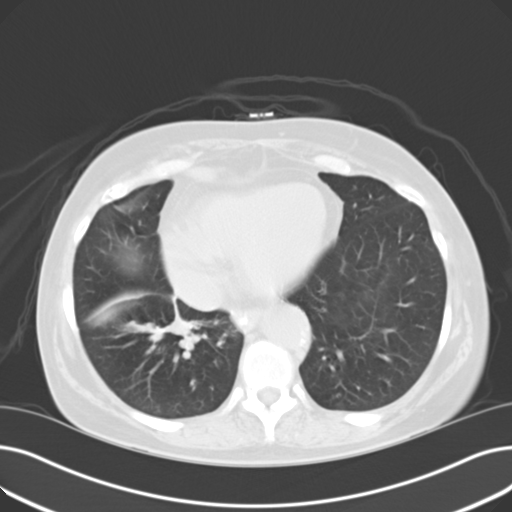

[14 of 32 positions shown; findings below may reference images not displayed]

FINDINGS: Lower chest: Central soft tissue attenuation is seen around
subsegmental airways to the right lower lobe. This is new in the
interval and is probably infectious/inflammatory or related to
atelectasis. Some of the airways in the same region appeared
impacted and aspiration would be a consideration.

Hepatobiliary: No focal abnormality in the liver on this study
without intravenous contrast. No evidence of hepatomegaly. Patient
is status post cholecystectomy. No intrahepatic or extrahepatic
biliary dilation.

Pancreas: No focal mass lesion. No dilatation of the main duct. No
intraparenchymal cyst. No peripancreatic edema.

Spleen: No splenomegaly. No focal mass lesion.

Adrenals/Urinary Tract: 2.5 cm left adrenal nodule is stable with
attenuation low enough to be characterized as benign adrenal
adenoma. 13 mm right adrenal adenoma is also stable. No
hydronephrosis in either kidney. Stable 12-13 mm slightly exophytic
lesion from the medial aspect of the right upper kidney. No evidence
for hydroureter. Asymmetric bladder wall thickening is evident,
right greater than left. There is high attenuation material along
the mucosa of the left bladder wall which may be related to
mineralization or surgical material.

Stomach/Bowel: Stomach is nondistended. No gastric wall thickening.
No evidence of outlet obstruction. Duodenum is normally positioned
as is the ligament of Treitz. No small bowel wall thickening. No
small bowel dilatation. Cecal tip is mobile and is positioned in the
midline upper anatomic pelvis. The terminal ileum is normal. The
appendix is not visualized, but there is no edema or inflammation in
the region of the cecum. No gross colonic mass lesion. No evidence
for colonic diverticulitis.

Vascular/Lymphatic: There is abdominal aortic atherosclerosis
maximum aortic diameter at 3.3 cm. There is no gastrohepatic or
hepatoduodenal ligament lymphadenopathy. No intraperitoneal or
retroperitoneal lymphadenopy. No mesenteric or pelvic sidewall
lymphadenopathy.

Reproductive: Patient is status post hysterectomy. There is no
adnexal mass.

Other: No intraperitoneal free fluid.

Musculoskeletal: Bone windows reveal no worrisome lytic or sclerotic
osseous lesions.
IMPRESSION: 1. Asymmetric bladder wall thickening involving the right bladder
wall in this patient with a history of bladder neoplasm. No evidence
for metastatic disease in the abdomen or pelvis.
2. Incompletely visualized abnormal soft tissue attenuation in the
central right lower lobe with associated airway impaction. Imaging
features may be related to aspiration given the location with
associated atelectasis. Infectious or inflammatory process would
also be a consideration, but considered less likely.
3. Stable appearance of bilateral benign adrenal adenomata.
4. Abdominal aortic aneurysm. Recommend followup by ultrasound in 3
years. This recommendation follows ACR consensus guidelines: White
Paper of the ACR Incidental Findings Committee II on Vascular
Findings. [HOSPITAL] 6034; [DATE]
